# Patient Record
Sex: Male | Born: 1955 | Race: Black or African American | Hispanic: No | Smoking: Never smoker
Health system: Southern US, Community
[De-identification: ages and names within clinical notes are randomized; demographics above are authoritative.]

## PROBLEM LIST (undated history)

## (undated) ENCOUNTER — Emergency Department (HOSPITAL_COMMUNITY): Admission: EM | Payer: Medicaid Other | Source: Home / Self Care

## (undated) DIAGNOSIS — F1010 Alcohol abuse, uncomplicated: Secondary | ICD-10-CM

## (undated) DIAGNOSIS — I10 Essential (primary) hypertension: Secondary | ICD-10-CM

## (undated) DIAGNOSIS — I639 Cerebral infarction, unspecified: Secondary | ICD-10-CM

## (undated) HISTORY — PX: EYE SURGERY: SHX253

---

## 2001-01-12 ENCOUNTER — Emergency Department (HOSPITAL_COMMUNITY): Admission: EM | Admit: 2001-01-12 | Discharge: 2001-01-12 | Payer: Self-pay | Admitting: *Deleted

## 2001-01-21 ENCOUNTER — Encounter (HOSPITAL_COMMUNITY): Admission: RE | Admit: 2001-01-21 | Discharge: 2001-02-20 | Payer: Self-pay | Admitting: General Surgery

## 2003-08-24 ENCOUNTER — Inpatient Hospital Stay (HOSPITAL_COMMUNITY): Admission: EM | Admit: 2003-08-24 | Discharge: 2003-08-28 | Payer: Self-pay | Admitting: Emergency Medicine

## 2003-08-30 ENCOUNTER — Encounter (HOSPITAL_COMMUNITY): Admission: RE | Admit: 2003-08-30 | Discharge: 2003-09-29 | Payer: Self-pay | Admitting: Internal Medicine

## 2003-10-02 ENCOUNTER — Encounter (HOSPITAL_COMMUNITY): Admission: RE | Admit: 2003-10-02 | Discharge: 2003-11-01 | Payer: Self-pay | Admitting: Internal Medicine

## 2006-01-16 ENCOUNTER — Inpatient Hospital Stay (HOSPITAL_COMMUNITY): Admission: EM | Admit: 2006-01-16 | Discharge: 2006-01-20 | Payer: Self-pay | Admitting: Emergency Medicine

## 2006-04-11 ENCOUNTER — Emergency Department (HOSPITAL_COMMUNITY): Admission: EM | Admit: 2006-04-11 | Discharge: 2006-04-12 | Payer: Self-pay | Admitting: Emergency Medicine

## 2014-07-14 ENCOUNTER — Encounter (HOSPITAL_COMMUNITY): Payer: Self-pay | Admitting: *Deleted

## 2014-07-14 ENCOUNTER — Emergency Department (HOSPITAL_COMMUNITY)
Admission: EM | Admit: 2014-07-14 | Discharge: 2014-07-14 | Disposition: A | Discharge status: Home / Self Care | Payer: Medicaid Other | Attending: Emergency Medicine | Admitting: Emergency Medicine

## 2014-07-14 DIAGNOSIS — L84 Corns and callosities: Secondary | ICD-10-CM | POA: Diagnosis not present

## 2014-07-14 DIAGNOSIS — Z720 Tobacco use: Secondary | ICD-10-CM | POA: Insufficient documentation

## 2014-07-14 DIAGNOSIS — M79671 Pain in right foot: Secondary | ICD-10-CM | POA: Diagnosis present

## 2014-07-14 DIAGNOSIS — I10 Essential (primary) hypertension: Secondary | ICD-10-CM | POA: Diagnosis not present

## 2014-07-14 HISTORY — DX: Essential (primary) hypertension: I10

## 2014-07-14 LAB — CBG MONITORING, ED: GLUCOSE-CAPILLARY: 92 mg/dL (ref 70–99)

## 2014-07-14 NOTE — ED Notes (Signed)
Patient given discharge instruction, verbalized understand. Patient ambulatory out of the department.  

## 2014-07-14 NOTE — ED Notes (Addendum)
Pain rt  Foot for 2-3 weeks

## 2014-07-14 NOTE — Discharge Instructions (Signed)
Corns and Calluses Corns are small areas of thickened skin that usually occur on the top, sides, or tip of a toe. They contain a cone-shaped core with a point that can press on a nerve below. This causes pain. Calluses are areas of thickened skin that usually develop on hands, fingers, palms, soles of the feet, and heels. These are areas that experience frequent friction or pressure. CAUSES  Corns are usually the result of rubbing (friction) or pressure from shoes that are too tight or do not fit properly. Calluses are caused by repeated friction and pressure on the affected areas. SYMPTOMS  A hard growth on the skin.  Pain or tenderness under the skin.  Sometimes, redness and swelling.  Increased discomfort while wearing tight-fitting shoes. DIAGNOSIS  Your caregiver can usually tell what the problem is by doing a physical exam. TREATMENT  Removing the cause of the friction or pressure is usually the only treatment needed. However, sometimes medicines can be used to help soften the hardened, thickened areas. These medicines include salicylic acid plasters and 12% ammonium lactate lotion. These medicines should only be used under the direction of your caregiver. HOME CARE INSTRUCTIONS   Try to remove pressure from the affected area.  You may wear donut-shaped corn pads to protect your skin.  You may use a pumice stone or nonmetallic nail file to gently reduce the thickness of a corn.  Wear properly fitted footwear.  If you have calluses on the hands, wear gloves during activities that cause friction.  If you have diabetes, you should regularly examine your feet. Tell your caregiver if you notice any problems with your feet. SEEK IMMEDIATE MEDICAL CARE IF:   You have increased pain, swelling, redness, or warmth in the affected area.  Your corn or callus starts to drain fluid or bleeds.  You are not getting better, even with treatment. Document Released: 12/29/2003 Document  Revised: 06/16/2011 Document Reviewed: 11/19/2010 Exit[employer] Patient Information 2015 [employer]. This information is not intended to replace advice given to you by your health care provider. Make sure you discuss any questions you have with your health care provider.  

## 2014-07-16 NOTE — ED Provider Notes (Signed)
CSN: [id number]     Arrival date & time 07/14/14  1717 History   First MD Initiated Contact with Patient 07/14/14 1739     Chief Complaint  Patient presents with  . Foot Pain     (Consider location/radiation/quality/duration/timing/severity/associated sxs/prior Treatment) HPI   Gregory Parker is a 59 y.o. male who presents to the Emergency Department complaining of painful areas to both feet for several weeks.  He states the pain to the right foot is the worse and reports a "knot" on his foot that causes pain with weight bearing.  He states the pain is so bad that he has difficulty walking.  He denies trying any therapies for symptom relief, swelling, redness, or numbness of his foot.     Past Medical History  Diagnosis Date  . Hypertension    Past Surgical History  Procedure Laterality Date  . Eye surgery     History reviewed. No pertinent family history. History  Substance Use Topics  . Smoking status: Current Every Day Smoker  . Smokeless tobacco: Not on file  . Alcohol Use: Yes    Review of Systems  Constitutional: Negative for fever and chills.  Genitourinary: Negative for dysuria and difficulty urinating.  Musculoskeletal: Positive for arthralgias (bilateral foot pain). Negative for joint swelling.  Skin: Negative for color change and wound.  All other systems reviewed and are negative.     Allergies  Review of patient's allergies indicates not on file.  Home Medications   Prior to Admission medications   Not on File   BP 163/91 mmHg  Pulse 73  Temp(Src) 98.1 F (36.7 C) (Oral)  Resp 20  Wt 122 lb (55.339 kg)  SpO2 100% Physical Exam  Constitutional: He is oriented to person, place, and time. He appears well-developed and well-nourished. No distress.  HENT:  Head: Normocephalic and atraumatic.  Cardiovascular: Normal rate, regular rhythm, normal heart sounds and intact distal pulses.   Pulmonary/Chest: Effort normal and breath sounds normal.   Musculoskeletal: He exhibits tenderness.  hyperkertotic lesions to plantar surface of both feet. Right foot has a protruding core that appears very chronic. ROM is preserved.  DP pulse is brisk,distal sensation intact.  No erythema, abrasion, bruising or bony deformity.  No proximal tenderness.  Neurological: He is alert and oriented to person, place, and time. He exhibits normal muscle tone. Coordination normal.  Skin: Skin is warm and dry.  Nursing note and vitals reviewed.   ED Course  Procedures (including critical care time) Labs Review Labs Reviewed  CBG MONITORING, ED    Imaging Review No results found.   EKG Interpretation None      MDM   Final diagnoses:  Callus of foot   Patient with a chronic appearing callus to the plantar foot.  5mm hyperkeratotic lesion with a protruding central core.  No clinical signs of infection.  No open wounds to the foot.    Lesion was filed down by me using surgical scissors.  Pain improved.  Advised pt to use moleskin or callus pads.  Given referral for podiatry.       Severiano Gilbertammi Argel Pablo, PA-C 07/16/14 1802  Lorre NickAnthony Allen, MD 07/17/14 [phone]

## 2015-06-04 ENCOUNTER — Inpatient Hospital Stay (HOSPITAL_COMMUNITY): Payer: Medicare Other

## 2015-06-04 ENCOUNTER — Emergency Department (HOSPITAL_COMMUNITY): Payer: Medicare Other

## 2015-06-04 ENCOUNTER — Inpatient Hospital Stay (HOSPITAL_COMMUNITY)
Admission: EM | Admit: 2015-06-04 | Discharge: 2015-07-12 | DRG: 004 | Disposition: A | Discharge status: Skilled Nursing Facility | Payer: Medicare Other | Attending: Internal Medicine | Admitting: Internal Medicine

## 2015-06-04 ENCOUNTER — Encounter (HOSPITAL_COMMUNITY): Payer: Self-pay

## 2015-06-04 DIAGNOSIS — R402431 Glasgow coma scale score 3-8, in the field [EMT or ambulance]: Secondary | ICD-10-CM | POA: Diagnosis not present

## 2015-06-04 DIAGNOSIS — R4020 Unspecified coma: Secondary | ICD-10-CM | POA: Diagnosis not present

## 2015-06-04 DIAGNOSIS — I110 Hypertensive heart disease with heart failure: Secondary | ICD-10-CM | POA: Diagnosis not present

## 2015-06-04 DIAGNOSIS — K5900 Constipation, unspecified: Secondary | ICD-10-CM | POA: Diagnosis not present

## 2015-06-04 DIAGNOSIS — I10 Essential (primary) hypertension: Secondary | ICD-10-CM | POA: Diagnosis not present

## 2015-06-04 DIAGNOSIS — J9690 Respiratory failure, unspecified, unspecified whether with hypoxia or hypercapnia: Secondary | ICD-10-CM

## 2015-06-04 DIAGNOSIS — R32 Unspecified urinary incontinence: Secondary | ICD-10-CM | POA: Diagnosis not present

## 2015-06-04 DIAGNOSIS — R7989 Other specified abnormal findings of blood chemistry: Secondary | ICD-10-CM

## 2015-06-04 DIAGNOSIS — J9600 Acute respiratory failure, unspecified whether with hypoxia or hypercapnia: Secondary | ICD-10-CM | POA: Diagnosis not present

## 2015-06-04 DIAGNOSIS — R569 Unspecified convulsions: Secondary | ICD-10-CM | POA: Diagnosis present

## 2015-06-04 DIAGNOSIS — R1310 Dysphagia, unspecified: Secondary | ICD-10-CM | POA: Diagnosis present

## 2015-06-04 DIAGNOSIS — F1010 Alcohol abuse, uncomplicated: Secondary | ICD-10-CM | POA: Diagnosis not present

## 2015-06-04 DIAGNOSIS — E873 Alkalosis: Secondary | ICD-10-CM | POA: Diagnosis not present

## 2015-06-04 DIAGNOSIS — R000 Tachycardia, unspecified: Secondary | ICD-10-CM | POA: Diagnosis not present

## 2015-06-04 DIAGNOSIS — I6789 Other cerebrovascular disease: Secondary | ICD-10-CM | POA: Diagnosis not present

## 2015-06-04 DIAGNOSIS — G9340 Encephalopathy, unspecified: Secondary | ICD-10-CM | POA: Diagnosis present

## 2015-06-04 DIAGNOSIS — J13 Pneumonia due to Streptococcus pneumoniae: Secondary | ICD-10-CM | POA: Diagnosis not present

## 2015-06-04 DIAGNOSIS — Z01818 Encounter for other preprocedural examination: Secondary | ICD-10-CM | POA: Diagnosis present

## 2015-06-04 DIAGNOSIS — Z681 Body mass index (BMI) 19 or less, adult: Secondary | ICD-10-CM

## 2015-06-04 DIAGNOSIS — G40901 Epilepsy, unspecified, not intractable, with status epilepticus: Secondary | ICD-10-CM | POA: Diagnosis present

## 2015-06-04 DIAGNOSIS — R509 Fever, unspecified: Secondary | ICD-10-CM

## 2015-06-04 DIAGNOSIS — T883XXA Malignant hyperthermia due to anesthesia, initial encounter: Secondary | ICD-10-CM | POA: Diagnosis not present

## 2015-06-04 DIAGNOSIS — D638 Anemia in other chronic diseases classified elsewhere: Secondary | ICD-10-CM | POA: Diagnosis not present

## 2015-06-04 DIAGNOSIS — M6282 Rhabdomyolysis: Secondary | ICD-10-CM | POA: Diagnosis not present

## 2015-06-04 DIAGNOSIS — G210 Malignant neuroleptic syndrome: Secondary | ICD-10-CM | POA: Diagnosis not present

## 2015-06-04 DIAGNOSIS — E870 Hyperosmolality and hypernatremia: Secondary | ICD-10-CM | POA: Diagnosis not present

## 2015-06-04 DIAGNOSIS — R29810 Facial weakness: Secondary | ICD-10-CM | POA: Diagnosis not present

## 2015-06-04 DIAGNOSIS — I6330 Cerebral infarction due to thrombosis of unspecified cerebral artery: Secondary | ICD-10-CM | POA: Diagnosis not present

## 2015-06-04 DIAGNOSIS — E876 Hypokalemia: Secondary | ICD-10-CM | POA: Diagnosis not present

## 2015-06-04 DIAGNOSIS — Z8673 Personal history of transient ischemic attack (TIA), and cerebral infarction without residual deficits: Secondary | ICD-10-CM

## 2015-06-04 DIAGNOSIS — R34 Anuria and oliguria: Secondary | ICD-10-CM | POA: Diagnosis not present

## 2015-06-04 DIAGNOSIS — Z931 Gastrostomy status: Secondary | ICD-10-CM | POA: Diagnosis present

## 2015-06-04 DIAGNOSIS — F1410 Cocaine abuse, uncomplicated: Secondary | ICD-10-CM | POA: Diagnosis present

## 2015-06-04 DIAGNOSIS — J811 Chronic pulmonary edema: Secondary | ICD-10-CM

## 2015-06-04 DIAGNOSIS — Z7982 Long term (current) use of aspirin: Secondary | ICD-10-CM | POA: Diagnosis not present

## 2015-06-04 DIAGNOSIS — R0682 Tachypnea, not elsewhere classified: Secondary | ICD-10-CM | POA: Diagnosis not present

## 2015-06-04 DIAGNOSIS — T17908A Unspecified foreign body in respiratory tract, part unspecified causing other injury, initial encounter: Secondary | ICD-10-CM

## 2015-06-04 DIAGNOSIS — G039 Meningitis, unspecified: Secondary | ICD-10-CM | POA: Diagnosis not present

## 2015-06-04 DIAGNOSIS — T783XXA Angioneurotic edema, initial encounter: Secondary | ICD-10-CM | POA: Diagnosis not present

## 2015-06-04 DIAGNOSIS — R945 Abnormal results of liver function studies: Secondary | ICD-10-CM

## 2015-06-04 DIAGNOSIS — Z789 Other specified health status: Secondary | ICD-10-CM

## 2015-06-04 DIAGNOSIS — I63032 Cerebral infarction due to thrombosis of left carotid artery: Secondary | ICD-10-CM | POA: Diagnosis not present

## 2015-06-04 DIAGNOSIS — J189 Pneumonia, unspecified organism: Secondary | ICD-10-CM | POA: Diagnosis not present

## 2015-06-04 DIAGNOSIS — I6300 Cerebral infarction due to thrombosis of unspecified precerebral artery: Secondary | ICD-10-CM | POA: Diagnosis present

## 2015-06-04 DIAGNOSIS — R401 Stupor: Secondary | ICD-10-CM | POA: Diagnosis not present

## 2015-06-04 DIAGNOSIS — F10231 Alcohol dependence with withdrawal delirium: Secondary | ICD-10-CM | POA: Diagnosis not present

## 2015-06-04 DIAGNOSIS — E43 Unspecified severe protein-calorie malnutrition: Secondary | ICD-10-CM | POA: Diagnosis not present

## 2015-06-04 DIAGNOSIS — E46 Unspecified protein-calorie malnutrition: Secondary | ICD-10-CM | POA: Diagnosis not present

## 2015-06-04 DIAGNOSIS — R778 Other specified abnormalities of plasma proteins: Secondary | ICD-10-CM | POA: Diagnosis not present

## 2015-06-04 DIAGNOSIS — R0603 Acute respiratory distress: Secondary | ICD-10-CM

## 2015-06-04 DIAGNOSIS — Z930 Tracheostomy status: Secondary | ICD-10-CM | POA: Diagnosis not present

## 2015-06-04 DIAGNOSIS — Z4659 Encounter for fitting and adjustment of other gastrointestinal appliance and device: Secondary | ICD-10-CM

## 2015-06-04 DIAGNOSIS — J9601 Acute respiratory failure with hypoxia: Secondary | ICD-10-CM | POA: Diagnosis present

## 2015-06-04 DIAGNOSIS — I639 Cerebral infarction, unspecified: Secondary | ICD-10-CM | POA: Diagnosis not present

## 2015-06-04 DIAGNOSIS — J9602 Acute respiratory failure with hypercapnia: Secondary | ICD-10-CM | POA: Diagnosis not present

## 2015-06-04 DIAGNOSIS — K5909 Other constipation: Secondary | ICD-10-CM | POA: Diagnosis not present

## 2015-06-04 DIAGNOSIS — J690 Pneumonitis due to inhalation of food and vomit: Secondary | ICD-10-CM | POA: Diagnosis not present

## 2015-06-04 DIAGNOSIS — I5033 Acute on chronic diastolic (congestive) heart failure: Secondary | ICD-10-CM | POA: Diagnosis present

## 2015-06-04 DIAGNOSIS — Z23 Encounter for immunization: Secondary | ICD-10-CM | POA: Diagnosis not present

## 2015-06-04 DIAGNOSIS — Z79899 Other long term (current) drug therapy: Secondary | ICD-10-CM | POA: Diagnosis not present

## 2015-06-04 DIAGNOSIS — Z430 Encounter for attention to tracheostomy: Secondary | ICD-10-CM | POA: Diagnosis present

## 2015-06-04 DIAGNOSIS — F10230 Alcohol dependence with withdrawal, uncomplicated: Secondary | ICD-10-CM | POA: Diagnosis not present

## 2015-06-04 DIAGNOSIS — F10239 Alcohol dependence with withdrawal, unspecified: Secondary | ICD-10-CM | POA: Diagnosis present

## 2015-06-04 DIAGNOSIS — F10939 Alcohol use, unspecified with withdrawal, unspecified: Secondary | ICD-10-CM | POA: Diagnosis present

## 2015-06-04 DIAGNOSIS — Z9289 Personal history of other medical treatment: Secondary | ICD-10-CM

## 2015-06-04 HISTORY — DX: Cerebral infarction, unspecified: I63.9

## 2015-06-04 HISTORY — DX: Alcohol abuse, uncomplicated: F10.10

## 2015-06-04 LAB — BLOOD GAS, ARTERIAL
ACID-BASE DEFICIT: 0.2 mmol/L (ref 0.0–2.0)
Bicarbonate: 24.9 mEq/L — ABNORMAL HIGH (ref 20.0–24.0)
Drawn by: 23534
FIO2: 0.5
MECHVT: 500 mL
O2 Content: 50 L/min
O2 Saturation: 94.4 %
PEEP/CPAP: 5 cmH2O
RATE: 14 resp/min
pCO2 arterial: 32 mmHg — ABNORMAL LOW (ref 35.0–45.0)
pH, Arterial: 7.471 — ABNORMAL HIGH (ref 7.350–7.450)
pO2, Arterial: 72.8 mmHg — ABNORMAL LOW (ref 80.0–100.0)

## 2015-06-04 LAB — COMPREHENSIVE METABOLIC PANEL
ALT: 22 U/L (ref 17–63)
ANION GAP: 10 (ref 5–15)
AST: 27 U/L (ref 15–41)
Albumin: 4.6 g/dL (ref 3.5–5.0)
Alkaline Phosphatase: 75 U/L (ref 38–126)
BILIRUBIN TOTAL: 0.7 mg/dL (ref 0.3–1.2)
BUN: 11 mg/dL (ref 6–20)
CO2: 26 mmol/L (ref 22–32)
Calcium: 10.1 mg/dL (ref 8.9–10.3)
Chloride: 103 mmol/L (ref 101–111)
Creatinine, Ser: 0.93 mg/dL (ref 0.61–1.24)
Glucose, Bld: 102 mg/dL — ABNORMAL HIGH (ref 65–99)
POTASSIUM: 3.8 mmol/L (ref 3.5–5.1)
Sodium: 139 mmol/L (ref 135–145)
TOTAL PROTEIN: 9.6 g/dL — AB (ref 6.5–8.1)

## 2015-06-04 LAB — CBC WITH DIFFERENTIAL/PLATELET
Basophils Absolute: 0 10*3/uL (ref 0.0–0.1)
Basophils Relative: 0 %
EOS PCT: 4 %
Eosinophils Absolute: 0.2 10*3/uL (ref 0.0–0.7)
HEMATOCRIT: 44.4 % (ref 39.0–52.0)
Hemoglobin: 15.7 g/dL (ref 13.0–17.0)
LYMPHS PCT: 25 %
Lymphs Abs: 1.6 10*3/uL (ref 0.7–4.0)
MCH: 31.8 pg (ref 26.0–34.0)
MCHC: 35.4 g/dL (ref 30.0–36.0)
MCV: 90.1 fL (ref 78.0–100.0)
MONO ABS: 0.3 10*3/uL (ref 0.1–1.0)
MONOS PCT: 4 %
NEUTROS ABS: 4.2 10*3/uL (ref 1.7–7.7)
Neutrophils Relative %: 67 %
PLATELETS: 182 10*3/uL (ref 150–400)
RBC: 4.93 MIL/uL (ref 4.22–5.81)
RDW: 13.1 % (ref 11.5–15.5)
WBC: 6.2 10*3/uL (ref 4.0–10.5)

## 2015-06-04 LAB — I-STAT CG4 LACTIC ACID, ED: LACTIC ACID, VENOUS: 1.24 mmol/L (ref 0.5–2.0)

## 2015-06-04 LAB — PROTIME-INR
INR: 0.97 (ref 0.00–1.49)
Prothrombin Time: 13.1 seconds (ref 11.6–15.2)

## 2015-06-04 LAB — I-STAT CHEM 8, ED
BUN: 11 mg/dL (ref 6–20)
CREATININE: 0.9 mg/dL (ref 0.61–1.24)
Calcium, Ion: 1.17 mmol/L (ref 1.13–1.30)
Chloride: 102 mmol/L (ref 101–111)
Glucose, Bld: 97 mg/dL (ref 65–99)
HEMATOCRIT: 51 % (ref 39.0–52.0)
Hemoglobin: 17.3 g/dL — ABNORMAL HIGH (ref 13.0–17.0)
POTASSIUM: 3.8 mmol/L (ref 3.5–5.1)
Sodium: 142 mmol/L (ref 135–145)
TCO2: 25 mmol/L (ref 0–100)

## 2015-06-04 LAB — CBG MONITORING, ED: GLUCOSE-CAPILLARY: 90 mg/dL (ref 65–99)

## 2015-06-04 LAB — RAPID URINE DRUG SCREEN, HOSP PERFORMED
Amphetamines: NOT DETECTED
BENZODIAZEPINES: NOT DETECTED
Barbiturates: NOT DETECTED
COCAINE: NOT DETECTED
OPIATES: NOT DETECTED
Tetrahydrocannabinol: NOT DETECTED

## 2015-06-04 LAB — ETHANOL

## 2015-06-04 LAB — URINE MICROSCOPIC-ADD ON

## 2015-06-04 LAB — URINALYSIS, ROUTINE W REFLEX MICROSCOPIC
Bilirubin Urine: NEGATIVE
Glucose, UA: NEGATIVE mg/dL
KETONES UR: NEGATIVE mg/dL
LEUKOCYTES UA: NEGATIVE
NITRITE: NEGATIVE
PH: 6.5 (ref 5.0–8.0)
Protein, ur: NEGATIVE mg/dL
SPECIFIC GRAVITY, URINE: 1.015 (ref 1.005–1.030)

## 2015-06-04 LAB — TRIGLYCERIDES: Triglycerides: 69 mg/dL (ref <150)

## 2015-06-04 LAB — TROPONIN I: Troponin I: 0.04 ng/mL — ABNORMAL HIGH (ref <0.031)

## 2015-06-04 LAB — ACETAMINOPHEN LEVEL

## 2015-06-04 LAB — SALICYLATE LEVEL: Salicylate Lvl: <4 mg/dL (ref 2.8–30.0)

## 2015-06-04 LAB — LACTIC ACID, PLASMA: LACTIC ACID, VENOUS: 1.3 mmol/L (ref 0.5–2.0)

## 2015-06-04 LAB — CK: CK TOTAL: 279 U/L (ref 49–397)

## 2015-06-04 LAB — GLUCOSE, CAPILLARY: GLUCOSE-CAPILLARY: 128 mg/dL — AB (ref 65–99)

## 2015-06-04 LAB — MRSA PCR SCREENING: MRSA BY PCR: NEGATIVE

## 2015-06-04 MED ORDER — SODIUM CHLORIDE 0.9 % IV SOLN
Freq: Once | INTRAVENOUS | Status: AC
  Administered 2015-06-04: 20:00:00 via INTRAVENOUS

## 2015-06-04 MED ORDER — SODIUM CHLORIDE 0.9 % IV SOLN
500.0000 mg | Freq: Two times a day (BID) | INTRAVENOUS | Status: DC
  Filled 2015-06-04: qty 5

## 2015-06-04 MED ORDER — THIAMINE HCL 100 MG/ML IJ SOLN
100.0000 mg | Freq: Every day | INTRAMUSCULAR | Status: DC
Start: 2015-06-05 — End: 2015-06-17
  Administered 2015-06-05 – 2015-06-16 (×12): 100 mg via INTRAVENOUS
  Filled 2015-06-04 (×12): qty 2

## 2015-06-04 MED ORDER — HEPARIN SODIUM (PORCINE) 5000 UNIT/ML IJ SOLN
5000.0000 [IU] | Freq: Three times a day (TID) | INTRAMUSCULAR | Status: DC
  Administered 2015-06-05 – 2015-06-22 (×50): 5000 [IU] via SUBCUTANEOUS
  Filled 2015-06-04 (×50): qty 1

## 2015-06-04 MED ORDER — HYDRALAZINE HCL 20 MG/ML IJ SOLN
10.0000 mg | Freq: Once | INTRAMUSCULAR | Status: AC
  Administered 2015-06-04: 10 mg via INTRAVENOUS
  Filled 2015-06-04: qty 1

## 2015-06-04 MED ORDER — LORAZEPAM 2 MG/ML IJ SOLN
1.0000 mg | Freq: Once | INTRAMUSCULAR | Status: AC
  Administered 2015-06-04: 1 mg via INTRAVENOUS
  Filled 2015-06-04: qty 1

## 2015-06-04 MED ORDER — PANTOPRAZOLE SODIUM 40 MG IV SOLR
40.0000 mg | Freq: Every day | INTRAVENOUS | Status: DC
  Administered 2015-06-05 – 2015-06-11 (×7): 40 mg via INTRAVENOUS
  Filled 2015-06-04 (×7): qty 40

## 2015-06-04 MED ORDER — SODIUM CHLORIDE 0.9 % IV BOLUS (SEPSIS)
1000.0000 mL | Freq: Once | INTRAVENOUS | Status: AC
Start: 2015-06-04 — End: 2015-06-04
  Administered 2015-06-04: 1000 mL via INTRAVENOUS

## 2015-06-04 MED ORDER — SODIUM CHLORIDE 0.9 % IV SOLN
250.0000 mL | INTRAVENOUS | Status: DC | PRN
  Administered 2015-06-05: 250 mL via INTRAVENOUS

## 2015-06-04 MED ORDER — SODIUM CHLORIDE 0.9 % IV SOLN
INTRAVENOUS | Status: DC
  Administered 2015-06-05: 01:00:00 via INTRAVENOUS

## 2015-06-04 MED ORDER — FOLIC ACID 5 MG/ML IJ SOLN
1.0000 mg | Freq: Every day | INTRAMUSCULAR | Status: DC
  Administered 2015-06-05 – 2015-06-16 (×12): 1 mg via INTRAVENOUS
  Filled 2015-06-04 (×14): qty 0.2

## 2015-06-04 MED ORDER — LABETALOL HCL 5 MG/ML IV SOLN
20.0000 mg | Freq: Once | INTRAVENOUS | Status: AC
  Administered 2015-06-04: 20 mg via INTRAVENOUS
  Filled 2015-06-04: qty 4

## 2015-06-04 MED ORDER — MIDAZOLAM HCL 2 MG/2ML IJ SOLN
2.0000 mg | INTRAMUSCULAR | Status: DC | PRN
  Administered 2015-06-05: 2 mg via INTRAVENOUS
  Filled 2015-06-04 (×6): qty 2

## 2015-06-04 MED ORDER — LEVETIRACETAM IN NACL 1000 MG/100ML IV SOLN
1000.0000 mg | Freq: Once | INTRAVENOUS | Status: AC
  Administered 2015-06-04: 1000 mg via INTRAVENOUS
  Filled 2015-06-04: qty 100

## 2015-06-04 MED ORDER — MIDAZOLAM HCL 2 MG/2ML IJ SOLN
INTRAMUSCULAR | Status: AC
  Administered 2015-06-04: 2 mg
  Filled 2015-06-04: qty 2

## 2015-06-04 MED ORDER — LORAZEPAM 2 MG/ML IJ SOLN
2.0000 mg | Freq: Once | INTRAMUSCULAR | Status: AC
  Administered 2015-06-04: 2 mg via INTRAVENOUS
  Filled 2015-06-04: qty 1

## 2015-06-04 MED ORDER — MIDAZOLAM HCL 5 MG/ML IJ SOLN: 2.0000 mg | INTRAMUSCULAR | Status: DC | PRN

## 2015-06-04 MED ORDER — MIDAZOLAM HCL 2 MG/2ML IJ SOLN
2.0000 mg | INTRAMUSCULAR | Status: DC | PRN
  Administered 2015-06-07 – 2015-06-23 (×32): 2 mg via INTRAVENOUS
  Filled 2015-06-04 (×27): qty 2

## 2015-06-04 MED ORDER — LORAZEPAM 2 MG/ML IJ SOLN
INTRAMUSCULAR | Status: AC
  Administered 2015-06-04: 1 mg via INTRAVENOUS
  Filled 2015-06-04: qty 1

## 2015-06-04 MED ORDER — PROPOFOL 1000 MG/100ML IV EMUL: 0.0000 ug/kg/min | INTRAVENOUS | Status: DC

## 2015-06-04 MED ORDER — PROPOFOL 1000 MG/100ML IV EMUL
5.0000 ug/kg/min | INTRAVENOUS | Status: DC
  Administered 2015-06-06: 30 ug/kg/min via INTRAVENOUS
  Administered 2015-06-06: 65.617 ug/kg/min via INTRAVENOUS
  Administered 2015-06-07 (×2): 25 ug/kg/min via INTRAVENOUS
  Administered 2015-06-07: 35 ug/kg/min via INTRAVENOUS
  Filled 2015-06-04 (×8): qty 100

## 2015-06-04 MED ORDER — PROPOFOL 1000 MG/100ML IV EMUL
INTRAVENOUS | Status: AC
  Filled 2015-06-04: qty 100

## 2015-06-04 NOTE — Progress Notes (Addendum)
Intermittent tremors noted to L leg and L arm. Dr. Dellie Catholic notified. Orders received for Versed  at this time.  Storm Frisk at bedside and aware.

## 2015-06-04 NOTE — H&P (Signed)
PULMONARY / CRITICAL CARE MEDICINE   Name: Gregory Parker MRN: [id number] DOB: Sep 19, 1955    ADMISSION DATE:  06/04/2015 CONSULTATION DATE:  06/04/2015  REFERRING MD:  EDP at [facility]  CHIEF COMPLAINT:  AMS  HISTORY OF PRESENT ILLNESS:  Patient is encephalopathic, therefore; HPI obtained from chart review. 60 year old male with PMH as below, which is significant for HTN, ETOH, and CVA. 2/27 EMS was dispatched to the patient's home due to reports of shaking episodes with decreased responsiveness. He has a history of ETOH abuse and family reports that last drink was today, and he has only had one drink today. Family also reports that he may have smoked crack over the weekend. He was last seen normal 430 PM. In the emergency department, staff noted urinary incontinence, leftward gaze deviation, R facial droop, and posturing on the L side. He then proceeded to have seizure like activity with limited airway protection, and thus was intubated by EDP. He had a CT of the head which showed, no acute abnormality, but remote basal ganglia infarcts. He was loaded with 1G IV Keppra and started on propofol for sedation/seizure management. He was transferred to [facility] for ICU admission and neurology evaluation.   PAST MEDICAL HISTORY :  He  has a past medical history of Alcohol abuse and Stroke (HCC).  PAST SURGICAL HISTORY: He  has no past surgical history on file.  No Known Allergies  No current facility-administered medications on file prior to encounter.   No current outpatient prescriptions on file prior to encounter.    FAMILY HISTORY:  His has no family status information on file.   SOCIAL HISTORY: He  reports that he drinks alcohol. He reports that he uses illicit drugs.  REVIEW OF SYSTEMS:   Unable as patient is encephalopathic and endotracheally intubated.  SUBJECTIVE:  On vent.  Has intermittent twitching of LUE and LLE.  Neuro consult pending.  VITAL SIGNS: BP 180/116 mmHg   Pulse 117  Temp(Src) 97.7 F (36.5 C) (Rectal)  Resp 19  Ht  (1.651 m)  SpO2 98%  HEMODYNAMICS:    VENTILATOR SETTINGS: Vent Mode:  [-] PRVC FiO2 (%):  [50 %] 50 % Set Rate:  [14 bmp] 14 bmp Vt Set:  [500 mL] 500 mL PEEP:  [5 cmH20] 5 cmH20 Plateau Pressure:  [13 cmH20-21 cmH20] 21 cmH20  INTAKE / OUTPUT:    PHYSICAL EXAMINATION: General: AA male, in NAD. Neuro: Sedated, non-responsive.   HEENT: [place]/AT. PERRL, sclerae anicteric. Cardiovascular: Tachy, regular, no M/R/G.  Lungs: Respirations even and unlabored.  Coarse bilaterally. Abdomen: BS x 4, soft, NT/ND.  Musculoskeletal: No gross deformities, no edema.  Skin: Intact, warm, no rashes.    LABS:  BMET  Recent Labs Lab 06/04/15 1753 06/04/15 1758  NA 139 142  K 3.8 3.8  CL 103 102  CO2 26  --   BUN 11 11  CREATININE 0.93 0.90  GLUCOSE 102* 97    Electrolytes  Recent Labs Lab 06/04/15 1753  CALCIUM 10.1    CBC  Recent Labs Lab 06/04/15 1753 06/04/15 1758  WBC 6.2  --   HGB 15.7 17.3*  HCT 44.4 51.0  PLT 182  --     Coag's  Recent Labs Lab 06/04/15 1753  INR 0.97    Sepsis Markers  Recent Labs Lab 06/04/15 1800  LATICACIDVEN 1.24    ABG  Recent Labs Lab 06/04/15 1855  PHART 7.471*  PCO2ART 32.0*  PO2ART 72.8*  Liver Enzymes  Recent Labs Lab 06/04/15 1753  AST 27  ALT 22  ALKPHOS 75  BILITOT 0.7  ALBUMIN 4.6    Cardiac Enzymes No results for input(s): TROPONINI, PROBNP in the last 168 hours.  Glucose  Recent Labs Lab 06/04/15 1749  GLUCAP 90    Imaging Ct Head Wo Contrast  06/04/2015  CLINICAL DATA:  Altered mental status. Shaking and decreased responsiveness. History of alcohol abuse and stroke. EXAM: CT HEAD WITHOUT CONTRAST TECHNIQUE: Contiguous axial images were obtained from the base of the skull through the vertex without intravenous contrast. COMPARISON:  None. FINDINGS: Sinuses/Soft tissues: Surgical changes about the left maxillary  sinus and zygoma, incompletely imaged. Apparent soft tissue fullness within the nasopharynx, including image 1/series 3. Hypoplastic frontal sinuses. Other paranasal sinuses and mastoid air cells are unremarkable. Intracranial: Age advanced cerebellar atrophy. Remote lacunar infarcts involving the left lateral basal ganglia, right thalamus and caudate. No mass lesion, hemorrhage, hydrocephalus, acute infarct, intra-axial, or extra-axial fluid collection. IMPRESSION: 1.  No acute intracranial abnormality. 2. Remote bilateral basal ganglia lacunar infarcts. 3. Apparent soft tissue fullness in the nasopharynx could relate to retained secretions, especially given absence of mastoid effusions. Consider physical exam correlation to exclude unlikely soft tissue mass in this area. Electronically Signed   By: Jeronimo Greaves M.D.   On: 06/04/2015 18:19   Dg Chest Portable 1 View  06/04/2015  CLINICAL DATA:  60 year old male with altered mental status. ET tube placement. EXAM: PORTABLE CHEST 1 VIEW COMPARISON:  None. FINDINGS: An endotracheal tube is noted with tip at the origin of the right mainstem bronchus. Recommend retraction and repositioning of tube by approximately 5-6 cm. An enteric tube is partially visualized coursing to the left upper abdomen. Tip of the enteric tube is beyond the image margin. Single-view of the chest demonstrate mild increased interstitial densities in the left lower lung field. No focal consolidation, pleural effusion, or pneumothorax. The cardiac silhouette is within normal limits. No acute osseous pathology. The degenerative changes of the left shoulder. IMPRESSION: Endotracheal tube in the right mainstem bronchus. Recommend retraction and repositioning by approximately 5-6 cm. Mild left lung base interstitial markings. These results were called by telephone at the time of interpretation on 06/04/2015 at 6:33 pm to Dr. Clayborne Dana, who verbally acknowledged these results. Electronically Signed    By: Elgie Collard M.D.   On: 06/04/2015 18:34   Dg Chest Port 1v Same Day  06/04/2015  CLINICAL DATA:  Reposition of endotracheal tube.  Follow-up study. EXAM: PORTABLE CHEST 1 VIEW COMPARISON:  06/04/2015 at 1804 hours FINDINGS: Endotracheal tube tip has been retracted. Tip now projects 5.5 cm above the carina. Orogastric tube is stable passing below the diaphragm well into the stomach. Prominent interstitial densities the left lung base similar to the prior exam. No new lung abnormalities. No pleural effusion or pneumothorax. IMPRESSION: 1. Endotracheal tube tip now projects 5.5 cm above the carina. No other change from the prior study. Electronically Signed   By: Amie Portland M.D.   On: 06/04/2015 18:58     STUDIES:  CT head 2/27 >  No acute intracranial abnormality. Remote bilateral basal ganglia lacunar infarcts. Apparent soft tissue fullness in the nasopharynx could relate to retained secretions, especially given absence of mastoid effusions.   CULTURES: BCx2 2/27 >  ANTIBIOTICS: None.  SIGNIFICANT EVENTS: 2/27 AMS, ? Seizure, intubated in [facility] ED, tx to [facility] ICU  LINES/TUBES: ETT 2/27 >>>    ASSESSMENT / PLAN:  NEUROLOGIC A:  Seizures with concern for status epilepticus. Hx ETOH and polysubstance abuse (crack cocaine). P:   Sedation: Propofol gtt / Midazolam PRN. RASS goal: 0 to -1. Neuro consulted. Continue Keppra BID. Defer additional AED's to neuro. Thiamine / Folate.  PULMONARY A: Acute hypoxemic respiratory failure secondary to seizures. P:   Full vent support, PRVC, 8cc/kg. Trend ABG. VAP prevention measures. CXR in AM.  CARDIOVASCULAR A:  H/o HTN. P:  Monitor hemodynamics. Repeat troponin.  RENAL A:   No acute issues. P:   NS @ 100. BMP in AM.  GASTROINTESTINAL A:   ETOH abuse. GI prophylaxis. Nutrition. P:   SUP: Pantoprazole. NPO.  HEMATOLOGIC A:   VTE prophylaxis. P:  SCD's / heparin. CBC in AM.  INFECTIOUS A:   No  acute indication of infection. P:   Monitor clinically.  ENDOCRINE A:   No acute issues.   P:   No interventions required.   FAMILY  - Updates: None.  - Inter-disciplinary family meet or Palliative Care meeting due by: 3/6  CC time: 35 minutes.   Rutherford Guys, [place] - [facility] Pager: [phone]  or [phone] 06/04/2015, 10:00 PM

## 2015-06-04 NOTE — ED Notes (Signed)
Intubation:  20 mg Etomidate given 5:53 PM 100 mg Rocuronium given 5:53 PM both given by Vernell Barrier, RN  Dr Manus Gunning at bedside for intubation. Patient intubated with 7.5 mm ETT 5:56 PM by Dr Manus Gunning. Positive color change on CO2 detector. 23 cm at lip. Bilateral breath sounds noted by MD.

## 2015-06-04 NOTE — ED Notes (Addendum)
EMS reports was called out to pt's residence because pt is having episodes of shaking and decreased responsiveness.  Reports when they arrived they found that pt will  Have episodes of shaking and unresponsiveness then pt will begin to talk when the shaking stops.  EMS says pt drinks heavily daily and has only had 1 alcoholic beverage today.  Pt nonverbal at this time but will open eyes when aroused.  EMS reports pt's last seen  Normal was 4:30

## 2015-06-04 NOTE — Progress Notes (Signed)
1740 call from ER staying they had someone to be scanned ASAP but not a code stroke 1745 ER called to say they are going to intubate patient first before coming to CT 1747 beeper 1810 on CT table 1814 complete 1815 [facility] Radiology

## 2015-06-04 NOTE — ED Provider Notes (Signed)
CSN: [id number]     Arrival date & time 06/04/15  1724 History   First MD Initiated Contact with Patient 06/04/15 1734     Chief Complaint  Patient presents with  . Altered Mental Status     (Consider location/radiation/quality/duration/timing/severity/associated sxs/prior Treatment) HPI Comments: Level V caveat for altered mental status. EMS called out for decreased responsiveness and shaking episodes. Patient apparently last seen normal about 4:30 PM. Patient with history of alcohol abuse and previous stroke. No family available. On arrival patient is nonverbal. He has a left-sided gaze, incontinent of urine, right facial droop and posturing with left arm. He attempts to follow some commands  Family has arrived after intubation. Patient lives with his niece. He was actually last seen yesterday afternoon. he was normal. They state his only medical problems high blood pressure. He does have a history of alcohol use but does not drink every day and does not have any history of withdrawals. They state he may have used some crack over the weekend. Does not use any blood thinners.  The history is provided by the patient and the EMS personnel. The history is limited by the condition of the patient.    Past Medical History  Diagnosis Date  . Alcohol abuse   . Stroke [facility])    History reviewed. No pertinent past surgical history. No family history on file. Social History  Substance Use Topics  . Smoking status: Unknown If Ever Smoked  . Smokeless tobacco: None  . Alcohol Use: Yes     Comment: heavily    Review of Systems  Unable to perform ROS: Mental status change      Allergies  Review of patient's allergies indicates no known allergies.  Home Medications   Prior to Admission medications   Not on File   BP 205/114 mmHg  Pulse 120  Temp(Src) 98.4 F (36.9 C) (Rectal)  Resp 28  SpO2 99% Physical Exam  Constitutional: He appears well-developed and well-nourished. He  appears distressed.  Nonverbal, posturing with left arm  HENT:  Head: Normocephalic and atraumatic.  Mouth/Throat: Oropharynx is clear and moist. No oropharyngeal exudate.  Eyes: Conjunctivae are normal. Pupils are equal, round, and reactive to light.  L sided gaze, does not track.  Neck: Normal range of motion. Neck supple.  Cardiovascular: Normal heart sounds and intact distal pulses.   No murmur heard. tachycardic  Pulmonary/Chest: Effort normal and breath sounds normal. No respiratory distress. He exhibits no tenderness.  Abdominal: Soft. There is no tenderness. There is no rebound and no guarding.  Genitourinary:  Incontinent of urine  Musculoskeletal: Normal range of motion. He exhibits no edema or tenderness.  No trauma  Neurological: A cranial nerve deficit is present.  Right-sided facial droop, contracture and posturing of left arm. Moves lower extremities spontaneously. Follows commands with right arm. Left-sided gaze    ED Course  .Intubation Date/Time: 06/04/2015 6:05 PM Performed by: Glynn Octave Authorized by: Glynn Octave Consent: The procedure was performed in an emergent situation. Patient identity confirmed: provided demographic data and verbally with patient Time out: Immediately prior to procedure a "time out" was called to verify the correct patient, procedure, equipment, support staff and site/side marked as required. Indications: respiratory failure and  airway protection Intubation method: video-assisted Patient status: paralyzed (RSI) Preoxygenation: nonrebreather mask Sedatives: etomidate Paralytic: rocuronium Laryngoscope size: Mac 4 Tube size: 7.5 mm Tube type: cuffed Cricoid pressure: no Cords visualized: yes Post-procedure assessment: chest rise and ETCO2 monitor Breath sounds: equal Cuff  inflated: yes ETT to lip: 23 cm Tube secured with: ETT holder Chest x-ray interpreted by me. Chest x-ray findings: endotracheal tube in appropriate  position Patient tolerance: Patient tolerated the procedure well with no immediate complications   (including critical care time) Labs Review Labs Reviewed  I-STAT CHEM 8, ED - Abnormal; Notable for the following:    Hemoglobin 17.3 (*)    All other components within normal limits  CULTURE, BLOOD (ROUTINE X 2)  CULTURE, BLOOD (ROUTINE X 2)  CBC WITH DIFFERENTIAL/PLATELET  COMPREHENSIVE METABOLIC PANEL  ETHANOL  URINE RAPID DRUG SCREEN, HOSP PERFORMED  ACETAMINOPHEN LEVEL  SALICYLATE LEVEL  URINALYSIS, ROUTINE W REFLEX MICROSCOPIC (NOT AT [facility])  PROTIME-INR  CK  BLOOD GAS, ARTERIAL  CBG MONITORING, ED  I-STAT CG4 LACTIC ACID, ED    Imaging Review No results found. I have personally reviewed and evaluated these images and lab results as part of my medical decision-making.   EKG Interpretation None      MDM   Final diagnoses:  None   patient with altered mental status, posturing, left-sided gaze incontinent of urine. History of alcoholism. Possible seizure?  Consider ETOH withdrawal, drug abuse.  ICH.  Patient given IV Ativan. CBG is normal. Patient remains obtunded and was not protecting airway.  Code stroke activated. Last seen normal around 4:30 PM. Concern for intracerebral hemorrhage.  Intubated as above and started on propofol. Tachycardic and hypertensive. Afebrile. Additional ativan given for twitching of L side. CT head without hemorrhage. Family states patient actually last seen normal yesterday. Code stroke cancelled.  Labs unremarkable.  slight lactic acidosis.  Concern for possible status epilepticus. IV keppra loading dose. Continue propofol. Drug screen negative for cocaine.  Beta blocker can now be safely given for tachycardia and hypertension..   Admission to [facility] ICU d/w Dr Arsenio Loader.  Patient would benefit from in house neurology. Alcohol withdrawal considered as well.  No fever or leukocytosis, no evidence of infection.  Stable on ventilator at  time of transfer.    CRITICAL CARE Performed by: Glynn Octave Total critical care time: Critical care time was exclusive of separately billable procedures and treating other patients. Critical care was necessary to treat or prevent imminent or life-threatening deterioration. Critical care was time spent personally by me on the following activities: development of treatment plan with patient and/or surrogate as well as nursing, discussions with consultants, evaluation of patient's response to treatment, examination of patient, obtaining history from patient or surrogate, ordering and performing treatments and interventions, ordering and review of laboratory studies, ordering and review of radiographic studies, pulse oximetry and re-evaluation of patient's condition.   Glynn Octave, MD 06/05/15 [phone]

## 2015-06-04 NOTE — ED Notes (Signed)
MD at bedside. Patient with seizure like activity. Eyes deviated left. Poor airway maintenance due to condition. When more lucid, patient would follow with eyes left intermittently, but kept eyes deviated left.

## 2015-06-04 NOTE — ED Notes (Signed)
Pt incontinent of urine, left sided gaze, r facial droop, posturing left arm.  CBG 100 per ems.

## 2015-06-04 NOTE — ED Notes (Signed)
Full bed bath completed due to urine incontinence.

## 2015-06-05 ENCOUNTER — Inpatient Hospital Stay (HOSPITAL_COMMUNITY): Payer: Medicare Other

## 2015-06-05 ENCOUNTER — Encounter (HOSPITAL_COMMUNITY): Payer: Medicaid Other

## 2015-06-05 DIAGNOSIS — F10939 Alcohol use, unspecified with withdrawal, unspecified: Secondary | ICD-10-CM | POA: Diagnosis present

## 2015-06-05 DIAGNOSIS — I6330 Cerebral infarction due to thrombosis of unspecified cerebral artery: Secondary | ICD-10-CM | POA: Diagnosis present

## 2015-06-05 DIAGNOSIS — R4020 Unspecified coma: Secondary | ICD-10-CM | POA: Diagnosis present

## 2015-06-05 DIAGNOSIS — R402431 Glasgow coma scale score 3-8, in the field [EMT or ambulance]: Secondary | ICD-10-CM

## 2015-06-05 DIAGNOSIS — F1410 Cocaine abuse, uncomplicated: Secondary | ICD-10-CM

## 2015-06-05 DIAGNOSIS — I6789 Other cerebrovascular disease: Secondary | ICD-10-CM

## 2015-06-05 DIAGNOSIS — F10239 Alcohol dependence with withdrawal, unspecified: Principal | ICD-10-CM

## 2015-06-05 LAB — BLOOD GAS, ARTERIAL
Acid-base deficit: 0.8 mmol/L (ref 0.0–2.0)
Bicarbonate: 22.2 mEq/L (ref 20.0–24.0)
Drawn by: 362771
FIO2: 0.4
LHR: 14 {breaths}/min
MECHVT: 500 mL
O2 SAT: 99.2 %
PATIENT TEMPERATURE: 98.6
PEEP/CPAP: 5 cmH2O
TCO2: 23.1 mmol/L (ref 0–100)
pCO2 arterial: 29.6 mmHg — ABNORMAL LOW (ref 35.0–45.0)
pH, Arterial: 7.489 — ABNORMAL HIGH (ref 7.350–7.450)
pO2, Arterial: 193 mmHg — ABNORMAL HIGH (ref 80.0–100.0)

## 2015-06-05 LAB — BASIC METABOLIC PANEL
Anion gap: 13 (ref 5–15)
BUN: 7 mg/dL (ref 6–20)
CALCIUM: 9.3 mg/dL (ref 8.9–10.3)
CO2: 22 mmol/L (ref 22–32)
CREATININE: 0.97 mg/dL (ref 0.61–1.24)
Chloride: 106 mmol/L (ref 101–111)
GFR calc Af Amer: >60 mL/min (ref >60)
GFR calc non Af Amer: >60 mL/min (ref >60)
GLUCOSE: 122 mg/dL — AB (ref 65–99)
Potassium: 3.9 mmol/L (ref 3.5–5.1)
Sodium: 141 mmol/L (ref 135–145)

## 2015-06-05 LAB — LIPID PANEL
CHOL/HDL RATIO: 3.2 ratio
CHOLESTEROL: 114 mg/dL (ref 0–200)
HDL: 36 mg/dL — ABNORMAL LOW (ref >40)
LDL CALC: 63 mg/dL (ref 0–99)
Triglycerides: 73 mg/dL (ref <150)
VLDL: 15 mg/dL (ref 0–40)

## 2015-06-05 LAB — GLUCOSE, CAPILLARY
GLUCOSE-CAPILLARY: 118 mg/dL — AB (ref 65–99)
Glucose-Capillary: 114 mg/dL — ABNORMAL HIGH (ref 65–99)

## 2015-06-05 LAB — MAGNESIUM: MAGNESIUM: 1.6 mg/dL — AB (ref 1.7–2.4)

## 2015-06-05 LAB — CBC
HEMATOCRIT: 39.3 % (ref 39.0–52.0)
Hemoglobin: 13.7 g/dL (ref 13.0–17.0)
MCH: 31.1 pg (ref 26.0–34.0)
MCHC: 34.9 g/dL (ref 30.0–36.0)
MCV: 89.1 fL (ref 78.0–100.0)
Platelets: 158 10*3/uL (ref 150–400)
RBC: 4.41 MIL/uL (ref 4.22–5.81)
RDW: 13 % (ref 11.5–15.5)
WBC: 7.5 10*3/uL (ref 4.0–10.5)

## 2015-06-05 LAB — URINALYSIS, ROUTINE W REFLEX MICROSCOPIC
Bilirubin Urine: NEGATIVE
Glucose, UA: NEGATIVE mg/dL
KETONES UR: NEGATIVE mg/dL
LEUKOCYTES UA: NEGATIVE
NITRITE: NEGATIVE
PH: 5 (ref 5.0–8.0)
PROTEIN: NEGATIVE mg/dL
Specific Gravity, Urine: 1.014 (ref 1.005–1.030)

## 2015-06-05 LAB — URINE MICROSCOPIC-ADD ON

## 2015-06-05 LAB — PROCALCITONIN: Procalcitonin: 0.21 ng/mL

## 2015-06-05 LAB — PHOSPHORUS: Phosphorus: 3 mg/dL (ref 2.5–4.6)

## 2015-06-05 MED ORDER — ANTISEPTIC ORAL RINSE SOLUTION (CORINZ)
7.0000 mL | OROMUCOSAL | Status: DC
  Administered 2015-06-05 – 2015-06-23 (×184): 7 mL via OROMUCOSAL

## 2015-06-05 MED ORDER — MAGNESIUM SULFATE 2 GM/50ML IV SOLN
2.0000 g | Freq: Once | INTRAVENOUS | Status: AC
  Administered 2015-06-05: 2 g via INTRAVENOUS
  Filled 2015-06-05: qty 50

## 2015-06-05 MED ORDER — STROKE: EARLY STAGES OF RECOVERY BOOK
Freq: Once | Status: AC
  Administered 2015-06-05: 05:00:00
  Filled 2015-06-05: qty 1

## 2015-06-05 MED ORDER — ASPIRIN 300 MG RE SUPP
300.0000 mg | Freq: Every day | RECTAL | Status: DC
Start: 2015-06-05 — End: 2015-06-07
  Administered 2015-06-05 – 2015-06-06 (×2): 300 mg via RECTAL
  Filled 2015-06-05 (×3): qty 1

## 2015-06-05 MED ORDER — ACETAMINOPHEN 325 MG PO TABS
650.0000 mg | ORAL_TABLET | Freq: Four times a day (QID) | ORAL | Status: DC | PRN
  Administered 2015-06-05 – 2015-06-06 (×5): 650 mg via ORAL
  Filled 2015-06-05 (×5): qty 2

## 2015-06-05 MED ORDER — CHLORHEXIDINE GLUCONATE 0.12% ORAL RINSE (MEDLINE KIT)
15.0000 mL | Freq: Two times a day (BID) | OROMUCOSAL | Status: DC
  Administered 2015-06-05 – 2015-06-21 (×33): 15 mL via OROMUCOSAL

## 2015-06-05 MED ORDER — SODIUM CHLORIDE 0.9 % IV SOLN
1000.0000 mg | Freq: Two times a day (BID) | INTRAVENOUS | Status: DC
  Administered 2015-06-05 – 2015-06-07 (×5): 1000 mg via INTRAVENOUS
  Filled 2015-06-05 (×7): qty 10

## 2015-06-05 MED ORDER — VITAL HIGH PROTEIN PO LIQD: 1000.0000 mL | ORAL | Status: DC

## 2015-06-05 MED ORDER — INFLUENZA VAC SPLIT QUAD 0.5 ML IM SUSY
0.5000 mL | PREFILLED_SYRINGE | INTRAMUSCULAR | Status: AC
  Administered 2015-06-06: 0.5 mL via INTRAMUSCULAR
  Filled 2015-06-05: qty 0.5

## 2015-06-05 MED ORDER — IOHEXOL 350 MG/ML SOLN
50.0000 mL | Freq: Once | INTRAVENOUS | Status: AC | PRN
  Administered 2015-06-05: 50 mL via INTRAVENOUS

## 2015-06-05 MED ORDER — HYDRALAZINE HCL 20 MG/ML IJ SOLN
5.0000 mg | INTRAMUSCULAR | Status: DC | PRN
  Administered 2015-06-05 – 2015-06-10 (×3): 5 mg via INTRAVENOUS
  Filled 2015-06-05 (×3): qty 1

## 2015-06-05 MED ORDER — PNEUMOCOCCAL VAC POLYVALENT 25 MCG/0.5ML IJ INJ
0.5000 mL | INJECTION | INTRAMUSCULAR | Status: AC
  Administered 2015-06-06: 0.5 mL via INTRAMUSCULAR
  Filled 2015-06-05: qty 0.5

## 2015-06-05 MED ORDER — ADULT MULTIVITAMIN LIQUID CH
5.0000 mL | Freq: Every day | ORAL | Status: DC
  Administered 2015-06-05: 5 mL via ORAL
  Filled 2015-06-05 (×2): qty 5

## 2015-06-05 MED ORDER — VITAL AF 1.2 CAL PO LIQD
1000.0000 mL | ORAL | Status: DC
  Administered 2015-06-05 – 2015-06-11 (×6): 1000 mL

## 2015-06-05 NOTE — Progress Notes (Signed)
CCM notified of pts temp of 101.3 temp. Orders received for blood cultures, U/A and sputum culture and PRN tylenol. Will continue to monitor

## 2015-06-05 NOTE — Procedures (Signed)
    Current facility-administered medications:  .  0.9 %  sodium chloride infusion, 250 mL, Intravenous, PRN, Rahul P Desai, PA-C, Last Rate: 20 mL/hr at 06/05/15 1900, 250 mL at 06/05/15 1900 .  acetaminophen (TYLENOL) tablet 650 mg, 650 mg, Oral, Q6H PRN, [address] MD, 650 mg at 06/05/15 1958 .  antiseptic oral rinse solution (CORINZ), 7 mL, Mouth Rinse, 10 times per day, Chilton Greathouse, MD, 7 mL at 06/05/15 1841 .  aspirin suppository 300 mg, 300 mg, Rectal, Daily, Pearlie Oyster, MD, 300 mg at 06/05/15 0456 .  chlorhexidine gluconate (PERIDEX) 0.12 % solution 15 mL, 15 mL, Mouth Rinse, BID, Praveen Mannam, MD, 15 mL at 06/05/15 1958 .  feeding supplement (VITAL AF 1.2 CAL) liquid 1,000 mL, 1,000 mL, Per Tube, Continuous, Alyson Reedy, MD, Last Rate: 30 mL/hr at 06/05/15 2000, 1,000 mL at 06/05/15 2000 .  folic acid injection 1 mg, 1 mg, Intravenous, Daily, Rahul P Desai, PA-C, 1 mg at 06/05/15 0940 .  heparin injection 5,000 Units, 5,000 Units, Subcutaneous, 3 times per day, Rahul P Desai, PA-C, 5,000 Units at 06/05/15 1405 .  hydrALAZINE (APRESOLINE) injection 5 mg, 5 mg, Intravenous, Q4H PRN, Praveen Mannam, MD, 5 mg at 06/05/15 1959 .  [START ON 06/06/2015] Influenza vac split quadrivalent PF (FLUARIX) injection 0.5 mL, 0.5 mL, Intramuscular, Tomorrow-1000, Praveen Mannam, MD .  levETIRAcetam (KEPPRA) 1,000 mg in sodium chloride 0.9 % 100 mL IVPB, 1,000 mg, Intravenous, Q12H, Pearlie Oyster, MD, 1,000 mg at 06/05/15 1958 .  midazolam (VERSED) injection 2 mg, 2 mg, Intravenous, Q15 min PRN, Rahul P Desai, PA-C .  midazolam (VERSED) injection 2 mg, 2 mg, Intravenous, Q2H PRN, Rahul P Desai, PA-C .  multivitamin liquid 5 mL, 5 mL, Oral, Daily, Marvel Plan, MD, 5 mL at 06/05/15 2005 .  pantoprazole (PROTONIX) injection 40 mg, 40 mg, Intravenous, QHS, Rahul P Desai, PA-C, 40 mg at 06/05/15 0031 .  [START ON 06/06/2015] pneumococcal 23 valent vaccine (PNU-IMMUNE) injection 0.5 mL, 0.5 mL,  Intramuscular, Tomorrow-1000, Praveen Mannam, MD .  propofol (DIPRIVAN) 1000 MG/100ML infusion, 5-70 mcg/kg/min, Intravenous, Titrated, Glynn Octave, MD, Stopped at 06/05/15 0750 .  thiamine (B-1) injection 100 mg, 100 mg, Intravenous, Daily, Rahul P Desai, PA-C, 100 mg at 06/05/15 1610  Introduction:  This is a 19 channel routine scalp EEG performed at the bedside with bipolar and monopolar montages arranged in accordance to the international 10/20 system of electrode placement. One channel was dedicated to EKG recording.    Findings:  The background rhythm was normal 8.5-9 Hz alpha . No definite evidence of abnormal epileptiform discharges or electrographic seizures were noted during this recording.   Impression:  This is an unremarkable awake and drowsy routine inpatient EEG. Clinical correlation is recommended .

## 2015-06-05 NOTE — Progress Notes (Signed)
EEG completed; results pending.    

## 2015-06-05 NOTE — Progress Notes (Signed)
STROKE TEAM PROGRESS NOTE   HISTORY OF PRESENT ILLNESS Gregory Parker is a 60 y.o. male with unresponsiveness. History is obtained through chart review as patient is intubated and on propofol. The patient has a history of HTN, ETOH abuse, and prior CVAs. 2/27 EMS was summoned to the patient's home due to him being found unresponsive and shaking episodes by family around 5pm. He was last seen normal at 4:30pm (LKW 06/04/2015 at 1630). He has a history of ETOH abuse and last drink was today. Blood alcohol level was undetectable. Per family, he may have smoked crack cocaine in the past few days but his UTOX screen was negative for any drugs of abuse.  In the [facility] emergency department, staff noted urinary incontinence, leftward gaze deviation, right sided facial droop, and posturing on the left side. He then proceeded to have seizure like activity with limited airway protection, and thus was intubated. He had a CT of the head which showed, no acute abnormality, but chronic bilateral infarcts. He was loaded with 1Gram IV Keppra and started on propofol for sedation/seizure management. He was transferred to [facility] for neuro ICU admission and neurology evaluation.   Patient has subsequently developed a fever and continues to posture.   Patient was not administered IV tPA.    SUBJECTIVE (INTERVAL HISTORY) His RN is at the bedside.  He lives with his niece who plans to come to the hospital after work today. Patient remains intubated, though he is weaning. No active seizure. Off sedation. Will do EEG to evaluate seizure.    OBJECTIVE Temp:  [97.2 F (36.2 C)-101.3 F (38.5 C)] 98.4 F (36.9 C) (02/28 0800) Pulse Rate:  [84-135] 84 (02/28 0800) Cardiac Rhythm:  [-] Normal sinus rhythm (02/28 0800) Resp:  [13-29] 20 (02/28 0800) BP: (115-205)/(65-151) 115/80 mmHg (02/28 0800) SpO2:  [89 %-100 %] 100 % (02/28 0800) FiO2 (%):  [40 %-50 %] 40 % (02/28 0800) Weight:  [52.6 kg (115 lb 15.4  oz)-63.504 kg (140 lb)] 52.6 kg (115 lb 15.4 oz) (02/28 0600)  CBC:  Recent Labs Lab 06/04/15 1753 06/04/15 1758 06/05/15 0055  WBC 6.2  --  7.5  NEUTROABS 4.2  --   --   HGB 15.7 17.3* 13.7  HCT 44.4 51.0 39.3  MCV 90.1  --  89.1  PLT 182  --  158    Basic Metabolic Panel:  Recent Labs Lab 06/04/15 1753 06/04/15 1758 06/05/15 0055  NA 139 142 141  K 3.8 3.8 3.9  CL 103 102 106  CO2 26  --  22  GLUCOSE 102* 97 122*  BUN CREATININE 0.93 0.90 0.97  CALCIUM 10.1  --  9.3  MG  --   --  1.6*  PHOS  --   --  3.0    Lipid Panel:    Component Value Date/Time   CHOL 114 06/05/2015 0538   TRIG 73 06/05/2015 0538   HDL 36* 06/05/2015 0538   CHOLHDL 3.2 06/05/2015 0538   VLDL 15 06/05/2015 0538   LDLCALC 63 06/05/2015 0538   HgbA1c: No results found for: HGBA1C Urine Drug Screen:    Component Value Date/Time   LABOPIA NONE DETECTED 06/04/2015 1840   COCAINSCRNUR NONE DETECTED 06/04/2015 1840   LABBENZ NONE DETECTED 06/04/2015 1840   AMPHETMU NONE DETECTED 06/04/2015 1840   THCU NONE DETECTED 06/04/2015 1840   LABBARB NONE DETECTED 06/04/2015 1840      IMAGING I have personally reviewed the radiological  images below and agree with the radiology interpretations.  Ct Head Wo Contrast 06/04/2015  1.  No acute intracranial abnormality. 2. Remote bilateral basal ganglia lacunar infarcts. 3. Apparent soft tissue fullness in the nasopharynx could relate to retained secretions, especially given absence of mastoid effusions. Consider physical exam correlation to exclude unlikely soft tissue mass in this area.   Mr Brain Wo Contrast 06/05/2015   Acute LEFT basal ganglia lacunar infarct. Old bilateral basal ganglia and thalamus lacunar infarcts. Old small LEFT cerebellar infarct. Moderate to severe chronic small vessel ischemic disease.   Dg Chest Port 1 View 06/05/2015  1. Lines and tubes stable position. 2. Low lung volumes with mild bibasilar atelectasis  06/04/2015    1. Endotracheal tube tip well-positioned 4 cm above the carina. 2. Clear lungs.  06/04/2015   Endotracheal tube in the right mainstem bronchus. Recommend retraction and repositioning by approximately 5-6 cm. Mild left lung base interstitial markings.  06/04/2015  1. Endotracheal tube tip now projects 5.5 cm above the carina. No other change from the prior study.   CTA head and neck No extracranial or intracranial stenosis of significance to explain the patient's acute LEFT basal ganglia infarct. Specifically no LEFT MCA dissection or thrombus. Chronic changes as described. Developing cytotoxic edema LEFT basal ganglia and white matter corresponding to restricted diffusion on MR. No hemorrhagic transformation.  2D echo - Normal LV size and systolic function, EF 60-65%. Normal RV size and systolic function. No significant valvular abnormalities. Small circumferential pericardial effusion.  EEG - pending   PHYSICAL EXAM  Temp:  [97.2 F (36.2 C)-101.3 F (38.5 C)] 100.2 F (37.9 C) (02/28 1400) Pulse Rate:  [84-117] 110 (02/28 1530) Resp:  [13-29] 25 (02/28 1530) BP: (115-202)/(65-151) 166/98 mmHg (02/28 1530) SpO2:  [89 %-100 %] 100 % (02/28 1530) FiO2 (%):  [40 %-50 %] 40 % (02/28 1530) Weight:  [115 lb 15.4 oz (52.6 kg)-140 lb (63.504 kg)] 115 lb 15.4 oz (52.6 kg) (02/28 0600)  General - thin built, well developed, intubated on vent off sedation.  Ophthalmologic - Fundi not visualized due to ET tube and positioning.  Cardiovascular - Regular rhythm, but tachycardia.  Neuro - intubated, off sedation, not open eyes on voice or pain, not following commands. PERRL, doll's eye positive, eyes in middle position, positive corneal and gag. On pain stimulation, moving all extremities but right side UE and LE moving less then left UE and LE. LUE and LLE able to localize pain and against gravity, RUE and RLE withdraw to pain but barely against gravity. Bilaterally babinski positive, DTR  1+. Sensation, coordination and gait not tested.   ASSESSMENT/PLAN [name] is a 60 y.o. male with history of HTN, ETOH abuse, and prior CVAs found unresponsive and shaking at home. Found to have urinary incontinence, leftward gaze deviation, right sided facial droop, and posturing on the left side. At [facility], loaded with Keppra and intubated. Transferred to [facility] for treatment.  He did not receive IV t-PA.   Seziure - ? Alcohol withdraw seizure  Hx seizures, status epilepticus  Recent heavy ETOH and cocaine use per family, though UDS & ETOH levels negative  Question for ETOH withdrawal seizure  Loaded with Keppra. On 1000 bid.  EEG pending   Seizure precautions.  Acute Respiratory Failure  Secondary to seizures  Intubated  Sedated initially, off propofol now  Stroke:  left basal ganglia infarct, but not able to explain presenting symptoms, likely secondary to small vessel disease source  MRI  L BG small infarct. Old bilateral basal ganglia and thalamic infarcts. Old L cerebellar infarct.  CTA head and neck unremarkable   2D Echo  EF 60-65%   LDL 63  HgbA1c pending  Heparin 5000 units sq tid for VTE prophylaxis  Diet NPO time specified  No antithrombotic prior to admission, now on aspirin 300 mg suppository daily.  Ongoing aggressive stroke risk factor management  Therapy recommendations:  pending   Disposition:  pending  (lives with niece)  Hypertension  Elevated diastolic 110s  From stroke standpoint, Stable  Permissive hypertension (OK if < 220/120) but gradually normalize in 5-7 days  Fever   febrile with Tmax 101.5  WBC 7.5  UA negative  Blood Cx and sputum Cx sent  Tylenol PRN  CCM on board  Alcohol abuse  Alcohol not detectable  On FA, MVit, and B1  CIWA protocol  tachycardia  Other Stroke Risk Factors  Crack Cocaine use - UDS negative this admission  Other Active Problems  tachycardia  Hospital day # 1  This  patient is critically ill due to respiratory failure, acute stroke, seizure and at significant risk of neurological worsening, death form status epilepticus, heart failure, recurrent stroke, and DT. This patient's care requires constant monitoring of vital signs, hemodynamics, respiratory and cardiac monitoring, review of multiple databases, neurological assessment, discussion with family, other specialists and medical decision making of high complexity. I spent 40 minutes of neurocritical care time in the care of this patient.   Marvel Plan, MD PhD Stroke Neurology 06/05/2015 6:51 PM    To contact Stroke Continuity provider, please refer to [email or web address]. After hours, contact General Neurology

## 2015-06-05 NOTE — Progress Notes (Signed)
PULMONARY / CRITICAL CARE MEDICINE   Name: Gregory Parker MRN: [id number] DOB: 22-Aug-1955    ADMISSION DATE:  06/04/2015 CONSULTATION DATE:  06/04/2015  REFERRING MD:  EDP at [facility]  CHIEF COMPLAINT:  AMS  HISTORY OF PRESENT ILLNESS:  Patient is encephalopathic, therefore; HPI obtained from chart review. 60 year old male with PMH as below, which is significant for HTN, ETOH, and CVA. 2/27 EMS was dispatched to the patient's home due to reports of shaking episodes with decreased responsiveness. He has a history of ETOH abuse and family reports that last drink was today, and he has only had one drink today. Family also reports that he may have smoked crack over the weekend. He was last seen normal 430 PM. In the emergency department, staff noted urinary incontinence, leftward gaze deviation, R facial droop, and posturing on the L side. He then proceeded to have seizure like activity with limited airway protection, and thus was intubated by EDP. He had a CT of the head which showed, no acute abnormality, but remote basal ganglia infarcts. He was loaded with 1G IV Keppra and started on propofol for sedation/seizure management. He was transferred to [facility] for ICU admission and neurology evaluation.    SUBJECTIVE:  Sedated/intubated on vent support. Ocassional twitching of lower extremities and left hand noted.  VITAL SIGNS: BP 115/80 mmHg  Pulse 84  Temp(Src) 98.4 F (36.9 C) (Axillary)  Resp 20  Ht  (1.626 m)  Wt 115 lb 15.4 oz (52.6 kg)  BMI 19.90 kg/m2  SpO2 100%  HEMODYNAMICS:    VENTILATOR SETTINGS: Vent Mode:  [-] PRVC FiO2 (%):  [40 %-50 %] 40 % Set Rate:  [14 bmp] 14 bmp Vt Set:  [500 mL] 500 mL PEEP:  [5 cmH20] 5 cmH20 Plateau Pressure:  [13 cmH20-26 cmH20] 17 cmH20  INTAKE / OUTPUT: I/O last 3 completed shifts: In: 810.2 [I.V.:810.2] Out: 940 [Urine:940]  PHYSICAL EXAMINATION: General: AA male, in NAD. Neuro: Sedated, grimaces with pain   HEENT: [place]/AT.  PERRL, sclerae anicteric. Cardiovascular: Tachy, regular, no M/R/G.  Lungs: Respirations even and unlabored.  Coarse bilaterally. Abdomen: BS x 4, soft, NT/ND.  Musculoskeletal: No gross deformities, no edema.  Skin: Intact, warm, no rashes.   BMET  Recent Labs Lab 06/04/15 1753 06/04/15 1758 06/05/15 0055  NA 139 142 141  K 3.8 3.8 3.9  CL 103 102 106  CO2 26  --  22  BUN CREATININE 0.93 0.90 0.97  GLUCOSE 102* 97 122*    Electrolytes  Recent Labs Lab 06/04/15 1753 06/05/15 0055  CALCIUM 10.1 9.3  MG  --  1.6*  PHOS  --  3.0    CBC  Recent Labs Lab 06/04/15 1753 06/04/15 1758 06/05/15 0055  WBC 6.2  --  7.5  HGB 15.7 17.3* 13.7  HCT 44.4 51.0 39.3  PLT 182  --  158    Coag's  Recent Labs Lab 06/04/15 1753  INR 0.97    Sepsis Markers  Recent Labs Lab 06/04/15 1800 06/04/15 2308 06/05/15 0414  LATICACIDVEN 1.24 1.3  --   PROCALCITON  --   --  0.21    ABG  Recent Labs Lab 06/04/15 1855 06/05/15 0545  PHART 7.471* 7.489*  PCO2ART 32.0* 29.6*  PO2ART 72.8* 193*    Liver Enzymes  Recent Labs Lab 06/04/15 1753  AST 27  ALT 22  ALKPHOS 75  BILITOT 0.7  ALBUMIN 4.6    Cardiac Enzymes  Recent Labs Lab 06/04/15 2308  TROPONINI 0.04*    Glucose  Recent Labs Lab 06/04/15 1749 06/04/15 2138  GLUCAP 90 128*    Imaging Ct Head Wo Contrast  06/04/2015  CLINICAL DATA:  Altered mental status. Shaking and decreased responsiveness. History of alcohol abuse and stroke. EXAM: CT HEAD WITHOUT CONTRAST TECHNIQUE: Contiguous axial images were obtained from the base of the skull through the vertex without intravenous contrast. COMPARISON:  None. FINDINGS: Sinuses/Soft tissues: Surgical changes about the left maxillary sinus and zygoma, incompletely imaged. Apparent soft tissue fullness within the nasopharynx, including image 1/series 3. Hypoplastic frontal sinuses. Other paranasal sinuses and mastoid air cells are  unremarkable. Intracranial: Age advanced cerebellar atrophy. Remote lacunar infarcts involving the left lateral basal ganglia, right thalamus and caudate. No mass lesion, hemorrhage, hydrocephalus, acute infarct, intra-axial, or extra-axial fluid collection. IMPRESSION: 1.  No acute intracranial abnormality. 2. Remote bilateral basal ganglia lacunar infarcts. 3. Apparent soft tissue fullness in the nasopharynx could relate to retained secretions, especially given absence of mastoid effusions. Consider physical exam correlation to exclude unlikely soft tissue mass in this area. Electronically Signed   By: Jeronimo Greaves M.D.   On: 06/04/2015 18:19   Mr Brain Wo Contrast  06/05/2015  CLINICAL DATA:  Unresponsive. History of hypertension, stroke, alcohol abuse. EXAM: MRI HEAD WITHOUT CONTRAST TECHNIQUE: Multiplanar, multiecho pulse sequences of the brain and surrounding structures were obtained without intravenous contrast. COMPARISON:  CT head June 04, 2015 FINDINGS: 15 x 24 mm area of reduced diffusion LEFT posterior basal ganglia with corresponding low ADC values, mild FLAIR T2 hyperintense signal. No susceptibility artifact to suggest acute blood products. Old bilateral basal ganglia and thalamus lacunar infarcts, superimposed minimal susceptibility artifact associated with the RIGHT basal ganglia infarcts. Old small LEFT cerebellar infarct. Patchy to confluent supratentorial white matter FLAIR T2 hyperintensities without midline shift, mass effect or masses. Asymmetrically smaller RIGHT cerebral peduncle compatible with wallerian degeneration. No abnormal extra-axial fluid collections. No extra-axial masses though, contrast enhanced sequences would be more sensitive. Normal major intracranial vascular flow voids seen at the skull base. Old LEFT medial orbital blowout fracture. Old LEFT facial fractures. Small suspected LEFT frontal scalp hematoma. No abnormal sellar expansion. No suspicious calvarial bone  marrow signal. Craniocervical junction maintained. Visualized paranasal sinuses and mastoid air cells are well-aerated. IMPRESSION: Acute LEFT basal ganglia lacunar infarct. Old bilateral basal ganglia and thalamus lacunar infarcts. Old small LEFT cerebellar infarct. Moderate to severe chronic small vessel ischemic disease. Electronically Signed   By: Awilda Metro M.D.   On: 06/05/2015 02:42   Dg Chest Port 1 View  06/05/2015  CLINICAL DATA:  Intubation. EXAM: PORTABLE CHEST 1 VIEW COMPARISON:  06/04/2015. FINDINGS: Lines and tubes in stable position. Heart size stable. Low lung volumes with mild bibasilar atelectasis. No pleural effusion or pneumothorax. IMPRESSION: 1. Lines and tubes stable position. 2. Low lung volumes with mild bibasilar atelectasis Electronically Signed   By: Maisie Fus  Register   On: 06/05/2015 07:47   Dg Chest Port 1 View  06/04/2015  CLINICAL DATA:  Encounter for intubation. EXAM: PORTABLE CHEST 1 VIEW COMPARISON:  06/04/2015 at 1844 hours FINDINGS: Endotracheal tube tip now projects 4 cm above the carina. Nasal/ orogastric tube is well positioned passing below the diaphragm well into the stomach, unchanged. Interstitial prominence noted at the left lung base on the prior exams is not evident currently. Lungs are now clear. No pleural effusion or pneumothorax. IMPRESSION: 1. Endotracheal tube tip well-positioned 4 cm above the  carina. 2. Clear lungs. Electronically Signed   By: Amie Portland M.D.   On: 06/04/2015 22:10   Dg Chest Portable 1 View  06/04/2015  CLINICAL DATA:  60 year old male with altered mental status. ET tube placement. EXAM: PORTABLE CHEST 1 VIEW COMPARISON:  None. FINDINGS: An endotracheal tube is noted with tip at the origin of the right mainstem bronchus. Recommend retraction and repositioning of tube by approximately 5-6 cm. An enteric tube is partially visualized coursing to the left upper abdomen. Tip of the enteric tube is beyond the image margin.  Single-view of the chest demonstrate mild increased interstitial densities in the left lower lung field. No focal consolidation, pleural effusion, or pneumothorax. The cardiac silhouette is within normal limits. No acute osseous pathology. The degenerative changes of the left shoulder. IMPRESSION: Endotracheal tube in the right mainstem bronchus. Recommend retraction and repositioning by approximately 5-6 cm. Mild left lung base interstitial markings. These results were called by telephone at the time of interpretation on 06/04/2015 at 6:33 pm to Dr. Clayborne Dana, who verbally acknowledged these results. Electronically Signed   By: Elgie Collard M.D.   On: 06/04/2015 18:34   Dg Chest Port 1v Same Day  06/04/2015  CLINICAL DATA:  Reposition of endotracheal tube.  Follow-up study. EXAM: PORTABLE CHEST 1 VIEW COMPARISON:  06/04/2015 at 1804 hours FINDINGS: Endotracheal tube tip has been retracted. Tip now projects 5.5 cm above the carina. Orogastric tube is stable passing below the diaphragm well into the stomach. Prominent interstitial densities the left lung base similar to the prior exam. No new lung abnormalities. No pleural effusion or pneumothorax. IMPRESSION: 1. Endotracheal tube tip now projects 5.5 cm above the carina. No other change from the prior study. Electronically Signed   By: Amie Portland M.D.   On: 06/04/2015 18:58     STUDIES:  CT head 2/27 >  No acute intracranial abnormality. Remote bilateral basal ganglia lacunar infarcts. Apparent soft tissue fullness in the nasopharynx could relate to retained secretions, especially given absence of mastoid effusions.   CULTURES: BCx2 2/27 >  ANTIBIOTICS: None.  SIGNIFICANT EVENTS: 2/27 AMS, ? Seizure, intubated in [facility] ED, tx to [facility] ICU  LINES/TUBES: ETT 2/27>>>  ASSESSMENT / PLAN:  NEUROLOGIC A:   Seizures with concern for status epilepticus. Hx ETOH and polysubstance abuse (crack cocaine). P:   Continue Sedation: Propofol gtt /  Midazolam PRN. RASS goal: 0 to -1. Keppra incresed by Neuro ContinueThiamine / Folate. MRI per neurology. Further recommendations per neurology.  PULMONARY A: Acute hypoxemic respiratory failure secondary to seizures. P:   Continue full vent support  Trend ABG. VAP prevention measures. CXR in AM. Begin PS trials.  CARDIOVASCULAR A:  H/o HTN. P:  Monitor hemodynamics Trend Troponin PRN Hydralazine for SBP>160.  RENAL A:   No acute issues. P:   KVO IVF. Trend BMP Replace electrolytes as indicated.  GASTROINTESTINAL A:   ETOH abuse. GI prophylaxis. Nutrition. P:   Continue protonix. Consult nutrition for TF as per nutrition.  HEMATOLOGIC A:   VTE prophylaxis. P:  Continue SCD's / heparin. Trend CBC  INFECTIOUS A:   No acute indication of infection. P:   Monitor clinically. Follow cultures   ENDOCRINE A:   No acute issues.   P:   No interventions required.  Bincy Varughese,AG-ACNP Pulmonary & Critical Care  Attending Note:  Above note edited in full and orders entered.  60 year old male with etoh and drug abuse history presenting with seizures and acute CVA.  Currently unresponsive on my exam.  .  Will begin PS trials, no extubation given neuro status, minimize sedation as able, replace electrolytes as indicated, KVO IVF, begin TF and will await further recommendations per neuro service.  The patient is critically ill with multiple organ systems failure and requires high complexity decision making for assessment and support, frequent evaluation and titration of therapies, application of advanced monitoring technologies and extensive interpretation of multiple databases.   Critical Care Time devoted to patient care services described in this note is  35  Minutes. This time reflects time of care of this signee Dr Koren Bound. This critical care time does not reflect procedure time, or teaching time or supervisory time of PA/NP/Med student/Med Resident  etc but could involve care discussion time.  Alyson Reedy, M.D. [facility] Pulmonary/Critical Care Medicine. Pager: [phone]. After hours pager: [phone].

## 2015-06-05 NOTE — Progress Notes (Signed)
  Echocardiogram 2D Echocardiogram has been performed.  Leta Jungling M 06/05/2015, 3:09 PM

## 2015-06-05 NOTE — Progress Notes (Signed)
eLink Physician-Brief Progress Note Patient Name: Gregory Parker DOB: [birth year] MRN: [id number]   Date of Service  06/05/2015  HPI/Events of Note  RN called re: BP 170/110. Neuro wanted MRI brain to R/O CVA  eICU Interventions  keepSBP < 185, DBP < 110 pending MRI. Pt is sensitive to propofol      Intervention Category Intermediate Interventions: Hypertension - evaluation and management  [name] 06/05/2015, 1:31 AM

## 2015-06-05 NOTE — Progress Notes (Signed)
Initial Nutrition Assessment  DOCUMENTATION CODES:   Severe malnutrition in context of chronic illness  INTERVENTION:   Initiate Vital AF 1.2 @ 15 ml/hr via OG tube and increase by 10 ml every 12 hours to goal rate of 55 ml/hr.   Tube feeding regimen provides 1584 kcal (97% of needs), 99 grams of protein, and 1070 ml of H2O.   Monitor magnesium, potassium, and phosphorus daily for at least 3 days, MD to replete as needed, as pt is at risk for refeeding syndrome given severe malnutrition.  NUTRITION DIAGNOSIS:   Malnutrition related to chronic illness as evidenced by severe depletion of body fat, severe depletion of muscle mass.  GOAL:   Patient will meet greater than or equal to 90% of their needs  MONITOR:   TF tolerance, I & O's, Vent status, Labs  REASON FOR ASSESSMENT:   Consult Enteral/tube feeding initiation and management  ASSESSMENT:   Pt with etoh and drug abuse history presenting with seizures and acute CVA.  Patient is currently intubated on ventilator support MV: 6.8 L/min Temp (24hrs), Avg:99.5 F (37.5 C), Min:97.2 F (36.2 C), Max:101.3 F (38.5 C)  Medications reviewed and include: folic acid, magnesium sulfate, thiamine Labs reviewed: magnesium low 1.6 OG tube (per xray lines and tubes in good position) Pt discussed during ICU rounds and with RN. No family present. Nutrition hx unknown.  Nutrition-Focused physical exam completed. Findings are severe fat depletion, moderate-severe muscle depletion, and no edema.    Diet Order:  Diet NPO time specified  Skin:  Reviewed, no issues  Last BM:  unknown  Height:   Ht Readings from Last 1 Encounters:  06/04/15  (1.626 m)    Weight:   Wt Readings from Last 1 Encounters:  06/05/15 115 lb 15.4 oz (52.6 kg)    Ideal Body Weight:  59 kg  BMI:  Body mass index is 19.9 kg/(m^2).  Estimated Nutritional Needs:   Kcal:  1625  Protein:  80-100 grams  Fluid:  > 1.6 L/day  EDUCATION  NEEDS:   No education needs identified at this time  Kendell Bane RD, LDN, CNSC [phone] Pager [phone] After Hours Pager

## 2015-06-05 NOTE — Consult Note (Signed)
Subjective: He is a 60 y.o. male I am consulted to evaluate for unresponsiveness. History is obtained through chart review as patient is intubated and on propofol. The patient has a history of HTN, ETOH abuse, and prior CVAs. 2/27 EMS was summoned to the patient's home due to him being found unresponsive and shaking episodes by family around 5pm. He was last seen normal at 4:30pm.  He has a history of ETOH abuse and last drink was today. Blood alcohol level was undetectable. Per family, he may have smoked crack cocaine in the past few days but his UTOX screen was negative for any drugs of abuse.     In the [facility] emergency department, staff noted urinary incontinence, leftward gaze deviation, right sided facial droop, and posturing on the left side. He then proceeded to have seizure like activity with limited airway protection, and thus was intubated. He had a CT of the head which showed, no acute abnormality, but chronic bilateral infarcts. He was loaded with 1Gram IV Keppra and started on propofol for sedation/seizure management. He was transferred to [facility] for ICU admission and neurology evaluation.   Patient has subsequently developed a fever and continues to posture.   Patient Active Problem List   Diagnosis Date Noted  . Acute respiratory failure (HCC) 06/04/2015  . Seizures (HCC) 06/04/2015  . Encounter for intubation   . Status epilepticus (HCC)   . Alcohol abuse   . Malignant hypertension    Past Medical History  Diagnosis Date  . Alcohol abuse   . Stroke [facility])     History reviewed. No pertinent past surgical history.  No prescriptions prior to admission   No Known Allergies  Social History  Substance Use Topics  . Smoking status: Unknown If Ever Smoked  . Smokeless tobacco: Not on file  . Alcohol Use: Yes     Comment: heavily at times    No family history on file.   Review of Systems Review of systems not obtained due to patient factors.  Objective: Vital  signs in last 24 hours: Temp:  [97.2 F (36.2 C)-101.3 F (38.5 C)] 100.8 F (38.2 C) (02/28 0030) Pulse Rate:  [110-135] 114 (02/28 0030) Resp:  [14-29] 16 (02/28 0030) BP: (127-205)/(89-151) 147/124 mmHg (02/28 0026) SpO2:  [94 %-100 %] 99 % (02/28 0030) FiO2 (%):  [50 %] 50 % (02/28 0026) Weight:  [53.6 kg (118 lb 2.7 oz)-63.504 kg (140 lb)] 53.6 kg (118 lb 2.7 oz) (02/27 2144)   Glasgow Coma Score Eye opening: 1 - Does not open eyes  Verbal:  1 - Makes no noise  Motor:  3 - Flexor response (decorticate)  GCS Total: 5   BP 147/124 mmHg  Pulse 114  Temp(Src) 100.8 F (38.2 C) (Axillary)  Resp 16  Ht  (1.626 m)  Wt 53.6 kg (118 lb 2.7 oz)  BMI 20.27 kg/m2  SpO2 99%  General Appearance:    Comatose and sedated on propofol. Intubated  Head:    Normocephalic, without obvious abnormality, atraumatic  Eyes:    PERRL, conjunctiva/corneas clear, EOM's intact, fundi    benign, both eyes       Ears:    Normal TM's and external ear canals, both ears  Nose:   Nares normal, septum midline, mucosa normal, no drainage    or sinus tenderness     Neck:   Supple, symmetrical, trachea midline, no adenopathy;       thyroid:      Lungs:  Coarse breath sounds bilaterally.      Heart:    Regular rate and rhythm, S1 and S2 normal, no murmur, rub   or gallop  Abdomen:     Soft, non-tender, bowel sounds active all four quadrants,    no masses, no organomegaly        Extremities:   Extremities normal, atraumatic, no cyanosis or edema  Pulses:   2+ and symmetric all extremities  Skin:   Skin color, texture, turgor normal, no rashes or lesions     Neurologic:    Mental Status: Comatose. Non-verbal, intubated. Unable to follow commands. No eye opening. Pupils are 2mm, equal and slightly reactive. +Corneal Blink reflex bilaterally. +Oculocephalic reflex. +Weak gag reflex. Remainder of cranial nerves could not be tested due to coma. Motor: Increased tone in all four extremities. Flexion  of left arm and both legs to local noxious stimuli. Extension of right arm to noxious stimuli. Sensory:  Intact to noxious stimuli in all four extremities. DTRs- 3+ toes are upgoing bilaterally Coord and Gait- unable to test in coma    Data Review  BMP:  Lab Results  Component Value Date   GLUCOSE 97 06/04/2015   CO2 26 06/04/2015   BUN 11 06/04/2015   CREATININE 0.90 06/04/2015   CALCIUM 10.1 06/04/2015    Imaging: CT Head: obtained and reviewed  Assessment/Plan: Active Problems:   Acute respiratory failure (HCC)   Seizures (HCC)   Encounter for intubation   Status epilepticus (HCC)   Alcohol abuse   Malignant hypertension   1. Stat MRI brain without contrast to evaluate for stroke 2. Continue Keppra. I will increase dose to  bid 3. EEG 4. Seizure precautions 5. Continue to sedate with propofol 6. Management of medical issues per Critical Care

## 2015-06-05 NOTE — Progress Notes (Signed)
eLink Physician-Brief Progress Note Patient Name: Gregory Parker DOB: Nov 20, 1955 MRN: [id number]   Date of Service  06/05/2015  HPI/Events of Note  RN calls for fever. CXR clear. (-) WBC. MRSA (-).   eICU Interventions  Blood, sputum, u/a culture. Tylenol. Hold off on abx pending labs. May ne part of withdrawal.      Intervention Category Intermediate Interventions: Other:  [name] 06/05/2015, 12:17 AM

## 2015-06-06 ENCOUNTER — Inpatient Hospital Stay (HOSPITAL_COMMUNITY): Payer: Medicare Other

## 2015-06-06 DIAGNOSIS — R509 Fever, unspecified: Secondary | ICD-10-CM

## 2015-06-06 DIAGNOSIS — F10239 Alcohol dependence with withdrawal, unspecified: Secondary | ICD-10-CM | POA: Diagnosis not present

## 2015-06-06 DIAGNOSIS — R401 Stupor: Secondary | ICD-10-CM

## 2015-06-06 DIAGNOSIS — G039 Meningitis, unspecified: Secondary | ICD-10-CM

## 2015-06-06 DIAGNOSIS — F10231 Alcohol dependence with withdrawal delirium: Secondary | ICD-10-CM

## 2015-06-06 LAB — CSF CELL COUNT WITH DIFFERENTIAL
RBC COUNT CSF: 26 /mm3 — AB
RBC Count, CSF: 161 /mm3 — ABNORMAL HIGH
TUBE #: 1
Tube #: 4
WBC, CSF: 9 /mm3 — ABNORMAL HIGH (ref 0–5)
WBC, CSF: 4 /mm3 (ref 0–5)

## 2015-06-06 LAB — CBC WITH DIFFERENTIAL/PLATELET
BASOS ABS: 0 10*3/uL (ref 0.0–0.1)
Basophils Relative: 0 %
Eosinophils Absolute: 0 10*3/uL (ref 0.0–0.7)
Eosinophils Relative: 0 %
HEMATOCRIT: 40.7 % (ref 39.0–52.0)
Hemoglobin: 13.7 g/dL (ref 13.0–17.0)
LYMPHS PCT: 12 %
Lymphs Abs: 1.3 10*3/uL (ref 0.7–4.0)
MCH: 30.1 pg (ref 26.0–34.0)
MCHC: 33.7 g/dL (ref 30.0–36.0)
MCV: 89.5 fL (ref 78.0–100.0)
Monocytes Absolute: 0.8 10*3/uL (ref 0.1–1.0)
Monocytes Relative: 8 %
NEUTROS ABS: 8.4 10*3/uL — AB (ref 1.7–7.7)
NEUTROS PCT: 80 %
Platelets: 172 10*3/uL (ref 150–400)
RBC: 4.55 MIL/uL (ref 4.22–5.81)
RDW: 13.7 % (ref 11.5–15.5)
WBC: 10.5 10*3/uL (ref 4.0–10.5)

## 2015-06-06 LAB — BASIC METABOLIC PANEL
Anion gap: 11 (ref 5–15)
BUN: 10 mg/dL (ref 6–20)
CALCIUM: 9.1 mg/dL (ref 8.9–10.3)
CO2: 20 mmol/L — AB (ref 22–32)
CREATININE: 1.02 mg/dL (ref 0.61–1.24)
Chloride: 108 mmol/L (ref 101–111)
GFR calc Af Amer: >60 mL/min (ref >60)
GFR calc non Af Amer: >60 mL/min (ref >60)
Glucose, Bld: 117 mg/dL — ABNORMAL HIGH (ref 65–99)
Potassium: 3.5 mmol/L (ref 3.5–5.1)
Sodium: 139 mmol/L (ref 135–145)

## 2015-06-06 LAB — GLUCOSE, CAPILLARY
Glucose-Capillary: 122 mg/dL — ABNORMAL HIGH (ref 65–99)
Glucose-Capillary: 134 mg/dL — ABNORMAL HIGH (ref 65–99)
Glucose-Capillary: 110 mg/dL — ABNORMAL HIGH (ref 65–99)
Glucose-Capillary: 125 mg/dL — ABNORMAL HIGH (ref 65–99)
Glucose-Capillary: 82 mg/dL (ref 65–99)

## 2015-06-06 LAB — CRYPTOCOCCAL ANTIGEN, CSF: CRYPTO AG: NEGATIVE

## 2015-06-06 LAB — PHOSPHORUS: PHOSPHORUS: 2.8 mg/dL (ref 2.5–4.6)

## 2015-06-06 LAB — POCT I-STAT 3, ART BLOOD GAS (G3+)
Acid-base deficit: 3 mmol/L — ABNORMAL HIGH (ref 0.0–2.0)
Bicarbonate: 20 mEq/L (ref 20.0–24.0)
O2 SAT: 96 %
PCO2 ART: 32.5 mmHg — AB (ref 35.0–45.0)
PO2 ART: 86 mmHg (ref 80.0–100.0)
Patient temperature: 38.4
TCO2: 21 mmol/L (ref 0–100)
pH, Arterial: 7.403 (ref 7.350–7.450)

## 2015-06-06 LAB — PROTEIN AND GLUCOSE, CSF
Glucose, CSF: 84 mg/dL — ABNORMAL HIGH (ref 40–70)
Total  Protein, CSF: 62 mg/dL — ABNORMAL HIGH (ref 15–45)

## 2015-06-06 LAB — PROCALCITONIN: PROCALCITONIN: 0.3 ng/mL

## 2015-06-06 LAB — HEMOGLOBIN A1C
HEMOGLOBIN A1C: 5.8 % — AB (ref 4.8–5.6)
MEAN PLASMA GLUCOSE: 120 mg/dL

## 2015-06-06 LAB — MAGNESIUM: Magnesium: 2 mg/dL (ref 1.7–2.4)

## 2015-06-06 MED ORDER — DEXTROSE 5 % IV SOLN: 2.0000 g | INTRAVENOUS | Status: DC

## 2015-06-06 MED ORDER — SODIUM CHLORIDE 0.9 % IV BOLUS (SEPSIS)
1000.0000 mL | Freq: Once | INTRAVENOUS | Status: AC
  Administered 2015-06-06: 1000 mL via INTRAVENOUS

## 2015-06-06 MED ORDER — ACETAMINOPHEN 160 MG/5ML PO SOLN
650.0000 mg | Freq: Four times a day (QID) | ORAL | Status: DC | PRN
  Administered 2015-06-06 – 2015-07-07 (×41): 650 mg
  Filled 2015-06-06 (×43): qty 20.3

## 2015-06-06 MED ORDER — SODIUM CHLORIDE 0.9 % IV SOLN
3.0000 g | Freq: Three times a day (TID) | INTRAVENOUS | Status: DC
  Administered 2015-06-06 – 2015-06-07 (×3): 3 g via INTRAVENOUS
  Filled 2015-06-06 (×5): qty 3

## 2015-06-06 MED ORDER — ADULT MULTIVITAMIN W/MINERALS CH
1.0000 | ORAL_TABLET | Freq: Every day | ORAL | Status: DC
  Administered 2015-06-06 – 2015-07-02 (×27): 1
  Filled 2015-06-06 (×27): qty 1

## 2015-06-06 MED ORDER — DEXTROSE 5 % IV SOLN
10.0000 mg/kg | Freq: Three times a day (TID) | INTRAVENOUS | Status: DC
  Administered 2015-06-06 – 2015-06-07 (×3): 550 mg via INTRAVENOUS
  Filled 2015-06-06 (×5): qty 11

## 2015-06-06 MED ORDER — DEXTROSE 5 % IV SOLN
2.0000 g | Freq: Two times a day (BID) | INTRAVENOUS | Status: DC
  Administered 2015-06-06 – 2015-06-07 (×2): 2 g via INTRAVENOUS
  Filled 2015-06-06 (×3): qty 2

## 2015-06-06 MED ORDER — VANCOMYCIN HCL IN DEXTROSE 1-5 GM/200ML-% IV SOLN
1000.0000 mg | Freq: Once | INTRAVENOUS | Status: AC
  Administered 2015-06-06: 1000 mg via INTRAVENOUS
  Filled 2015-06-06: qty 200

## 2015-06-06 MED ORDER — VANCOMYCIN HCL 500 MG IV SOLR
500.0000 mg | Freq: Two times a day (BID) | INTRAVENOUS | Status: DC
  Administered 2015-06-07: 500 mg via INTRAVENOUS
  Filled 2015-06-06 (×2): qty 500

## 2015-06-06 NOTE — Progress Notes (Signed)
PULMONARY / CRITICAL CARE MEDICINE   Name: Gregory Parker MRN: [id number] DOB: 26-Feb-1956    ADMISSION DATE:  06/04/2015 CONSULTATION DATE:  06/04/2015  REFERRING MD:  EDP at [facility]  CHIEF COMPLAINT:  AMS  HISTORY OF PRESENT ILLNESS:  Patient is encephalopathic, therefore; HPI obtained from chart review. 60 year old male with PMH as below, which is significant for HTN, ETOH, and CVA. 2/27 EMS was dispatched to the patient's home due to reports of shaking episodes with decreased responsiveness. He has a history of ETOH abuse and family reports that last drink was today, and he has only had one drink today. Family also reports that he may have smoked crack over the weekend. He was last seen normal 430 PM. In the emergency department, staff noted urinary incontinence, leftward gaze deviation, R facial droop, and posturing on the L side. He then proceeded to have seizure like activity with limited airway protection, and thus was intubated by EDP. He had a CT of the head which showed, no acute abnormality, but remote basal ganglia infarcts. He was loaded with 1G IV Keppra and started on propofol for sedation/seizure management. He was transferred to [facility] for ICU admission and neurology evaluation.    SUBJECTIVE:  Sedated/intubated on vent support. Ocassional twitching of lower extremities and left hand noted.  VITAL SIGNS: BP 171/95 mmHg  Pulse 120  Temp(Src) 104 F (40 C) (Core (Comment))  Resp 21  Ht 5\' 4"  (1.626 m)  Wt 55 kg (121 lb 4.1 oz)  BMI 20.80 kg/m2  SpO2 100%  HEMODYNAMICS:    VENTILATOR SETTINGS: Vent Mode:  [-] PRVC FiO2 (%):  [40 %] 40 % Set Rate:  [14 bmp] 14 bmp Vt Set:  [500 mL] 500 mL PEEP:  [5 cmH20] 5 cmH20 Pressure Support:  [10 cmH20] 10 cmH20 Plateau Pressure:  [20 cmH20-23 cmH20] 20 cmH20  INTAKE / OUTPUT: I/O last 3 completed shifts: In: 2470.1 [I.V.:1730.8; NG/GT:469.3; IV Piggyback:270] Out: 1990 [Urine:1990]  PHYSICAL EXAMINATION: General:  AA male, in NAD. Neuro: Sedated, grimaces with pain   HEENT: [place]/AT. PERRL, sclerae anicteric. Cardiovascular: Tachy, regular, no M/R/G.  Lungs: Respirations even and unlabored.  Coarse bilaterally. Abdomen: BS x 4, soft, NT/ND.  Musculoskeletal: No gross deformities, no edema.  Skin: Intact, warm, no rashes.   BMET  Recent Labs Lab 06/04/15 1753 06/04/15 1758 06/05/15 0055 06/06/15 0338  NA 139 142 141 139  K 3.8 3.8 3.9 3.5  CL 103 102 106 108  CO2 26  --  22 20*  BUN 11 11 7 10   CREATININE 0.93 0.90 0.97 1.02  GLUCOSE 102* 97 122* 117*    Electrolytes  Recent Labs Lab 06/04/15 1753 06/05/15 0055 06/06/15 0338  CALCIUM 10.1 9.3 9.1  MG  --  1.6* 2.0  PHOS  --  3.0 2.8    CBC  Recent Labs Lab 06/04/15 1753 06/04/15 1758 06/05/15 0055 06/06/15 0338  WBC 6.2  --  7.5 10.5  HGB 15.7 17.3* 13.7 13.7  HCT 44.4 51.0 39.3 40.7  PLT 182  --  158 172    Coag's  Recent Labs Lab 06/04/15 1753  INR 0.97    Sepsis Markers  Recent Labs Lab 06/04/15 1800 06/04/15 2308 06/05/15 0414 06/06/15 0338  LATICACIDVEN 1.24 1.3  --   --   PROCALCITON  --   --  0.21 0.30    ABG  Recent Labs Lab 06/04/15 1855 06/05/15 0545 06/06/15 0333  PHART 7.471* 7.489* 7.403  PCO2ART 32.0* 29.6* 32.5*  PO2ART 72.8* 193* 86.0    Liver Enzymes  Recent Labs Lab 06/04/15 1753  AST 27  ALT 22  ALKPHOS 75  BILITOT 0.7  ALBUMIN 4.6    Cardiac Enzymes  Recent Labs Lab 06/04/15 2308  TROPONINI 0.04*    Glucose  Recent Labs Lab 06/04/15 1749 06/04/15 2138 06/05/15 1942 06/05/15 2345 06/06/15 0340 06/06/15 0818  GLUCAP 90 128* 114* 118* 122* 134*    Imaging Ct Angio Head W/cm &/or Wo Cm  06/05/2015  CLINICAL DATA:  Patient found unresponsive. History of hypertension, crack cocaine, and ETOH abuse. Right-sided weakness. EXAM: CT ANGIOGRAPHY HEAD AND NECK TECHNIQUE: Multidetector CT imaging of the head and neck was performed using the standard  protocol during bolus administration of intravenous contrast. Multiplanar CT image reconstructions and MIPs were obtained to evaluate the vascular anatomy. Carotid stenosis measurements (when applicable) are obtained utilizing NASCET criteria, using the distal internal carotid diameter as the denominator. CONTRAST:  50mL OMNIPAQUE IOHEXOL 350 MG/ML SOLN COMPARISON:  MR brain earlier today showed acute LEFT basal ganglia and periventricular white matter infarct. FINDINGS: CT HEAD Calvarium and skull base: No fracture or destructive lesion. Mastoids and middle ears are grossly clear. Paranasal sinuses: Imaged portions are clear. Orbits: Negative. Brain: Developing cytotoxic edema, LEFT basal ganglia and periventricular white matter, corresponding to restricted diffusion on MRI. No hemorrhagic transformation. Advanced atrophy with chronic microvascular ischemic change. Large area of remote infarction RIGHT basal ganglia. Smaller LEFT cerebellar infarct. CTA NECK Aortic arch: Bovine trunk. Imaged portion shows no evidence of aneurysm or dissection. No significant stenosis of the major arch vessel origins. Minor atheromatous change LEFT subclavian origin. Right carotid system: No evidence of dissection, stenosis (50% or greater) or occlusion. Left carotid system: No evidence of dissection, stenosis (50% or greater) or occlusion. Vertebral arteries: LEFT vertebral dominant. No evidence of dissection, stenosis (50% or greater) or occlusion. Nonvascular soft tissues: Spondylosis. No lung apex lesion. Intubated. Orogastric tube. No neck masses. CTA HEAD Anterior circulation: No significant stenosis, proximal occlusion, aneurysm, or vascular malformation. Posterior circulation: No significant stenosis, proximal occlusion, aneurysm, or vascular malformation. Venous sinuses: As permitted by contrast timing, patent. Anatomic variants: None of significance. Delayed phase:   No abnormal intracranial enhancement. IMPRESSION: No  extracranial or intracranial stenosis of significance to explain the patient's acute LEFT basal ganglia infarct. Specifically no LEFT MCA dissection or thrombus. Chronic changes as described. Developing cytotoxic edema LEFT basal ganglia and white matter corresponding to restricted diffusion on MR. No hemorrhagic transformation. Electronically Signed   By: Elsie Stain M.D.   On: 06/05/2015 12:23   Ct Angio Neck W/cm &/or Wo/cm  06/05/2015  CLINICAL DATA:  Patient found unresponsive. History of hypertension, crack cocaine, and ETOH abuse. Right-sided weakness. EXAM: CT ANGIOGRAPHY HEAD AND NECK TECHNIQUE: Multidetector CT imaging of the head and neck was performed using the standard protocol during bolus administration of intravenous contrast. Multiplanar CT image reconstructions and MIPs were obtained to evaluate the vascular anatomy. Carotid stenosis measurements (when applicable) are obtained utilizing NASCET criteria, using the distal internal carotid diameter as the denominator. CONTRAST:  50mL OMNIPAQUE IOHEXOL 350 MG/ML SOLN COMPARISON:  MR brain earlier today showed acute LEFT basal ganglia and periventricular white matter infarct. FINDINGS: CT HEAD Calvarium and skull base: No fracture or destructive lesion. Mastoids and middle ears are grossly clear. Paranasal sinuses: Imaged portions are clear. Orbits: Negative. Brain: Developing cytotoxic edema, LEFT basal ganglia and periventricular white matter, corresponding to restricted diffusion  on MRI. No hemorrhagic transformation. Advanced atrophy with chronic microvascular ischemic change. Large area of remote infarction RIGHT basal ganglia. Smaller LEFT cerebellar infarct. CTA NECK Aortic arch: Bovine trunk. Imaged portion shows no evidence of aneurysm or dissection. No significant stenosis of the major arch vessel origins. Minor atheromatous change LEFT subclavian origin. Right carotid system: No evidence of dissection, stenosis (50% or greater) or  occlusion. Left carotid system: No evidence of dissection, stenosis (50% or greater) or occlusion. Vertebral arteries: LEFT vertebral dominant. No evidence of dissection, stenosis (50% or greater) or occlusion. Nonvascular soft tissues: Spondylosis. No lung apex lesion. Intubated. Orogastric tube. No neck masses. CTA HEAD Anterior circulation: No significant stenosis, proximal occlusion, aneurysm, or vascular malformation. Posterior circulation: No significant stenosis, proximal occlusion, aneurysm, or vascular malformation. Venous sinuses: As permitted by contrast timing, patent. Anatomic variants: None of significance. Delayed phase:   No abnormal intracranial enhancement. IMPRESSION: No extracranial or intracranial stenosis of significance to explain the patient's acute LEFT basal ganglia infarct. Specifically no LEFT MCA dissection or thrombus. Chronic changes as described. Developing cytotoxic edema LEFT basal ganglia and white matter corresponding to restricted diffusion on MR. No hemorrhagic transformation. Electronically Signed   By: Elsie Stain M.D.   On: 06/05/2015 12:23   Dg Chest Port 1 View  06/06/2015  CLINICAL DATA:  Check endotracheal tube placement EXAM: PORTABLE CHEST 1 VIEW COMPARISON:  06/05/2015 FINDINGS: Cardiac shadow is stable. An endotracheal tube and nasogastric catheter are again seen and stable in appearance pre the lungs are well aerated bilaterally. Some very patchy left perihilar changes are noted. No bony abnormality is noted. IMPRESSION: Mild patchy left perihilar changes. A portion of this may be projectional in nature given some patient rotation. Electronically Signed   By: Alcide Clever M.D.   On: 06/06/2015 07:48     STUDIES:  CT head 2/27 >  No acute intracranial abnormality. Remote bilateral basal ganglia lacunar infarcts. Apparent soft tissue fullness in the nasopharynx could relate to retained secretions, especially given absence of mastoid effusions.    CULTURES: BCx2 2/27 >  ANTIBIOTICS: None.  SIGNIFICANT EVENTS: 2/27 AMS, ? Seizure, intubated in [facility] ED, tx to [facility] ICU  LINES/TUBES: ETT 2/27>>>  ASSESSMENT / PLAN:  NEUROLOGIC A:   Seizures with concern for status epilepticus. Hx ETOH and polysubstance abuse (crack cocaine). P:   Continue Sedation: Propofol gtt. RASS goal: 0 to -1. Keppra incresed by Neuro ContinueThiamine / Folate. MRI per neurology. Further recommendations per neurology. LP today. Start bacterial and viral meningitis coverage until LP comes back negative.  PULMONARY A: Acute hypoxemic respiratory failure secondary to seizures. P:   Continue full vent support  Trend ABG. VAP prevention measures. CXR in AM. Begin PS trials.  CARDIOVASCULAR A:  H/o HTN. P:  Monitor hemodynamics Trend Troponin PRN Hydralazine for SBP>160.  RENAL A:   No acute issues. P:   KVO IVF. Trend BMP Replace electrolytes as indicated.  GASTROINTESTINAL A:   ETOH abuse. GI prophylaxis. Nutrition. P:   Continue protonix. Consult nutrition for TF as per nutrition.  HEMATOLOGIC A:   VTE prophylaxis. P:  Continue SCD's / heparin. Trend CBC  INFECTIOUS A:   Concern for meningitis  P:   LP 3/1>>> Vancomycin 3/1>>> Rocephin 3/1>>> Amp 3/1>>> Acycolovir 3/1>>>  Follow cultures   ENDOCRINE A:   No acute issues.   P:   No interventions required.  The patient is critically ill with multiple organ systems failure and requires high complexity decision making  for assessment and support, frequent evaluation and titration of therapies, application of advanced monitoring technologies and extensive interpretation of multiple databases.   Critical Care Time devoted to patient care services described in this note is  35  Minutes. This time reflects time of care of this signee Dr Koren Bound. This critical care time does not reflect procedure time, or teaching time or supervisory time of PA/NP/Med  student/Med Resident etc but could involve care discussion time.  Alyson Reedy, M.D. [facility] Pulmonary/Critical Care Medicine. Pager: [phone]. After hours pager: [phone].

## 2015-06-06 NOTE — Progress Notes (Deleted)
Procedure Note: Lumbar puncture  Indications: assess for CNS pathology   Operator: Brooklyne Radke, MD, PhD  Others present: RN and CMA  Emergent consent obtained by two physician consent, me and Dr. Yaoub. Time-out was performed, with all individuals present agreeing on the procedure to be performed, the site of procedure, and the patient identity.  Patient positioned, prepped and draped in usual sterile fashion. L3-4 space located using bilateral iliac crests as landmarks. 1% Lidocaine without epinephrine was used to anesthetize the area. An 20G spinal needle was introduced into the sub- arachnoid space. Stylet was removed with appropriate fluid return. Needle removed after adequate fluid collected. Blood loss was minimal. A dry guaze dressing was placed over insertion site. Patient tolerated the procedure well and no complications were observed. Spontaneous movement of bilateral extremities were observed after the procedure.  Opening pressure: 17.5 cm H20 ( legs hemi-flexed)   Total fluid removed: 12cc  Color of fluid: clear  Sent for: cell count + diff , gram stain, cultures, glucose, protein, crypto antigen, HSV PCR, VDRL, enterovirus PCR, VZV PCR  Aijah Lattner, MD PhD Stroke Neurology 06/06/2015 4:19 PM    

## 2015-06-06 NOTE — Progress Notes (Signed)
vLTM EEG running. Push button tested, notified reading Doctor

## 2015-06-06 NOTE — Procedures (Signed)
Procedure Note: Lumbar puncture  Indications: assess for CNS pathology   Operator: Marvel Plan, MD, PhD  Others present: RN and CMA  Emergent consent obtained by two physician consent, me and Dr. Jonette Mate. Time-out was performed, with all individuals present agreeing on the procedure to be performed, the site of procedure, and the patient identity.  Patient positioned, prepped and draped in usual sterile fashion. L3-4 space located using bilateral iliac crests as landmarks. 1% Lidocaine without epinephrine was used to anesthetize the area. An 20G spinal needle was introduced into the sub- arachnoid space. Stylet was removed with appropriate fluid return. Needle removed after adequate fluid collected. Blood loss was minimal. A dry guaze dressing was placed over insertion site. Patient tolerated the procedure well and no complications were observed. Spontaneous movement of bilateral extremities were observed after the procedure.  Opening pressure: 17.5 cm H20 ( legs hemi-flexed)   Total fluid removed: 12cc  Color of fluid: clear  Sent for: cell count + diff , gram stain, cultures, glucose, protein, crypto antigen, HSV PCR, VDRL, enterovirus PCR, VZV PCR  Marvel Plan, MD PhD Stroke Neurology 06/06/2015 4:19 PM

## 2015-06-06 NOTE — Progress Notes (Addendum)
STROKE TEAM PROGRESS NOTE   SUBJECTIVE (INTERVAL HISTORY) His RN is at the bedside.  Pt still intubated and off sedation, not following commands or open eyes. Continued to have high grade fever. Continued to have stimulation induced posturing and shivering. Need LP to rule out CNS infection and antibiotics empiric treatment. Will also need LTM EEG to rule out seizure.    OBJECTIVE Temp:  [100 F (37.8 C)-104.7 F (40.4 C)] 104.7 F (40.4 C) (03/01 1300) Pulse Rate:  [110-131] 111 (03/01 1256) Cardiac Rhythm:  [-] Sinus tachycardia (03/01 1200) Resp:  [13-31] 17 (03/01 1300) BP: (127-191)/(82-122) 161/92 mmHg (03/01 1300) SpO2:  [98 %-100 %] 99 % (03/01 1256) FiO2 (%):  [40 %] 40 % (03/01 1256) Weight:  [121 lb 4.1 oz (55 kg)] 121 lb 4.1 oz (55 kg) (03/01 0445)  CBC:  Recent Labs Lab 06/04/15 1753  06/05/15 0055 06/06/15 0338  WBC 6.2  --  7.5 10.5  NEUTROABS 4.2  --   --  8.4*  HGB 15.7  < > 13.7 13.7  HCT 44.4  < > 39.3 40.7  MCV 90.1  --  89.1 89.5  PLT 182  --  158 172  < > = values in this interval not displayed.  Basic Metabolic Panel:   Recent Labs Lab 06/05/15 0055 06/06/15 0338  NA 141 139  K 3.9 3.5  CL 106 108  CO2 22 20*  GLUCOSE 122* 117*  BUN 7 10  CREATININE 0.97 1.02  CALCIUM 9.3 9.1  MG 1.6* 2.0  PHOS 3.0 2.8    Lipid Panel:     Component Value Date/Time   CHOL 114 06/05/2015 0538   TRIG 73 06/05/2015 0538   HDL 36* 06/05/2015 0538   CHOLHDL 3.2 06/05/2015 0538   VLDL 15 06/05/2015 0538   LDLCALC 63 06/05/2015 0538   HgbA1c:  Lab Results  Component Value Date   HGBA1C 5.8* 06/05/2015   Urine Drug Screen:     Component Value Date/Time   LABOPIA NONE DETECTED 06/04/2015 1840   COCAINSCRNUR NONE DETECTED 06/04/2015 1840   LABBENZ NONE DETECTED 06/04/2015 1840   AMPHETMU NONE DETECTED 06/04/2015 1840   THCU NONE DETECTED 06/04/2015 1840   LABBARB NONE DETECTED 06/04/2015 1840      IMAGING I have personally reviewed the  radiological images below and agree with the radiology interpretations.  Ct Head Wo Contrast 06/04/2015  1.  No acute intracranial abnormality. 2. Remote bilateral basal ganglia lacunar infarcts. 3. Apparent soft tissue fullness in the nasopharynx could relate to retained secretions, especially given absence of mastoid effusions. Consider physical exam correlation to exclude unlikely soft tissue mass in this area.   Mr Brain Wo Contrast 06/05/2015   Acute LEFT basal ganglia lacunar infarct. Old bilateral basal ganglia and thalamus lacunar infarcts. Old small LEFT cerebellar infarct. Moderate to severe chronic small vessel ischemic disease.   Dg Chest Port 1 View 06/05/2015  1. Lines and tubes stable position. 2. Low lung volumes with mild bibasilar atelectasis  06/04/2015   1. Endotracheal tube tip well-positioned 4 cm above the carina. 2. Clear lungs.  06/04/2015   Endotracheal tube in the right mainstem bronchus. Recommend retraction and repositioning by approximately 5-6 cm. Mild left lung base interstitial markings.  06/04/2015  1. Endotracheal tube tip now projects 5.5 cm above the carina. No other change from the prior study.   CTA head and neck No extracranial or intracranial stenosis of significance to explain the patient's acute LEFT basal  ganglia infarct. Specifically no LEFT MCA dissection or thrombus. Chronic changes as described. Developing cytotoxic edema LEFT basal ganglia and white matter corresponding to restricted diffusion on MR. No hemorrhagic transformation.  2D echo - Normal LV size and systolic function, EF 60-65%. Normal RV size and systolic function. No significant valvular abnormalities. Small circumferential pericardial effusion.  EEG - This is an unremarkable awake and drowsy routine inpatient EEG. Clinical correlation is recommended .   LTM EEG pending   PHYSICAL EXAM  Temp:  [100 F (37.8 C)-104.7 F (40.4 C)] 104.7 F (40.4 C) (03/01 1300) Pulse Rate:   [110-131] 111 (03/01 1256) Resp:  [13-31] 17 (03/01 1300) BP: (127-191)/(82-122) 161/92 mmHg (03/01 1300) SpO2:  [98 %-100 %] 99 % (03/01 1256) FiO2 (%):  [40 %] 40 % (03/01 1256) Weight:  [121 lb 4.1 oz (55 kg)] 121 lb 4.1 oz (55 kg) (03/01 0445)  General - thin built, well developed, intubated on vent off sedation.  Ophthalmologic - Fundi not visualized due to ET tube and positioning.  Cardiovascular - Regular rhythm, but tachycardia.  Neuro - intubated, off sedation, not open eyes on voice or pain, not following commands. PERRL, doll's eye present, eyes in middle position, positive corneal and gag. On stimulation, LLE withdraws against gravity, but LUE and RLE posturing with extension, RUE minimal movement. Bilateral increased tone UEs and LEs. Bilaterally babinski positive, DTR 1+. Sensation, coordination and gait not tested.   ASSESSMENT/PLAN [name] is a 60 y.o. male with history of HTN, ETOH abuse, and prior CVAs found unresponsive and shaking at home. Found to have urinary incontinence, leftward gaze deviation, right sided facial droop, and posturing on the left side. At AP, loaded with Keppra and intubated. Transferred to [facility] for treatment.  He did not receive IV t-PA.   AMS and high grade fever   Intubated, stuporous  Blood culture pending  Sputum culture pending  LP done not supportive for CNS infection  On ampicillin, rocephin, vanco and acyclovir for CNS infection empiric treatment  CSF culture pending  UA negative  CXR not remarkable  ? Delirium tremor  Heavy alcoholic  Alcohol level undetectable this time  AMS with stuporous needing intubation  Stimulation induced bilateral posturing  Intubated and on FA, B1 and MVit  Supportive care  Seziure - ? Alcohol withdraw seizure  Hx seizures, status epilepticus  Stimulation induced b/l posturing  Recent heavy ETOH and cocaine use per family, though UDS & ETOH levels negative  Question for  ETOH withdrawal seizure  Loaded with Keppra. On 1000 bid.  EEG unremarkable  LTM EEG pending  Seizure precautions.  Acute Respiratory Failure  Intubated  Sedated initially, off propofol now  CCM on board  Stroke:  left basal ganglia infarct, but not able to explain presenting symptoms, likely secondary to small vessel disease source   MRI  L BG small infarct. Old bilateral basal ganglia and thalamic infarcts. Old L cerebellar infarct.  CTA head and neck unremarkable   2D Echo  EF 60-65%   May need to consider TEE to rule out endocarditis if blood culture positive  LDL 63  HgbA1c 5.8  Heparin 5000 units sq tid for VTE prophylaxis Diet NPO time specified  No antithrombotic prior to admission, now on aspirin 300 mg suppository daily.  Ongoing aggressive stroke risk factor management  Therapy recommendations:  pending   Disposition:  pending  (lives with niece)  Hypertension  Elevated diastolic 110s  From stroke standpoint, Stable Permissive hypertension (  OK if < 220/120) but gradually normalize in 5-7 days  Alcohol abuse  Alcohol not detectable  On FA, MVit, and B1  CIWA protocol  tachycardia  Other Stroke Risk Factors  Crack Cocaine use - UDS negative this admission  Other Active Problems  tachycardia  Hospital day # 2  This patient is critically ill due to respiratory failure, acute stroke, seizure and at significant risk of neurological worsening, death form status epilepticus, heart failure, recurrent stroke, and DT. This patient's care requires constant monitoring of vital signs, hemodynamics, respiratory and cardiac monitoring, review of multiple databases, neurological assessment, discussion with family, other specialists and medical decision making of high complexity. I spent 45 minutes of neurocritical care time in the care of this patient.   Talea Manges, MD [facility] 06/06/2015 4:23 PM    To contact Stroke Continuity  provider, please refer to [email or web address]. After hours, contact General Neurology

## 2015-06-06 NOTE — Progress Notes (Signed)
Pharmacy Antibiotic Note  Gregory Parker is a 60 y.o. male admitted on 06/04/2015 with meningitis.  Pharmacy has been consulted for vancomycin, acyclovir and unasyn dosing. Tmax is 104, WBC is WNL and Scr is WNL. Cultures are pending.   Plan: - Vancomycin 1gm IV x 1 then  IV Q12H - Acyclovir /kg IV Q8H - Unasyn 3gm IV Q8H - F/u renal fxn, C&S, clinical status and trough at SS  Height:  (162.6 cm) Weight: 121 lb 4.1 oz (55 kg) IBW/kg (Calculated) : 59.2  Temp (24hrs), Avg:101.5 F (38.6 C), Min:99.9 F (37.7 C), Max:104 F (40 C)   Recent Labs Lab 06/04/15 1753 06/04/15 1758 06/04/15 1800 06/04/15 2308 06/05/15 0055 06/06/15 0338  WBC 6.2  --   --   --  7.5 10.5  CREATININE 0.93 0.90  --   --  0.97 1.02  LATICACIDVEN  --   --  1.24 1.3  --   --     Estimated Creatinine Clearance: 59.2 mL/min (by C-G formula based on Cr of 1.02).    No Known Allergies  Antimicrobials this admission: Vanc 3/1>> Acyclovir 3/1>> Unasyn 3/1>> CTX 3/1>>  Dose adjustments this admission: N/A  Microbiology results: Pending  Thank you for allowing pharmacy to be a part of this patient's care.  Mirca Yale, Drake Leach 06/06/2015 12:30 PM

## 2015-06-06 NOTE — Progress Notes (Signed)
SLP Cancellation Note  Patient Details Name: Gregory Parker MRN: [id number] DOB: 09-08-1955   Cancelled treatment:       Reason Eval/Treat Not Completed: Medical issues which prohibited therapy (Patient intubated. Will f/u 3/2. ).    Ferdinand Lango MA, CCC-SLP [phone]    Haygen Zebrowski Meryl 06/06/2015, 9:01 AM

## 2015-06-06 NOTE — Progress Notes (Signed)
eLink Physician-Brief Progress Note Patient Name: Gregory Parker DOB: Nov 06, 1955 MRN: [id number]   Date of Service  06/06/2015  HPI/Events of Note  Oliguria - LVEF = 60% to 65%.  eICU Interventions  Bolus with 0.9 NaCl 1 liter IV over 1 hour now.      Intervention Category Intermediate Interventions: Oliguria - evaluation and management  Sommer,Steven Eugene 06/06/2015, 1:28 AM

## 2015-06-07 ENCOUNTER — Inpatient Hospital Stay (HOSPITAL_COMMUNITY): Payer: Medicare Other

## 2015-06-07 DIAGNOSIS — G210 Malignant neuroleptic syndrome: Secondary | ICD-10-CM

## 2015-06-07 DIAGNOSIS — T883XXA Malignant hyperthermia due to anesthesia, initial encounter: Secondary | ICD-10-CM

## 2015-06-07 LAB — CBC
HCT: 40.5 % (ref 39.0–52.0)
HEMOGLOBIN: 14 g/dL (ref 13.0–17.0)
MCH: 31.4 pg (ref 26.0–34.0)
MCHC: 34.6 g/dL (ref 30.0–36.0)
MCV: 90.8 fL (ref 78.0–100.0)
Platelets: 129 10*3/uL — ABNORMAL LOW (ref 150–400)
RBC: 4.46 MIL/uL (ref 4.22–5.81)
RDW: 14 % (ref 11.5–15.5)
WBC: 10.6 10*3/uL — ABNORMAL HIGH (ref 4.0–10.5)

## 2015-06-07 LAB — HERPES SIMPLEX VIRUS(HSV) DNA BY PCR
HSV 1 DNA: NEGATIVE
HSV 2 DNA: NEGATIVE

## 2015-06-07 LAB — BASIC METABOLIC PANEL
ANION GAP: 11 (ref 5–15)
BUN: 16 mg/dL (ref 6–20)
CHLORIDE: 109 mmol/L (ref 101–111)
CO2: 18 mmol/L — ABNORMAL LOW (ref 22–32)
Calcium: 8.3 mg/dL — ABNORMAL LOW (ref 8.9–10.3)
Creatinine, Ser: 1.05 mg/dL (ref 0.61–1.24)
GFR calc Af Amer: >60 mL/min (ref >60)
GLUCOSE: 135 mg/dL — AB (ref 65–99)
POTASSIUM: 4.1 mmol/L (ref 3.5–5.1)
Sodium: 138 mmol/L (ref 135–145)

## 2015-06-07 LAB — POCT I-STAT 3, ART BLOOD GAS (G3+)
ACID-BASE DEFICIT: 3 mmol/L — AB (ref 0.0–2.0)
Bicarbonate: 20.4 mEq/L (ref 20.0–24.0)
O2 SAT: 98 %
PCO2 ART: 34.2 mmHg — AB (ref 35.0–45.0)
PH ART: 7.393 (ref 7.350–7.450)
Patient temperature: 102.8
TCO2: 21 mmol/L (ref 0–100)
pO2, Arterial: 118 mmHg — ABNORMAL HIGH (ref 80.0–100.0)

## 2015-06-07 LAB — GLUCOSE, CAPILLARY
GLUCOSE-CAPILLARY: 140 mg/dL — AB (ref 65–99)
GLUCOSE-CAPILLARY: 132 mg/dL — AB (ref 65–99)
Glucose-Capillary: 114 mg/dL — ABNORMAL HIGH (ref 65–99)
Glucose-Capillary: 92 mg/dL (ref 65–99)
Glucose-Capillary: 106 mg/dL — ABNORMAL HIGH (ref 65–99)
Glucose-Capillary: 116 mg/dL — ABNORMAL HIGH (ref 65–99)

## 2015-06-07 LAB — CK: Total CK: 5067 U/L — ABNORMAL HIGH (ref 49–397)

## 2015-06-07 LAB — CULTURE, RESPIRATORY W GRAM STAIN

## 2015-06-07 LAB — MAGNESIUM: Magnesium: 1.7 mg/dL (ref 1.7–2.4)

## 2015-06-07 LAB — PROCALCITONIN: PROCALCITONIN: 1.99 ng/mL

## 2015-06-07 LAB — VDRL, CSF: VDRL Quant, CSF: NONREACTIVE

## 2015-06-07 LAB — PHOSPHORUS: Phosphorus: 2.9 mg/dL (ref 2.5–4.6)

## 2015-06-07 LAB — CULTURE, RESPIRATORY

## 2015-06-07 MED ORDER — SODIUM BICARBONATE 8.4 % IV SOLN
INTRAVENOUS | Status: DC
  Administered 2015-06-07 (×2): via INTRAVENOUS
  Filled 2015-06-07 (×6): qty 150

## 2015-06-07 MED ORDER — THIAMINE HCL 100 MG/ML IJ SOLN: Freq: Once | INTRAVENOUS | Status: DC

## 2015-06-07 MED ORDER — SODIUM CHLORIDE 0.9 % IV SOLN
INTRAVENOUS | Status: DC
  Administered 2015-06-07: 03:00:00 via INTRAVENOUS

## 2015-06-07 MED ORDER — ASPIRIN 325 MG PO TABS
325.0000 mg | ORAL_TABLET | Freq: Every day | ORAL | Status: DC
  Administered 2015-06-07 – 2015-06-21 (×15): 325 mg via ORAL
  Filled 2015-06-07 (×15): qty 1

## 2015-06-07 MED ORDER — SODIUM CHLORIDE 0.9 % IV SOLN: 250.0000 mL | INTRAVENOUS | Status: DC | PRN

## 2015-06-07 MED ORDER — EMPTY CONTAINERS FLEXIBLE MISC
1.0000 mg/kg | Status: AC
  Administered 2015-06-08 – 2015-06-10 (×13): 60.2 mg via INTRAVENOUS
  Filled 2015-06-07 (×20): qty 180.6

## 2015-06-07 MED ORDER — EMPTY CONTAINERS FLEXIBLE MISC
1.0000 mg/kg | Freq: Once | Status: AC
  Administered 2015-06-07: 60.2 mg via INTRAVENOUS
  Filled 2015-06-07: qty 180.6

## 2015-06-07 MED ORDER — EMPTY CONTAINERS FLEXIBLE MISC
2.5000 mg/kg | Freq: Once | Status: AC
  Administered 2015-06-07: 150.5 mg via INTRAVENOUS
  Filled 2015-06-07: qty 451.5

## 2015-06-07 MED ORDER — DEXTROSE 5 % IV SOLN
1.0000 g | Freq: Every day | INTRAVENOUS | Status: DC
  Administered 2015-06-07 – 2015-06-09 (×3): 1 g via INTRAVENOUS
  Filled 2015-06-07 (×4): qty 10

## 2015-06-07 NOTE — Progress Notes (Signed)
STROKE TEAM PROGRESS NOTE   SUBJECTIVE (INTERVAL HISTORY) His RN is at the bedside.  Pt still intubated and off sedation, not following commands or open eyes. Continued to have high grade fever. Continued to have stimulation induced posturing and shivering. LP profile is not consistent with CNS infection. LTM EEG did not show seizure activity.   Due to high grade fever, AMS, tachycardia, tachypnea, muscle stiffness, elevated CK level, NMS or malignant hyperthermia has to be considered.     OBJECTIVE Temp:  [98.8 F (37.1 C)-103.6 F (39.8 C)] 100.4 F (38 C) (03/02 1500) Pulse Rate:  [78-125] 121 (03/02 1535) Cardiac Rhythm:  [-] Normal sinus rhythm (03/02 1200) Resp:  [9-39] 39 (03/02 1535) BP: (92-178)/(58-123) 116/79 mmHg (03/02 1535) SpO2:  [96 %-100 %] 100 % (03/02 1535) FiO2 (%):  [40 %] 40 % (03/02 1535) Weight:  [132 lb 11.5 oz (60.2 kg)] 132 lb 11.5 oz (60.2 kg) (03/02 0500)  CBC:  Recent Labs Lab 06/04/15 1753  06/06/15 0338 06/07/15 0735  WBC 6.2  < > 10.5 10.6*  NEUTROABS 4.2  --  8.4*  --   HGB 15.7  < > 13.7 14.0  HCT 44.4  < > 40.7 40.5  MCV 90.1  < > 89.5 90.8  PLT 182  < > 172 129*  < > = values in this interval not displayed.  Basic Metabolic Panel:   Recent Labs Lab 06/06/15 0338 06/07/15 0513  NA 139 138  K 3.5 4.1  CL 108 109  CO2 20* 18*  GLUCOSE 117* 135*  BUN 10 16  CREATININE 1.02 1.05  CALCIUM 9.1 8.3*  MG 2.0 1.7  PHOS 2.8 2.9    Lipid Panel:     Component Value Date/Time   CHOL 114 06/05/2015 0538   TRIG 73 06/05/2015 0538   HDL 36* 06/05/2015 0538   CHOLHDL 3.2 06/05/2015 0538   VLDL 15 06/05/2015 0538   LDLCALC 63 06/05/2015 0538   HgbA1c:  Lab Results  Component Value Date   HGBA1C 5.8* 06/05/2015   Urine Drug Screen:     Component Value Date/Time   LABOPIA NONE DETECTED 06/04/2015 1840   COCAINSCRNUR NONE DETECTED 06/04/2015 1840   LABBENZ NONE DETECTED 06/04/2015 1840   AMPHETMU NONE DETECTED 06/04/2015 1840    THCU NONE DETECTED 06/04/2015 1840   LABBARB NONE DETECTED 06/04/2015 1840      IMAGING I have personally reviewed the radiological images below and agree with the radiology interpretations.  Ct Head Wo Contrast 06/04/2015  1.  No acute intracranial abnormality. 2. Remote bilateral basal ganglia lacunar infarcts. 3. Apparent soft tissue fullness in the nasopharynx could relate to retained secretions, especially given absence of mastoid effusions. Consider physical exam correlation to exclude unlikely soft tissue mass in this area.   Mr Brain Wo Contrast 06/05/2015   Acute LEFT basal ganglia lacunar infarct. Old bilateral basal ganglia and thalamus lacunar infarcts. Old small LEFT cerebellar infarct. Moderate to severe chronic small vessel ischemic disease.   Dg Chest Port 1 View 06/05/2015  1. Lines and tubes stable position. 2. Low lung volumes with mild bibasilar atelectasis  06/04/2015   1. Endotracheal tube tip well-positioned 4 cm above the carina. 2. Clear lungs.  06/04/2015   Endotracheal tube in the right mainstem bronchus. Recommend retraction and repositioning by approximately 5-6 cm. Mild left lung base interstitial markings.  06/04/2015  1. Endotracheal tube tip now projects 5.5 cm above the carina. No other change from the prior study.  CTA head and neck No extracranial or intracranial stenosis of significance to explain the patient's acute LEFT basal ganglia infarct. Specifically no LEFT MCA dissection or thrombus. Chronic changes as described. Developing cytotoxic edema LEFT basal ganglia and white matter corresponding to restricted diffusion on MR. No hemorrhagic transformation.  2D echo - Normal LV size and systolic function, EF 60-65%. Normal RV size and systolic function. No significant valvular abnormalities. Small circumferential pericardial effusion.  EEG - This is an unremarkable awake and drowsy routine inpatient EEG. Clinical correlation is recommended .    LTM EEG This is an abnormal EEG due to the presence of generalized polymorphic delta slowing, suggesting severe encephalopathy, but medication effect (e.g., benzodiazepine, propofol) could not be excluded. No epileptiform discharges or seizures were in evidence.   PHYSICAL EXAM  Temp:  [98.8 F (37.1 C)-103.6 F (39.8 C)] 100.4 F (38 C) (03/02 1500) Pulse Rate:  [78-125] 121 (03/02 1535) Resp:  [9-39] 39 (03/02 1535) BP: (92-178)/(58-123) 116/79 mmHg (03/02 1535) SpO2:  [96 %-100 %] 100 % (03/02 1535) FiO2 (%):  [40 %] 40 % (03/02 1535) Weight:  [132 lb 11.5 oz (60.2 kg)] 132 lb 11.5 oz (60.2 kg) (03/02 0500)  General - thin built, well developed, intubated on vent off sedation.  Ophthalmologic - Fundi not visualized due to ET tube and positioning.  Cardiovascular - Regular rhythm, but tachycardia.  Neuro - intubated, off sedation, not open eyes on voice or pain, not following commands. PERRL, doll's eye present, eyes in middle position, positive corneal and gag. On stimulation, LLE withdraws against gravity, but LUE and RLE posturing with extension, RUE minimal movement. Bilateral increased tone UEs and LEs. Bilaterally babinski positive, DTR 1+. Sensation, coordination and gait not tested.   ASSESSMENT/PLAN [name] is a 60 y.o. male with history of HTN, ETOH abuse, and prior CVAs found unresponsive and shaking at home. Found to have urinary incontinence, leftward gaze deviation, right sided facial droop, and posturing on the left side. At AP, loaded with Keppra and intubated. Transferred to [facility] for treatment.  He did not receive IV t-PA.   ? NMS vs. Malignant hyperthermia   Intubated, coma  High grade fever not responding to traditional measures  Elevated CK  Muscle stiffness throughout  Tachycardia and tachypenia  No typical offending medication has been used so far  Blood culture NGTD  Sputum culture pending  LP done not supportive for CNS  infection  CSF culture NGTD  UA negative  CXR not remarkable  EEG negative   Will empiric treat with dantrolene   ? Delirium tremor  Heavy alcoholic  Alcohol level undetectable this time  AMS with stuporous needing intubation  Stimulation induced bilateral posturing  Intubated and on FA, B1 and MVit  Supportive care  Seziure unlikely  Hx seizures, status epilepticus  Stimulation induced b/l posturing  Loaded with Keppra. On 1000 bid - will discontinue as no evidence of seizure.  EEG unremarkable  LTM EEG no seizure activity  Seizure precautions.  Acute Respiratory Failure  Intubated  Sedated initially, off propofol now  tachypenic  CCM on board  Stroke:  left basal ganglia infarct, but not able to explain presenting symptoms, likely secondary to small vessel disease source   MRI  L BG small infarct. Old bilateral basal ganglia and thalamic infarcts. Old L cerebellar infarct.  CTA head and neck unremarkable   2D Echo  EF 60-65%   May need to consider TEE to rule out endocarditis if blood  culture positive  LDL 63  HgbA1c 5.8  Heparin 5000 units sq tid for VTE prophylaxis Diet NPO time specified  No antithrombotic prior to admission, now on aspirin 300 mg suppository daily.  Ongoing aggressive stroke risk factor management  Therapy recommendations:  pending   Disposition:  pending  (lives with niece)  Hypertension  Stable  Alcohol abuse  Alcohol not detectable  On FA, MVit, and B1  CIWA protocol  Tachycardia, tachypnea  Other Stroke Risk Factors  Hx of Crack Cocaine use - UDS negative this admission  Other Active Problems  Elevated CK  Hospital day # 3  This patient is critically ill due to respiratory failure, acute stroke, high grade fever, increased muscle tone and at significant risk of neurological worsening, death form status epilepticus, heart failure, recurrent stroke, NMS and DT. This patient's care requires  constant monitoring of vital signs, hemodynamics, respiratory and cardiac monitoring, review of multiple databases, neurological assessment, discussion with family, other specialists and medical decision making of high complexity. I spent 50 minutes of neurocritical care time in the care of this patient.   Marvel Plan, MD PhD Stroke Neurology 06/07/2015 6:27 PM    To contact Stroke Continuity provider, please refer to [email or web address]. After hours, contact General Neurology

## 2015-06-07 NOTE — Progress Notes (Signed)
PULMONARY / CRITICAL CARE MEDICINE   Name: Gregory Parker MRN: [id number] DOB: 07-Nov-1955    ADMISSION DATE:  06/04/2015 CONSULTATION DATE:  06/04/2015  REFERRING MD:  EDP at [facility]  CHIEF COMPLAINT:  AMS  HISTORY OF PRESENT ILLNESS:  Patient is encephalopathic, therefore; HPI obtained from chart review. 60 year old male with PMH as below, which is significant for HTN, ETOH, and CVA. 2/27 EMS was dispatched to the patient's home due to reports of shaking episodes with decreased responsiveness. He has a history of ETOH abuse and family reports that last drink was today, and he has only had one drink today. Family also reports that he may have smoked crack over the weekend. He was last seen normal 430 PM. In the emergency department, staff noted urinary incontinence, leftward gaze deviation, R facial droop, and posturing on the L side. He then proceeded to have seizure like activity with limited airway protection, and thus was intubated by EDP. He had a CT of the head which showed, no acute abnormality, but remote basal ganglia infarcts. He was loaded with 1G IV Keppra and started on propofol for sedation/seizure management. He was transferred to [facility] for ICU admission and neurology evaluation.    SUBJECTIVE:  Sedated/intubated on vent support. Ocassional twitching of lower extremities and left hand noted.  VITAL SIGNS: BP 178/101 mmHg  Pulse 112  Temp(Src) 103.6 F (39.8 C) (Core (Comment))  Resp 21  Ht  (1.626 m)  Wt 60.2 kg (132 lb 11.5 oz)  BMI 22.77 kg/m2  SpO2 96%  HEMODYNAMICS:    VENTILATOR SETTINGS: Vent Mode:  [-] PRVC FiO2 (%):  [40 %] 40 % Set Rate:  [14 bmp] 14 bmp Vt Set:  [500 mL] 500 mL PEEP:  [5 cmH20] 5 cmH20 Plateau Pressure:  [19 cmH20] 19 cmH20  INTAKE / OUTPUT: I/O last 3 completed shifts: In: 4716.2 [I.V.:1763.2; NG/GT:1790; IV Piggyback:1163] Out: 1015 [Urine:1015]  PHYSICAL EXAMINATION: General: AA male, in NAD. Neuro: Sedated, grimaces  with pain. HEENT:  [place]/AT. PERRL, sclerae anicteric. Cardiovascular: Tachy, regular, no M/R/G.  Lungs: Respirations even and unlabored.  Coarse bilaterally. Abdomen: BS x 4, soft, NT/ND.  Musculoskeletal: No gross deformities, no edema.  Skin: Intact, warm, no rashes.  BMET  Recent Labs Lab 06/05/15 0055 06/06/15 0338 06/07/15 0513  NA 141 139 138  K 3.9 3.5 4.1  CL 106 108 109  CO2 22 20* 18*  BUN CREATININE 0.97 1.02 1.05  GLUCOSE 122* 117* 135*   Electrolytes  Recent Labs Lab 06/05/15 0055 06/06/15 0338 06/07/15 0513  CALCIUM 9.3 9.1 8.3*  MG 1.6* 2.0 1.7  PHOS 3.0 2.8 2.9   CBC  Recent Labs Lab 06/05/15 0055 06/06/15 0338 06/07/15 0735  WBC 7.5 10.5 10.6*  HGB 13.7 13.7 14.0  HCT 39.3 40.7 40.5  PLT 158 172 129*   Coag's  Recent Labs Lab 06/04/15 1753  INR 0.97   Sepsis Markers  Recent Labs Lab 06/04/15 1800 06/04/15 2308 06/05/15 0414 06/06/15 0338 06/07/15 0513  LATICACIDVEN 1.24 1.3  --   --   --   PROCALCITON  --   --  0.21 0.30 1.99   ABG  Recent Labs Lab 06/05/15 0545 06/06/15 0333 06/07/15 0312  PHART 7.489* 7.403 7.393  PCO2ART 29.6* 32.5* 34.2*  PO2ART 193* 86.0 118.0*   Liver Enzymes  Recent Labs Lab 06/04/15 1753  AST 27  ALT 22  ALKPHOS 75  BILITOT 0.7  ALBUMIN 4.6  Cardiac Enzymes  Recent Labs Lab 06/04/15 2308  TROPONINI 0.04*   Glucose  Recent Labs Lab 06/06/15 1147 06/06/15 1548 06/06/15 1959 06/06/15 2259 06/07/15 0409 06/07/15 0800  GLUCAP 110* 125* 82 114* 140* 92   Imaging Dg Chest Port 1 View  06/07/2015  CLINICAL DATA:  Acute respiratory failure, intubated patient, CVA. EXAM: PORTABLE CHEST 1 VIEW COMPARISON:  Portable chest x-ray of June 06, 2015 FINDINGS: The lungs are less well inflated today. The interstitial markings are more conspicuous. There is some crowding of the pulmonary vascularity. Interval development of bibasilar atelectasis or pneumonia is suspected. There is  no significant pleural effusion and no pneumothorax. The cardiac silhouette is normal in size. The central pulmonary vascularity is more engorged today. The endotracheal tube tip lies approximately 35. cm above the carina. The esophagogastric tube tip projects below the inferior margin of the image. IMPRESSION: Interval worsening in the appearance of the pulmonary vascularity and lung bases consistent with mild CHF and bibasilar atelectasis or pneumonia. The support tubes are in reasonable position. Electronically Signed   By: David  [place] M.D.   On: 06/07/2015 07:55   STUDIES:  CT head 2/27 >  No acute intracranial abnormality. Remote bilateral basal ganglia lacunar infarcts. Apparent soft tissue fullness in the nasopharynx could relate to retained secretions, especially given absence of mastoid effusions.   CULTURES: BCx2 2/27 >  ANTIBIOTICS: None.  SIGNIFICANT EVENTS: 2/27 AMS, ? Seizure, intubated in [facility] ED, tx to [facility] ICU  LINES/TUBES: ETT 2/27>>>  ASSESSMENT / PLAN:  NEUROLOGIC A:   Seizures with concern for status epilepticus. Hx ETOH and polysubstance abuse (crack cocaine). P:   Continue Sedation: Propofol gtt. RASS goal: 0 to -1. Keppra incresed by Neuro ContinueThiamine / Folate. MRI per neurology noted. Further recommendations per neurology. LP negative for infection, will d/c all abx. D/C bacterial and viral meningitis coverage. ?malignant hyperthermia. Start dantrolene. Thiamine/folate/MVI.  PULMONARY A: Acute hypoxemic respiratory failure secondary to seizures. P:   Continue full vent support  Trend ABG. VAP prevention measures. CXR in AM. Hold PS trials for now.  CARDIOVASCULAR A:  H/o HTN. P:  Monitor hemodynamics Trend Troponin PRN Hydralazine for SBP>160.  RENAL A:   CPK >5000. P:   Alkalinize urine. D/C NS. Trend BMP Replace electrolytes as indicated.  GASTROINTESTINAL A:   ETOH abuse. GI prophylaxis. Nutrition. P:   Continue  protonix. Consult nutrition for TF as per nutrition.  HEMATOLOGIC A:   VTE prophylaxis. P:  Continue SCD's / heparin. Trend CBC  INFECTIOUS A:   Concern for meningitis  P:   LP 3/1>>>negative Vancomycin 3/1>>>3/2 Rocephin 3/1>>>3/2 Amp 3/1>>>3/2 Acycolovir 3/1>>>3/2  Follow cultures   ENDOCRINE A:   No acute issues.   P:   No interventions required.  The patient is critically ill with multiple organ systems failure and requires high complexity decision making for assessment and support, frequent evaluation and titration of therapies, application of advanced monitoring technologies and extensive interpretation of multiple databases.   Critical Care Time devoted to patient care services described in this note is  35  Minutes. This time reflects time of care of this signee Dr Koren Bound. This critical care time does not reflect procedure time, or teaching time or supervisory time of PA/NP/Med student/Med Resident etc but could involve care discussion time.  Alyson Reedy, M.D. [facility] Pulmonary/Critical Care Medicine. Pager: [phone]. After hours pager: [phone].

## 2015-06-07 NOTE — Procedures (Signed)
Continuous Video-EEG Monitoring Report   Patient: Gregory Parker, Gregory Parker     EEG No.ID: [id number]     DOB: 23-May-1955 Age: 60 Room#52M   MED REC NO: [id number] Gender: Male   TECH: Amy Zwelling     Physician: Ronnie Doss     Referring Physician: Albertine Grates   Report Date: 06/07/2015     Study Duration: 06/06/2015 12:35 to 06/07/2015 07:30 CPT Code:  36644 Diagnosis:  Altered mental status (R41.82)  History: This is a 60 year old male presenting with altered mental status with body shaking.  Video-EEG monitoring was performed to evaluate for seizures.  Technical Details:  Long-term video-EEG monitoring was performed using standard setting per the guidelines.  Briefly, a minimum of 21 electrodes were placed on scalp according to the International 10-20 or/and 10-10 Systems.  Supplemental electrodes were placed as needed.  Single EKG electrode was also used to detect cardiac arrhythmia.  Patient's behavior was continuously recorded on video simultaneously with EEG.  A minimum of 16 channels were used for data display.  Each epoch of study was reviewed manually daily and as needed using standard referential and bipolar montages.    EEG Description:  There was generalized polymorphic delta slowing.  Diffuse beta activity was present, likely due to medication effect (e.g., benzodiazepine, propofol).  No posterior dominant rhythm or sleep architecture was recorded.  Hyperventilation and photic stimulation was not performed.  No epileptiform discharges or seizures were in evidence.  Intermittent limb posturing and shaking (mostly the left leg) was captured, which had no EEG correlate.  Impression:  This is an abnormal EEG due to the presence of generalized polymorphic delta slowing, suggesting severe encephalopathy, but medication effect (e.g., benzodiazepine, propofol) could not be excluded.  No epileptiform discharges or seizures were in evidence.                Reading Physician: Ronnie Doss, MD, PhD

## 2015-06-07 NOTE — Progress Notes (Signed)
eLink Physician-Brief Progress Note Patient Name: Gregory Parker DOB: 1956-01-03 MRN: [id number]   Date of Service  06/07/2015  HPI/Events of Note  resp cx with Pneumococcus, CXR with evolving opacities.  Will treat for PNA  eICU Interventions  Ceftriaxone     Intervention Category Major Interventions: Infection - evaluation and management  [name] S. 06/07/2015, 10:48 PM

## 2015-06-08 ENCOUNTER — Inpatient Hospital Stay (HOSPITAL_COMMUNITY): Payer: Medicare Other

## 2015-06-08 LAB — CBC
HEMATOCRIT: 34.1 % — AB (ref 39.0–52.0)
HEMOGLOBIN: 12.2 g/dL — AB (ref 13.0–17.0)
MCH: 32 pg (ref 26.0–34.0)
MCHC: 35.8 g/dL (ref 30.0–36.0)
MCV: 89.5 fL (ref 78.0–100.0)
Platelets: 110 10*3/uL — ABNORMAL LOW (ref 150–400)
RBC: 3.81 MIL/uL — ABNORMAL LOW (ref 4.22–5.81)
RDW: 13.9 % (ref 11.5–15.5)
WBC: 6.2 10*3/uL (ref 4.0–10.5)

## 2015-06-08 LAB — BLOOD GAS, ARTERIAL
ACID-BASE EXCESS: 4.6 mmol/L — AB (ref 0.0–2.0)
BICARBONATE: 27.8 meq/L — AB (ref 20.0–24.0)
Drawn by: 42624
FIO2: 0.4
LHR: 14 {breaths}/min
O2 Saturation: 97 %
PATIENT TEMPERATURE: 98.6
PEEP/CPAP: 5 cmH2O
PO2 ART: 87.1 mmHg (ref 80.0–100.0)
TCO2: 29 mmol/L (ref 0–100)
VT: 500 mL
pCO2 arterial: 35.9 mmHg (ref 35.0–45.0)
pH, Arterial: 7.501 — ABNORMAL HIGH (ref 7.350–7.450)

## 2015-06-08 LAB — GLUCOSE, CAPILLARY
GLUCOSE-CAPILLARY: 117 mg/dL — AB (ref 65–99)
GLUCOSE-CAPILLARY: 147 mg/dL — AB (ref 65–99)
GLUCOSE-CAPILLARY: 131 mg/dL — AB (ref 65–99)
GLUCOSE-CAPILLARY: 147 mg/dL — AB (ref 65–99)
GLUCOSE-CAPILLARY: 144 mg/dL — AB (ref 65–99)
Glucose-Capillary: 106 mg/dL — ABNORMAL HIGH (ref 65–99)
Glucose-Capillary: 148 mg/dL — ABNORMAL HIGH (ref 65–99)

## 2015-06-08 LAB — BASIC METABOLIC PANEL
Anion gap: 12 (ref 5–15)
BUN: 13 mg/dL (ref 6–20)
CHLORIDE: 96 mmol/L — AB (ref 101–111)
CO2: 29 mmol/L (ref 22–32)
CREATININE: 0.88 mg/dL (ref 0.61–1.24)
Calcium: 8 mg/dL — ABNORMAL LOW (ref 8.9–10.3)
GFR calc Af Amer: >60 mL/min (ref >60)
GFR calc non Af Amer: >60 mL/min (ref >60)
Glucose, Bld: 162 mg/dL — ABNORMAL HIGH (ref 65–99)
Potassium: 4.1 mmol/L (ref 3.5–5.1)
Sodium: 137 mmol/L (ref 135–145)

## 2015-06-08 LAB — CK: Total CK: 3474 U/L — ABNORMAL HIGH (ref 49–397)

## 2015-06-08 LAB — TRIGLYCERIDES: Triglycerides: 513 mg/dL — ABNORMAL HIGH (ref <150)

## 2015-06-08 LAB — ENTEROVIRUS PCR: ENTEROVIRUS PCR: NEGATIVE

## 2015-06-08 LAB — MAGNESIUM: Magnesium: 1.7 mg/dL (ref 1.7–2.4)

## 2015-06-08 LAB — PHOSPHORUS: PHOSPHORUS: 2.5 mg/dL (ref 2.5–4.6)

## 2015-06-08 MED ORDER — DEXTROSE 5 % IV SOLN
1.0000 mg/min | INTRAVENOUS | Status: DC
  Filled 2015-06-08: qty 100

## 2015-06-08 MED ORDER — MAGNESIUM SULFATE 2 GM/50ML IV SOLN
INTRAVENOUS | Status: AC
  Filled 2015-06-08: qty 50

## 2015-06-08 MED ORDER — FUROSEMIDE 10 MG/ML IJ SOLN
40.0000 mg | Freq: Four times a day (QID) | INTRAMUSCULAR | Status: AC
  Administered 2015-06-08 – 2015-06-09 (×3): 40 mg via INTRAVENOUS
  Filled 2015-06-08 (×2): qty 4

## 2015-06-08 MED ORDER — FUROSEMIDE 10 MG/ML IJ SOLN
INTRAMUSCULAR | Status: AC
  Filled 2015-06-08: qty 4

## 2015-06-08 MED ORDER — POTASSIUM PHOSPHATES 15 MMOLE/5ML IV SOLN
30.0000 mmol | Freq: Once | INTRAVENOUS | Status: AC
  Administered 2015-06-08: 30 mmol via INTRAVENOUS
  Filled 2015-06-08: qty 10

## 2015-06-08 MED ORDER — MAGNESIUM SULFATE 2 GM/50ML IV SOLN
2.0000 g | Freq: Once | INTRAVENOUS | Status: AC
  Administered 2015-06-08: 2 g via INTRAVENOUS

## 2015-06-08 NOTE — Progress Notes (Signed)
STROKE TEAM PROGRESS NOTE   SUBJECTIVE (INTERVAL HISTORY) His RN is at the bedside.  Pt still intubated and off sedation, not following commands or open eyes. Due to concerning for NMS/malignant hyperthermia, dantrolene was started on Q4h bases. Pt temp, HR, RR significantly improved, muscle stiffness also much improved.    OBJECTIVE Temp:  [98.4 F (36.9 C)-104 F (40 C)] 98.4 F (36.9 C) (03/03 0900) Pulse Rate:  [78-129] 100 (03/03 0900) Cardiac Rhythm:  [-] Normal sinus rhythm (03/03 0800) Resp:  [11-40] 28 (03/03 0900) BP: (92-233)/(47-186) 147/109 mmHg (03/03 0900) SpO2:  [95 %-100 %] 95 % (03/03 0900) FiO2 (%):  [40 %] 40 % (03/03 0814) Weight:  [140 lb 10.5 oz (63.8 kg)] 140 lb 10.5 oz (63.8 kg) (03/03 0430)  CBC:  Recent Labs Lab 06/04/15 1753  06/06/15 0338 06/07/15 0735 06/08/15 0548  WBC 6.2  < > 10.5 10.6* 6.2  NEUTROABS 4.2  --  8.4*  --   --   HGB 15.7  < > 13.7 14.0 12.2*  HCT 44.4  < > 40.7 40.5 34.1*  MCV 90.1  < > 89.5 90.8 89.5  PLT 182  < > 172 129* 110*  < > = values in this interval not displayed.  Basic Metabolic Panel:   Recent Labs Lab 06/07/15 0513 06/08/15 0548  NA 138 137  K 4.1 4.1  CL 109 96*  CO2 18* 29  GLUCOSE 135* 162*  BUN 16 13  CREATININE 1.05 0.88  CALCIUM 8.3* 8.0*  MG 1.7 1.7  PHOS 2.9 2.5    Lipid Panel:     Component Value Date/Time   CHOL 114 06/05/2015 0538   TRIG 513* 06/08/2015 0548   HDL 36* 06/05/2015 0538   CHOLHDL 3.2 06/05/2015 0538   VLDL 15 06/05/2015 0538   LDLCALC 63 06/05/2015 0538   HgbA1c:  Lab Results  Component Value Date   HGBA1C 5.8* 06/05/2015   Urine Drug Screen:     Component Value Date/Time   LABOPIA NONE DETECTED 06/04/2015 1840   COCAINSCRNUR NONE DETECTED 06/04/2015 1840   LABBENZ NONE DETECTED 06/04/2015 1840   AMPHETMU NONE DETECTED 06/04/2015 1840   THCU NONE DETECTED 06/04/2015 1840   LABBARB NONE DETECTED 06/04/2015 1840      IMAGING I have personally reviewed the  radiological images below and agree with the radiology interpretations.  Ct Head Wo Contrast 06/04/2015  1.  No acute intracranial abnormality. 2. Remote bilateral basal ganglia lacunar infarcts. 3. Apparent soft tissue fullness in the nasopharynx could relate to retained secretions, especially given absence of mastoid effusions. Consider physical exam correlation to exclude unlikely soft tissue mass in this area.   Mr Brain Wo Contrast 06/05/2015   Acute LEFT basal ganglia lacunar infarct. Old bilateral basal ganglia and thalamus lacunar infarcts. Old small LEFT cerebellar infarct. Moderate to severe chronic small vessel ischemic disease.   Dg Chest Port 1 View 06/05/2015  1. Lines and tubes stable position. 2. Low lung volumes with mild bibasilar atelectasis  06/04/2015   1. Endotracheal tube tip well-positioned 4 cm above the carina. 2. Clear lungs.  06/04/2015   Endotracheal tube in the right mainstem bronchus. Recommend retraction and repositioning by approximately 5-6 cm. Mild left lung base interstitial markings.  06/04/2015  1. Endotracheal tube tip now projects 5.5 cm above the carina. No other change from the prior study.   CTA head and neck No extracranial or intracranial stenosis of significance to explain the patient's acute LEFT basal  ganglia infarct. Specifically no LEFT MCA dissection or thrombus. Chronic changes as described. Developing cytotoxic edema LEFT basal ganglia and white matter corresponding to restricted diffusion on MR. No hemorrhagic transformation.  2D echo - Normal LV size and systolic function, EF 60-65%. Normal RV size and systolic function. No significant valvular abnormalities. Small circumferential pericardial effusion.  EEG - This is an unremarkable awake and drowsy routine inpatient EEG. Clinical correlation is recommended .   LTM EEG This is an abnormal EEG due to the presence of generalized polymorphic delta slowing, suggesting severe encephalopathy,  but medication effect (e.g., benzodiazepine, propofol) could not be excluded. No epileptiform discharges or seizures were in evidence.   PHYSICAL EXAM  Temp:  [98.4 F (36.9 C)-104 F (40 C)] 98.4 F (36.9 C) (03/03 0900) Pulse Rate:  [78-129] 100 (03/03 0900) Resp:  [11-40] 28 (03/03 0900) BP: (92-233)/(47-186) 147/109 mmHg (03/03 0900) SpO2:  [95 %-100 %] 95 % (03/03 0900) FiO2 (%):  [40 %] 40 % (03/03 0814) Weight:  [140 lb 10.5 oz (63.8 kg)] 140 lb 10.5 oz (63.8 kg) (03/03 0430)  General - thin built, well developed, intubated on vent off sedation.  Ophthalmologic - Fundi not visualized due to ET tube and positioning.  Cardiovascular - Regular rhythm and rate.  Neuro - intubated, off sedation, not open eyes on voice or pain, not following commands. PERRL, doll's eye present, eyes in middle position, positive corneal and gag. On stimulation, LLE withdraws against gravity, but LUE and RLE mild withdraw without extension, RUE minimal movement. Bilateral increased tone UEs and LEs much improved from yesterday. Bilaterally babinski positive, DTR 1+. Sensation, coordination and gait not tested.   ASSESSMENT/PLAN [name] is a 59 y.o. male with history of HTN, ETOH abuse, and prior CVAs found unresponsive and shaking at home. Found to have urinary incontinence, leftward gaze deviation, right sided facial droop, and posturing on the left side. At [facility], loaded with Keppra and intubated. Transferred to [facility] for treatment.  He did not receive IV t-PA.   likely NMS vs. Malignant hyperthermia - on dantrolene Q4h  Intubated, coma  High grade fever not responding to traditional measures - much improved  Elevated CK - trending down  Muscle stiffness throughout - much improved  Tachycardia and tachypenia - much improved  No typical offending medication has been used so far - not sure what pt took before admission  Blood culture NGTD  Sputum culture strep pneumo - on  rocephine  LP done not supportive for CNS infection  CSF culture NGTD  UA negative  CXR not remarkable  EEG and LTM EEG negative for seizure   empiric treat with dantrolene Q4h - clinically much improved  ? Delirium tremor  Heavy alcoholic  Alcohol level undetectable this time  AMS with stuporous needing intubation  Stimulation induced bilateral posturing  Intubated and on FA, B1 and MVit  Supportive care  Seziure unlikely  Hx seizures, status epilepticus  Stimulation induced b/l posturing - much improved after dantrolene  Loaded with Keppra. On 1000 bid - discontinued as no evidence of seizure.  EEG unremarkable  LTM EEG no seizure activity  Seizure precautions.  Acute Respiratory Failure  Intubated  Sedated initially, off propofol now  Sputum culture - strep pneumo - on rocephine  CCM on board  Stroke:  left basal ganglia infarct, but not able to explain presenting symptoms, likely secondary to small vessel disease source   MRI  L BG small infarct. Old bilateral basal ganglia  and thalamic infarcts. Old L cerebellar infarct.  CTA head and neck unremarkable   2D Echo  EF 60-65%   May need to consider TEE to rule out endocarditis if blood culture positive  LDL 63  HgbA1c 5.8  Heparin 5000 units sq tid for VTE prophylaxis Diet NPO time specified  No antithrombotic prior to admission, now on aspirin 300 mg suppository daily.  Ongoing aggressive stroke risk factor management  Therapy recommendations:  pending   Disposition:  pending  (lives with niece)  Hypertension  Stable  Alcohol abuse  Alcohol not detectable  On FA, MVit, and B1  CIWA protocol  Other Stroke Risk Factors  Hx of Crack Cocaine use - UDS negative this admission  Other Active Problems  Elevated CK  Hospital day # 4  This patient is critically ill due to respiratory failure, acute stroke, high grade fever, increased muscle tone and at significant risk of  neurological worsening, death form status epilepticus, heart failure, recurrent stroke, NMS and DT. This patient's care requires constant monitoring of vital signs, hemodynamics, respiratory and cardiac monitoring, review of multiple databases, neurological assessment, discussion with family, other specialists and medical decision making of high complexity. I spent 40 minutes of neurocritical care time in the care of this patient.   Marvel PlanJindong Lenaya Pietsch, MD PhD Stroke Neurology 06/08/2015 9:42 AM    To contact Stroke Continuity provider, please refer to [email or web address]. After hours, contact General Neurology

## 2015-06-08 NOTE — Procedures (Signed)
Continuous Video-EEG Monitoring Report   Patient: Gregory Parker, [name]     EEG No.ID: [id number]     DOB: May 15, 1955 Age: [age] Room#31M   MED REC NO: [id number] Gender: Male   TECH: Amy Zwelling     Physician: Ronnie DossYuan Kaydan Wong     Referring Physician: Albertine GratesFang Xu   Report Date: 06/08/2015     Study Duration:06/07/2015 07:30 to 06/08/2015 07:30 CPT [id number]:[id number] Diagnosis:Altered mental status (R41.82)  History: This is a 60 year old male presenting with altered mental status with body shaking. Video-EEG monitoring was performed to evaluate for seizures.  Technical Details: Long-term video-EEG monitoring was performed using standard setting per the guidelines. Briefly, a minimum of 21 electrodes were placed on scalp according to the International 10-20 or/and 10-10 Systems. Supplemental electrodes were placed as needed. Single EKG electrode was also used to detect cardiac arrhythmia. Patient's behavior was continuously recorded on video simultaneously with EEG. A minimum of 16 channels were used for data display. Each epoch of study was reviewed manually daily and as needed using standard referential and bipolar montages.   EEG Description: There was generalized polymorphic delta slowing. Diffuse beta activity was present, likely due to medication effect (e.g., benzodiazepine, propofol). No posterior dominant rhythm or sleep architecture was recorded. Hyperventilation and photic stimulation was not performed. No epileptiform discharges or seizures were in evidence. Intermittent limb posturing and shaking (mostly the left leg) was captured, which had no EEG correlate.  Impression: This is an abnormal EEG due to the presence of generalized polymorphic delta slowing, suggesting severe encephalopathy, but medication effect (e.g., benzodiazepine, propofol) could not be excluded. No epileptiform discharges or seizures were in  evidence.Compared with the last study, current study shows no significant changes.     Reading Physician: Ronnie DossYuan Dewight Catino, MD, PhD

## 2015-06-08 NOTE — Progress Notes (Signed)
PULMONARY / CRITICAL CARE MEDICINE   Name: Gregory Parker MRN: [id number] DOB: 10/27/1955    ADMISSION DATE:  06/04/2015 CONSULTATION DATE:  06/04/2015  REFERRING MD:  [facility]  CHIEF COMPLAINT:  AMS  HISTORY OF PRESENT ILLNESS:  Patient is encephalopathic, therefore; HPI obtained from chart review. 60 year old male with PMH as below, which is significant for HTN, ETOH, and CVA. 2/27 EMS was dispatched to the patient's home due to reports of shaking episodes with decreased responsiveness. He has a history of ETOH abuse and family reports that last drink was today, and he has only had one drink today. Family also reports that he may have smoked crack over the weekend. He was last seen normal 430 PM. In the emergency department, staff noted urinary incontinence, leftward gaze deviation, R facial droop, and posturing on the L side. He then proceeded to have seizure like activity with limited airway protection, and thus was intubated by EDP. He had a CT of the head which showed, no acute abnormality, but remote basal ganglia infarcts. He was loaded with 1G IV Keppra and started on propofol for sedation/seizure management. [facility].    SUBJECTIVE:  Off sedation, not responsive but HR and BP are elevated off sedation.  VITAL SIGNS: BP 147/109 mmHg  Pulse 100  Temp(Src) 98.4 F (36.9 C) (Core (Comment))  Resp 28  Ht 5\' 4"  (1.626 m)  Wt 63.8 kg (140 lb 10.5 oz)  BMI 24.13 kg/m2  SpO2 95%  HEMODYNAMICS:    VENTILATOR SETTINGS: Vent Mode:  [-] PRVC FiO2 (%):  [40 %] 40 % Set Rate:  [14 bmp] 14 bmp Vt Set:  [500 mL] 500 mL PEEP:  [5 cmH20] 5 cmH20 Plateau Pressure:  [21 cmH20-25 cmH20] 25 cmH20  INTAKE / OUTPUT: I/O last 3 completed shifts: In: 6951.9 [I.V.:3147.2; NG/GT:2040; IV Piggyback:1764.7] Out: 2000 [Urine:2000]  PHYSICAL EXAMINATION: General: AA male, in NAD. Neuro: Sedated, grimaces with  pain. HEENT: [place]/AT. PERRL, sclerae anicteric. Cardiovascular: Tachy, regular, no M/R/G.  Lungs: Respirations even and unlabored.  Coarse bilaterally. Abdomen: BS x 4, soft, NT/ND.  Musculoskeletal: No gross deformities, no edema.  Skin: Intact, warm, no rashes.  BMET  Recent Labs Lab 06/06/15 0338 06/07/15 0513 06/08/15 0548  NA 139 138 137  K 3.5 4.1 4.1  CL 108 109 96*  CO2 20* 18* 29  BUN 10 16 13   CREATININE 1.02 1.05 0.88  GLUCOSE 117* 135* 162*   Electrolytes  Recent Labs Lab 06/06/15 0338 06/07/15 0513 06/08/15 0548  CALCIUM 9.1 8.3* 8.0*  MG 2.0 1.7 1.7  PHOS 2.8 2.9 2.5   CBC  Recent Labs Lab 06/06/15 0338 06/07/15 0735 06/08/15 0548  WBC 10.5 10.6* 6.2  HGB 13.7 14.0 12.2*  HCT 40.7 40.5 34.1*  PLT 172 129* 110*   Coag's  Recent Labs Lab 06/04/15 1753  INR 0.97   Sepsis Markers  Recent Labs Lab 06/04/15 1800 06/04/15 2308 06/05/15 0414 06/06/15 0338 06/07/15 0513  LATICACIDVEN 1.24 1.3  --   --   --   PROCALCITON  --   --  0.21 0.30 1.99   ABG  Recent Labs Lab 06/06/15 0333 06/07/15 0312 06/08/15 0437  PHART 7.403 7.393 7.501*  PCO2ART 32.5* 34.2* 35.9  PO2ART 86.0 118.0* 87.1   Liver Enzymes  Recent Labs Lab 06/04/15 1753  AST 27  ALT 22  ALKPHOS 75  BILITOT 0.7  ALBUMIN 4.6  Cardiac Enzymes  Recent Labs Lab 06/04/15 2308  TROPONINI 0.04*   Glucose  Recent Labs Lab 06/07/15 1156 06/07/15 1526 06/07/15 2044 06/07/15 2328 06/08/15 0329 06/08/15 0818  GLUCAP 132* 106* 116* 106* 117* 147*   Imaging Dg Chest Port 1 View  06/08/2015  CLINICAL DATA:  Intubated, stroke EXAM: PORTABLE CHEST 1 VIEW COMPARISON:  06/07/2015 FINDINGS: Cardiomediastinal silhouette is unremarkable. Stable central mild vascular congestion and mild perihilar infrahilar interstitial prominence suspicious for residual mild interstitial edema. Endotracheal and NG tube are unchanged in position. Mild basilar atelectasis. No segmental  infiltrate. IMPRESSION: Stable endotracheal and NG tube position. Persistent central mild vascular congestion and residual mild interstitial edema. Mild basilar atelectasis. No segmental infiltrate. Electronically Signed   By: Natasha Mead M.D.   On: 06/08/2015 07:58   STUDIES:  CT head 2/27 >  No acute intracranial abnormality. Remote bilateral basal ganglia lacunar infarcts. Apparent soft tissue fullness in the nasopharynx could relate to retained secretions, especially given absence of mastoid effusions.   CULTURES: BCx2 2/27 >  ANTIBIOTICS: None.  SIGNIFICANT EVENTS: 2/27 AMS, ? Seizure, intubated in [facility] ED, tx to [facility] ICU  LINES/TUBES: ETT 2/27>>>  ASSESSMENT / PLAN:  NEUROLOGIC A:   Seizures with concern for status epilepticus. Hx ETOH and polysubstance abuse (crack cocaine). P:   D/C propofol. PRN fentanyl. RASS goal: 0 to -1. Keppra incresed by Neuro ContinueThiamine / Folate. MRI per neurology noted. Further recommendations per neurology. LP negative for infection, will d/c all abx. D/C bacterial and viral meningitis coverage. ?malignant hyperthermia. Continue dantrolene for 48 hours. Thiamine/folate/MVI.  PULMONARY A: Acute hypoxemic respiratory failure secondary to seizures. P:   PS trials as able. No extubation given mental status. Trend ABG. VAP prevention measures. CXR in AM.  CARDIOVASCULAR A:  H/o HTN. P:  Monitor hemodynamics Trend Troponin PRN Hydralazine for SBP>160. Labetalol drip with holding parameters.  RENAL A:   CPK >5000. P:   Alkalinize urine. D/C NS. Trend BMP Replace electrolytes as indicated.  GASTROINTESTINAL A:   ETOH abuse. GI prophylaxis. Nutrition. P:   Continue protonix. Consult nutrition for TF as per nutrition.  HEMATOLOGIC A:   VTE prophylaxis. P:  Continue SCD's/heparin. Trend CBC  INFECTIOUS A:   Concern for meningitis  P:   LP 3/1>>>negative Vancomycin 3/1>>>3/2 Rocephin  3/1>>>3/2>>>3/2>>> Amp 3/1>>>3/2 Acycolovir 3/1>>>3/2  Sputum with pneumococcus.  Rocephin restarted 3/2.  ENDOCRINE A:   No acute issues.   P:   No interventions required.  No family bedside to update.  Will treat infection and presumed malignant hyperthermia.  D/C sedation and evaluate mental status.  The patient is critically ill with multiple organ systems failure and requires high complexity decision making for assessment and support, frequent evaluation and titration of therapies, application of advanced monitoring technologies and extensive interpretation of multiple databases.   Critical Care Time devoted to patient care services described in this note is  35  Minutes. This time reflects time of care of this signee Dr Koren Bound. This critical care time does not reflect procedure time, or teaching time or supervisory time of PA/NP/Med student/Med Resident etc but could involve care discussion time.  Alyson Reedy, M.D. [facility] Pulmonary/Critical Care Medicine. Pager: [phone]. After hours pager: [phone].

## 2015-06-08 NOTE — Progress Notes (Signed)
EEG taken down, no skin breakdown seen.

## 2015-06-08 NOTE — Care Management Note (Signed)
Case Management Note  Patient Details  Name: Gregory Parker MRN: [id number] Date of Birth: [birth year]  Subjective/Objective:   Pt admitted on 06/03/25 with seizures, respiratory failure.  PTA, pt independent; current substance and ETOH abuse.                   Action/Plan: Pt currently remains on ventilator.  Will follow for discharge planning as pt progresses.    Expected Discharge Date:                  Expected Discharge Plan:     In-House Referral:  Clinical Social Work  Discharge planning Services  CM Consult  Post Acute Care Choice:    Choice offered to:     DME Arranged:    DME Agency:     HH Arranged:    HH Agency:     Status of Service:  In process, will continue to follow  Medicare Important Message Given:    Date Medicare IM Given:    Medicare IM give by:    Date Additional Medicare IM Given:    Additional Medicare Important Message give by:     If discussed at Long Length of Stay Meetings, dates discussed:    Additional Comments:  Quintella BatonJulie W. Kris No, RN, BSN  Trauma/Neuro ICU Case Manager [phone]

## 2015-06-08 NOTE — Progress Notes (Signed)
SLP Cancellation Note  Patient Details Name: Carlisle CaterClay Berlinger MRN: [id number] DOB: 1956-02-08   Cancelled treatment:       Reason Eval/Treat Not Completed: Medical issues which prohibited therapy. Will sign off at this time. Please reorder when appropriate   Ion Gonnella, Riley NearingBonnie Caroline 06/08/2015, 7:24 AM

## 2015-06-09 ENCOUNTER — Inpatient Hospital Stay (HOSPITAL_COMMUNITY): Payer: Medicare Other

## 2015-06-09 DIAGNOSIS — F10230 Alcohol dependence with withdrawal, uncomplicated: Secondary | ICD-10-CM

## 2015-06-09 DIAGNOSIS — I639 Cerebral infarction, unspecified: Secondary | ICD-10-CM

## 2015-06-09 DIAGNOSIS — R4020 Unspecified coma: Secondary | ICD-10-CM

## 2015-06-09 DIAGNOSIS — J9600 Acute respiratory failure, unspecified whether with hypoxia or hypercapnia: Secondary | ICD-10-CM

## 2015-06-09 LAB — BLOOD GAS, ARTERIAL
ACID-BASE EXCESS: 8.3 mmol/L — AB (ref 0.0–2.0)
Bicarbonate: 31.8 mEq/L — ABNORMAL HIGH (ref 20.0–24.0)
DRAWN BY: 244851
FIO2: 0.4
MECHVT: 500 mL
O2 Saturation: 98.7 %
PEEP/CPAP: 5 cmH2O
PO2 ART: 137 mmHg — AB (ref 80.0–100.0)
Patient temperature: 98.6
RATE: 14 resp/min
TCO2: 33 mmol/L (ref 0–100)
pCO2 arterial: 40.1 mmHg (ref 35.0–45.0)
pH, Arterial: 7.51 — ABNORMAL HIGH (ref 7.350–7.450)

## 2015-06-09 LAB — CULTURE, BLOOD (ROUTINE X 2)
Culture: NO GROWTH
Culture: NO GROWTH

## 2015-06-09 LAB — CBC
HCT: 34.2 % — ABNORMAL LOW (ref 39.0–52.0)
Hemoglobin: 11.8 g/dL — ABNORMAL LOW (ref 13.0–17.0)
MCH: 30.4 pg (ref 26.0–34.0)
MCHC: 34.5 g/dL (ref 30.0–36.0)
MCV: 88.1 fL (ref 78.0–100.0)
PLATELETS: 132 10*3/uL — AB (ref 150–400)
RBC: 3.88 MIL/uL — ABNORMAL LOW (ref 4.22–5.81)
RDW: 13.7 % (ref 11.5–15.5)
WBC: 4.8 10*3/uL (ref 4.0–10.5)

## 2015-06-09 LAB — BASIC METABOLIC PANEL
Anion gap: 13 (ref 5–15)
BUN: 12 mg/dL (ref 6–20)
CALCIUM: 8.3 mg/dL — AB (ref 8.9–10.3)
CO2: 29 mmol/L (ref 22–32)
CREATININE: 1.01 mg/dL (ref 0.61–1.24)
Chloride: 94 mmol/L — ABNORMAL LOW (ref 101–111)
Glucose, Bld: 178 mg/dL — ABNORMAL HIGH (ref 65–99)
Potassium: 3.3 mmol/L — ABNORMAL LOW (ref 3.5–5.1)
SODIUM: 136 mmol/L (ref 135–145)

## 2015-06-09 LAB — MAGNESIUM: MAGNESIUM: 1.8 mg/dL (ref 1.7–2.4)

## 2015-06-09 LAB — CSF CULTURE W GRAM STAIN
Culture: NO GROWTH
Special Requests: NORMAL

## 2015-06-09 LAB — GLUCOSE, CAPILLARY
GLUCOSE-CAPILLARY: 160 mg/dL — AB (ref 65–99)
GLUCOSE-CAPILLARY: 134 mg/dL — AB (ref 65–99)
Glucose-Capillary: 161 mg/dL — ABNORMAL HIGH (ref 65–99)
Glucose-Capillary: 135 mg/dL — ABNORMAL HIGH (ref 65–99)

## 2015-06-09 LAB — CK: Total CK: 2543 U/L — ABNORMAL HIGH (ref 49–397)

## 2015-06-09 LAB — PHOSPHORUS: PHOSPHORUS: 4.2 mg/dL (ref 2.5–4.6)

## 2015-06-09 LAB — CSF CULTURE

## 2015-06-09 MED ORDER — FENTANYL CITRATE (PF) 100 MCG/2ML IJ SOLN
50.0000 ug | INTRAMUSCULAR | Status: DC | PRN
  Administered 2015-06-09 – 2015-06-12 (×11): 50 ug via INTRAVENOUS
  Filled 2015-06-09 (×12): qty 2

## 2015-06-09 MED ORDER — POTASSIUM CHLORIDE 20 MEQ/15ML (10%) PO SOLN
20.0000 meq | ORAL | Status: AC
  Administered 2015-06-09 (×2): 20 meq
  Filled 2015-06-09 (×2): qty 15

## 2015-06-09 MED ORDER — MAGNESIUM SULFATE 2 GM/50ML IV SOLN
2.0000 g | Freq: Once | INTRAVENOUS | Status: AC
  Administered 2015-06-09: 2 g via INTRAVENOUS
  Filled 2015-06-09: qty 50

## 2015-06-09 NOTE — Progress Notes (Signed)
PULMONARY / CRITICAL CARE MEDICINE   Name: Gregory Parker MRN: [id number] DOB: 1955/10/14    ADMISSION DATE:  06/04/2015 CONSULTATION DATE:  06/04/2015  REFERRING MD:  EDP at [place]:  AMS  HISTORY OF PRESENT ILLNESS:  Patient is encephalopathic, therefore; HPI obtained from chart review. 60 year old male with PMH as below, which is significant for HTN, ETOH, and CVA. 2/27 EMS was dispatched to the patient's home due to reports of shaking episodes with decreased responsiveness. He has a history of ETOH abuse and family reports that last drink was today, and he has only had one drink today. Family also reports that he may have smoked crack over the weekend. He was last seen normal 430 PM. In the emergency department, staff noted urinary incontinence, leftward gaze deviation, R facial droop, and posturing on the L side. He then proceeded to have seizure like activity with limited airway protection, and thus was intubated by EDP. He had a CT of the head which showed, no acute abnormality, but remote basal ganglia infarcts. He was loaded with 1G IV Keppra and started on propofol for sedation/seizure management. He was transferred to [facility] for ICU admission and neurology evaluation.    SUBJECTIVE: remains vent, no change in MS  VITAL SIGNS: BP 122/74 mmHg  Pulse 97  Temp(Src) 98.8 F (37.1 C) (Core (Comment))  Resp 32  Ht '5\' 4"'$  (1.626 m)  Wt 59.4 kg (130 lb 15.3 oz)  BMI 22.47 kg/m2  SpO2 100%  HEMODYNAMICS:    VENTILATOR SETTINGS: Vent Mode:  [-] PSV;CPAP FiO2 (%):  [40 %] 40 % Set Rate:  [14 bmp] 14 bmp Vt Set:  [500 mL] 500 mL PEEP:  [5 cmH20] 5 cmH20 Pressure Support:  [8 cmH20] 8 cmH20 Plateau Pressure:  [19 cmH20-22 cmH20] 21 cmH20  INTAKE / OUTPUT: I/O last 3 completed shifts: In: 6222.2 [I.V.:1576.5; Other:180; NG/GT:2040; IV Piggyback:2425.7] Out: 7564 [Urine:7250]  PHYSICAL EXAMINATION: General: AA male, in NAD. Neuro: Sedated, rass -3 HEENT:  [place]/AT. PERRL, sclerae anicteric. Cardiovascular: s1 s2 RRT  Lungs: ronchi Coarse bilaterally. Abdomen: BS x 4, soft, NT/ND.  Musculoskeletal: No gross deformities, no edema.  Skin: Intact, warm, no rashes.  BMET  Recent Labs Lab 06/07/15 0513 06/08/15 0548 06/09/15 0307  NA 138 137 136  K 4.1 4.1 3.3*  CL 109 96* 94*  CO2 18* 29 29  BUN '16 13 12  '$ CREATININE 1.05 0.88 1.01  GLUCOSE 135* 162* 178*   Electrolytes  Recent Labs Lab 06/07/15 0513 06/08/15 0548 06/09/15 0307  CALCIUM 8.3* 8.0* 8.3*  MG 1.7 1.7 1.8  PHOS 2.9 2.5 4.2   CBC  Recent Labs Lab 06/07/15 0735 06/08/15 0548 06/09/15 0307  WBC 10.6* 6.2 4.8  HGB 14.0 12.2* 11.8*  HCT 40.5 34.1* 34.2*  PLT 129* 110* 132*   Coag's  Recent Labs Lab 06/04/15 1753  INR 0.97   Sepsis Markers  Recent Labs Lab 06/04/15 1800 06/04/15 2308 06/05/15 0414 06/06/15 0338 06/07/15 0513  LATICACIDVEN 1.24 1.3  --   --   --   PROCALCITON  --   --  0.21 0.30 1.99   ABG  Recent Labs Lab 06/07/15 0312 06/08/15 0437 06/09/15 0500  PHART 7.393 7.501* 7.510*  PCO2ART 34.2* 35.9 40.1  PO2ART 118.0* 87.1 137*   Liver Enzymes  Recent Labs Lab 06/04/15 1753  AST 27  ALT 22  ALKPHOS 75  BILITOT 0.7  ALBUMIN 4.6   Cardiac Enzymes  Recent  Labs Lab 06/04/15 2308  TROPONINI 0.04*   Glucose  Recent Labs Lab 06/08/15 1146 06/08/15 1602 06/08/15 2005 06/08/15 2313 06/09/15 0351 06/09/15 0723  GLUCAP 131* 147* 144* 148* 160* 161*   Imaging Dg Chest Port 1 View  06/09/2015  CLINICAL DATA:  Intubation.  Stroke. EXAM: PORTABLE CHEST 1 VIEW COMPARISON:  June 08, 2015 FINDINGS: The ET and NG tubes are again seen in good position. No pneumothorax. Mild opacity remains in the medial right lung base. No other interval changes or acute abnormalities. IMPRESSION: No interval change. Mild persistent opacity in the medial right lung base. Support apparatus in good position. Electronically Signed   By: Dorise Bullion III M.D   On: 06/09/2015 08:19   STUDIES:  CT head 2/27 >  No acute intracranial abnormality. Remote bilateral basal ganglia lacunar infarcts. Apparent soft tissue fullness in the nasopharynx could relate to retained secretions, especially given absence of mastoid effusions.  MRI brain 3/3>>>acute left basal ganglia lacunar  CULTURES: BCx2 2/27 > 3/2>>> strep PNA  ANTIBIOTICS:  SIGNIFICANT EVENTS: 2/27 AMS, ? Seizure, intubated in [facility] ED, tx to [facility] ICU  LINES/TUBES: ETT 2/27>>>  ASSESSMENT / PLAN:  NEUROLOGIC A:   Seizures with concern for status epilepticus. Hx ETOH and polysubstance abuse (crack cocaine). P:   PRN fentanyl. RASS goal: 0 to -1. Keppra incresed by Neuro ContinueThiamine / Folate. Further recommendations per neurology. Continue dantrolene per neuro, likely can dc with temp curve trend Thiamine/folate/MVI.  PULMONARY A: Acute hypoxemic respiratory failure secondary to seizures, resp alk Concern PNA P:   PS 8 required  Consider reduction MV ,. TV by 1 cckg pcxr in am   CARDIOVASCULAR A:  H/o HTN. coc use P:  Monitor hemodynamics Trend Troponin PRN Hydralazine for SBP>160. Labetalol drip aovi as able with cocaine Used nicardipine if needed  RENAL A:   CPK >5000. hypok P:   No role bicarb Trend BMP k supp Mag Hold lasix  GASTROINTESTINAL A:   ETOH abuse. GI prophylaxis. Nutrition. P:   Continue protonix. Consult nutrition for TF as per nutrition.  HEMATOLOGIC A:   VTE prophylaxis. P:  Continue SCD's/heparin. Trend CBC  INFECTIOUS A:   Concern for meningitis  Concern PNA P:   LP 3/1>>>negative Vancomycin 3/1>>>3/2 Rocephin 3/1>>>3/2>>>3/2>>> Amp 3/1>>>3/2 Acycolovir 3/1>>>3/2  Sputum with pneumococcus.  Rocephin restarted 3/2. Treat 8 days  ENDOCRINE A:   No acute issues.   P:   No interventions required.  Ccm time 30 min  Lavon Paganini. Titus Mould, MD, [place] Pgr: [place] [facility]  [facility]

## 2015-06-09 NOTE — Progress Notes (Signed)
[facility] FOR AM LAB REPLACEMENT ONLY  The patient does [facility] Protocol based on the criteria listed below:   1. Is GFR >/= 40 ml/min? Yes.    Patient's GFR today is >60 2. Is urine output >/= 0.5 ml/kg/hr for the last 6 hours? Yes.   Patient's UOP is 2.48 ml/kg/hr 3. Is BUN < 60 mg/dL? Yes.    Patient's BUN today is 12 4. Abnormal electrolyte  K 3.3, Mg 1.8 5. Ordered repletion with: per protocol 6. If a panic level lab has been reported, has the CCM MD in charge been notified? Yes.  .   Physician:  E Deterding  Ardelle ParkSuits, Toby Ayad McEachran 06/09/2015 4:30 AM

## 2015-06-09 NOTE — Progress Notes (Signed)
STROKE TEAM PROGRESS NOTE  History at time of admission 60 y.o. male I am consulted to evaluate for unresponsiveness. History is obtained through chart review as patient is intubated and on propofol. The patient has a history of HTN, ETOH abuse, and prior CVAs. 2/27 EMS was summoned to the patient's home due to him being found unresponsive and shaking episodes by family around 5pm. He was last seen normal at 4:30pm. He has a history of ETOH abuse and last drink was today. Blood alcohol level was undetectable. Per family, he may have smoked crack cocaine in the past few days but his UTOX screen was negative for any drugs of abuse.   In the [facility] emergency department, staff noted urinary incontinence, leftward gaze deviation, right sided facial droop, and posturing on the left side. He then proceeded to have seizure like activity with limited airway protection, and thus was intubated. He had a CT of the head which showed, no acute abnormality, but chronic bilateral infarcts. He was loaded with 1Gram IV Keppra and started on propofol for sedation/seizure management. [facility] and neurology evaluation.   Patient has subsequently developed a fever and continues to posture.     SUBJECTIVE (INTERVAL HISTORY) His RN is at the bedside.  Pt still intubated and off sedation, not following commands or open eyes. Due to concerning for NMS/malignant hyperthermia, dantrolene was started on Q4h bases. Pt temp, HR, RR significantly improved, muscle stiffness also much improved.     OBJECTIVE Temp:  [98.6 F (37 C)-100.9 F (38.3 C)] 99.3 F (37.4 C) (03/04 0700) Pulse Rate:  [85-119] 86 (03/04 0747) Cardiac Rhythm:  [-] Normal sinus rhythm (03/03 2000) Resp:  [14-32] 14 (03/04 0747) BP: (102-163)/(66-97) 102/66 mmHg (03/04 0747) SpO2:  [100 %] 100 % (03/04 0751) FiO2 (%):  [40 %] 40 % (03/04 0751) Weight:  [59.4 kg (130 lb 15.3 oz)] 59.4 kg (130 lb 15.3 oz)  (03/04 0600)  CBC:  Recent Labs Lab 06/04/15 1753  06/06/15 0338  06/08/15 0548 06/09/15 0307  WBC 6.2  < > 10.5  < > 6.2 4.8  NEUTROABS 4.2  --  8.4*  --   --   --   HGB 15.7  < > 13.7  < > 12.2* 11.8*  HCT 44.4  < > 40.7  < > 34.1* 34.2*  MCV 90.1  < > 89.5  < > 89.5 88.1  PLT 182  < > 172  < > 110* 132*  < > = values in this interval not displayed.  Basic Metabolic Panel:   Recent Labs Lab 06/08/15 0548 06/09/15 0307  NA 137 136  K 4.1 3.3*  CL 96* 94*  CO2 29 29  GLUCOSE 162* 178*  BUN 13 12  CREATININE 0.88 1.01  CALCIUM 8.0* 8.3*  MG 1.7 1.8  PHOS 2.5 4.2    Lipid Panel:     Component Value Date/Time   CHOL 114 06/05/2015 0538   TRIG 513* 06/08/2015 0548   HDL 36* 06/05/2015 0538   CHOLHDL 3.2 06/05/2015 0538   VLDL 15 06/05/2015 0538   LDLCALC 63 06/05/2015 0538   HgbA1c:  Lab Results  Component Value Date   HGBA1C 5.8* 06/05/2015   Urine Drug Screen:     Component Value Date/Time   LABOPIA NONE DETECTED 06/04/2015 1840   COCAINSCRNUR NONE DETECTED 06/04/2015 1840   LABBENZ NONE DETECTED 06/04/2015 1840   AMPHETMU NONE DETECTED 06/04/2015 1840  THCU NONE DETECTED 06/04/2015 1840   LABBARB NONE DETECTED 06/04/2015 1840      IMAGING I have personally reviewed the radiological images below and agree with the radiology interpretations.  Ct Head Wo Contrast 06/04/2015   1.  No acute intracranial abnormality.  2. Remote bilateral basal ganglia lacunar infarcts.  3. Apparent soft tissue fullness in the nasopharynx could relate to retained secretions, especially given absence of mastoid effusions. Consider physical exam correlation to exclude unlikely soft tissue mass in this area.    Mr Brain Wo Contrast 06/05/2015    Acute LEFT basal ganglia lacunar infarct.  Old bilateral basal ganglia and thalamus lacunar infarcts.  Old small LEFT cerebellar infarct. Moderate to severe chronic small vessel ischemic disease.    Dg Chest Port 1  View 06/05/2015  1. Lines and tubes stable position. 2. Low lung volumes with mild bibasilar atelectasis  06/04/2015   1. Endotracheal tube tip well-positioned 4 cm above the carina. 2. Clear lungs.  06/04/2015   Endotracheal tube in the right mainstem bronchus. Recommend retraction and repositioning by approximately 5-6 cm. Mild left lung base interstitial markings.  06/04/2015  1. Endotracheal tube tip now projects 5.5 cm above the carina. No other change from the prior study.    CTA head and neck No extracranial or intracranial stenosis of significance to explain the patient's acute LEFT basal ganglia infarct. Specifically no LEFT MCA dissection or thrombus. Chronic changes as described. Developing cytotoxic edema LEFT basal ganglia and white matter corresponding to restricted diffusion on MR. No hemorrhagic transformation.   2D echo - Normal LV size and systolic function, EF 60-65%. Normal RV size and systolic function. No significant valvular abnormalities. Small circumferential pericardial effusion.   EEG - This is an unremarkable awake and drowsy routine inpatient EEG. Clinical correlation is recommended .    LTM EEG This is an abnormal EEG due to the presence of generalized polymorphic delta slowing, suggesting severe encephalopathy, but medication effect (e.g., benzodiazepine, propofol) could not be excluded. No epileptiform discharges or seizures were in evidence.   PHYSICAL EXAM  Temp:  [98.6 F (37 C)-100.9 F (38.3 C)] 99.3 F (37.4 C) (03/04 0700) Pulse Rate:  [85-119] 86 (03/04 0747) Resp:  [14-32] 14 (03/04 0747) BP: (102-163)/(66-97) 102/66 mmHg (03/04 0747) SpO2:  [100 %] 100 % (03/04 0751) FiO2 (%):  [40 %] 40 % (03/04 0751) Weight:  [59.4 kg (130 lb 15.3 oz)] 59.4 kg (130 lb 15.3 oz) (03/04 0600)  Neurologic exam is stable today. Her nurse patient is moving his left upper extremity purposefully and localizing however I did not see that on my  exam.  General - thin built, well developed, intubated on vent off sedation.  Ophthalmologic - Fundi not visualized due to ET tube and positioning.  Cardiovascular - Regular rhythm and rate.  Neuro - intubated, off sedation, not open eyes on voice or pain, not following commands. PERRL, doll's eye present, eyes in middle position, positive corneal and gag. On stimulation, LLE withdraws against gravity, but LUE and RLE mild withdraw without extension, RUE minimal movement. Bilateral increased tone UEs and LEs much improved. Bilaterally babinski positive, DTR 1+. Sensation, coordination and gait not tested.   ASSESSMENT/PLAN Mr. Shady Bradish is a 60 y.o. male with history of HTN, ETOH abuse, and prior CVAs found unresponsive and shaking at home. Found to have urinary incontinence, leftward gaze deviation, right sided facial droop, and posturing on the left side. At AP, loaded with Keppra and intubated. Transferred  to [facility] for treatment.  He did not receive IV t-PA.   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 Check a CK.  CK has been trending down, now afebrile. Last does scheduled for 8am tomorrow morning. Will discontinue at that time, spoke to pharmacy at [phone].   ? Delirium tremor  Heavy alcoholic  Alcohol level undetectable this time  AMS with stuporous needing intubation  Stimulation induced  bilateral posturing  Intubated and on FA, B1 and MVit  Supportive care  Seziure unlikely  Hx seizures, status epilepticus  Stimulation induced b/l posturing - much improved after dantrolene  Loaded with Keppra. On 1000 bid - discontinued as no evidence of seizure.  EEG unremarkable  LTM EEG no seizure activity  Seizure precautions.  Acute Respiratory Failure  Intubated  Sedated initially, off propofol now  Sputum culture - strep pneumo - on rocephine  CCM on board  Stroke:  left basal ganglia infarct, but not able to explain presenting symptoms, likely secondary to small vessel disease source   MRI  L BG small infarct. Old bilateral basal ganglia and thalamic infarcts. Old L cerebellar infarct.  CTA head and neck unremarkable   2D Echo  EF 60-65%   May need to consider TEE to rule out endocarditis if blood culture positive  Blood cultures - no growth 3 days  Tracheal aspirate culture - moderate Streptococcus pneumoniae - day #3 IV Rocephin  LDL 63  HgbA1c 5.8  Heparin 5000 units sq tid for VTE prophylaxis Diet NPO time specified  No antithrombotic prior to admission, now on aspirin 300 mg suppository daily.  Ongoing aggressive stroke risk factor management  Therapy recommendations:  pending   Disposition:  pending  (lives with niece)  Hypertension  Stable  Alcohol abuse  Alcohol not detectable  On FA, MVit, and B1  CIWA protocol  Other Stroke Risk Factors  Hx of Crack Cocaine use - UDS negative this admission  Other Active Problems  Elevated CK  Hospital day # 5  This patient is critically ill due to respiratory failure, acute stroke, high grade fever, increased muscle tone and at significant risk of neurological worsening, death form status epilepticus, heart failure, recurrent stroke, NMS and DT. This patient's care requires constant monitoring of vital signs, hemodynamics, respiratory and cardiac monitoring, review of multiple  databases, neurological assessment, discussion with family, other specialists and medical decision making of high complexity. I spent 40 minutes of neurocritical care time in the care of this patient.     To contact Stroke Continuity provider, please refer to [email or web address]. After hours, contact General Neurology

## 2015-06-10 ENCOUNTER — Inpatient Hospital Stay (HOSPITAL_COMMUNITY): Payer: Medicare Other

## 2015-06-10 DIAGNOSIS — G9340 Encephalopathy, unspecified: Secondary | ICD-10-CM | POA: Diagnosis present

## 2015-06-10 DIAGNOSIS — J189 Pneumonia, unspecified organism: Secondary | ICD-10-CM | POA: Diagnosis present

## 2015-06-10 DIAGNOSIS — J9602 Acute respiratory failure with hypercapnia: Secondary | ICD-10-CM

## 2015-06-10 LAB — CBC WITH DIFFERENTIAL/PLATELET
BASOS ABS: 0 10*3/uL (ref 0.0–0.1)
Basophils Relative: 0 %
EOS PCT: 2 %
Eosinophils Absolute: 0.2 10*3/uL (ref 0.0–0.7)
HEMATOCRIT: 33.4 % — AB (ref 39.0–52.0)
Hemoglobin: 11.2 g/dL — ABNORMAL LOW (ref 13.0–17.0)
LYMPHS ABS: 0.8 10*3/uL (ref 0.7–4.0)
LYMPHS PCT: 10 %
MCH: 30.4 pg (ref 26.0–34.0)
MCHC: 33.5 g/dL (ref 30.0–36.0)
MCV: 90.5 fL (ref 78.0–100.0)
MONO ABS: 0.7 10*3/uL (ref 0.1–1.0)
MONOS PCT: 10 %
NEUTROS ABS: 5.8 10*3/uL (ref 1.7–7.7)
Neutrophils Relative %: 78 %
Platelets: 196 10*3/uL (ref 150–400)
RBC: 3.69 MIL/uL — ABNORMAL LOW (ref 4.22–5.81)
RDW: 14.3 % (ref 11.5–15.5)
WBC: 7.5 10*3/uL (ref 4.0–10.5)

## 2015-06-10 LAB — GLUCOSE, CAPILLARY
GLUCOSE-CAPILLARY: 162 mg/dL — AB (ref 65–99)
Glucose-Capillary: 164 mg/dL — ABNORMAL HIGH (ref 65–99)
Glucose-Capillary: 186 mg/dL — ABNORMAL HIGH (ref 65–99)
Glucose-Capillary: 151 mg/dL — ABNORMAL HIGH (ref 65–99)
Glucose-Capillary: 157 mg/dL — ABNORMAL HIGH (ref 65–99)

## 2015-06-10 LAB — COMPREHENSIVE METABOLIC PANEL
ALBUMIN: 2.6 g/dL — AB (ref 3.5–5.0)
ALT: 82 U/L — ABNORMAL HIGH (ref 17–63)
ANION GAP: 12 (ref 5–15)
AST: 140 U/L — AB (ref 15–41)
Alkaline Phosphatase: 82 U/L (ref 38–126)
BUN: 14 mg/dL (ref 6–20)
CHLORIDE: 98 mmol/L — AB (ref 101–111)
CO2: 25 mmol/L (ref 22–32)
Calcium: 8.7 mg/dL — ABNORMAL LOW (ref 8.9–10.3)
Creatinine, Ser: 0.79 mg/dL (ref 0.61–1.24)
GFR calc Af Amer: >60 mL/min (ref >60)
GFR calc non Af Amer: >60 mL/min (ref >60)
GLUCOSE: 158 mg/dL — AB (ref 65–99)
POTASSIUM: 4.1 mmol/L (ref 3.5–5.1)
SODIUM: 135 mmol/L (ref 135–145)
Total Bilirubin: 0.4 mg/dL (ref 0.3–1.2)
Total Protein: 7 g/dL (ref 6.5–8.1)

## 2015-06-10 LAB — POCT I-STAT 3, ART BLOOD GAS (G3+)
ACID-BASE EXCESS: 5 mmol/L — AB (ref 0.0–2.0)
BICARBONATE: 30.1 meq/L — AB (ref 20.0–24.0)
O2 Saturation: 98 %
PO2 ART: 110 mmHg — AB (ref 80.0–100.0)
Patient temperature: 100.2
TCO2: 31 mmol/L (ref 0–100)
pCO2 arterial: 45.7 mmHg — ABNORMAL HIGH (ref 35.0–45.0)
pH, Arterial: 7.43 (ref 7.350–7.450)

## 2015-06-10 LAB — CULTURE, BLOOD (ROUTINE X 2)
CULTURE: NO GROWTH
Culture: NO GROWTH

## 2015-06-10 LAB — PROCALCITONIN: Procalcitonin: 0.8 ng/mL

## 2015-06-10 LAB — VARICELLA-ZOSTER BY PCR: VARICELLA-ZOSTER, PCR: NEGATIVE

## 2015-06-10 LAB — MAGNESIUM: Magnesium: 2 mg/dL (ref 1.7–2.4)

## 2015-06-10 MED ORDER — FUROSEMIDE 10 MG/ML IJ SOLN
40.0000 mg | Freq: Two times a day (BID) | INTRAMUSCULAR | Status: AC
  Administered 2015-06-10 – 2015-06-11 (×4): 40 mg via INTRAVENOUS
  Filled 2015-06-10 (×4): qty 4

## 2015-06-10 MED ORDER — CEFTRIAXONE SODIUM 2 G IJ SOLR
2.0000 g | Freq: Two times a day (BID) | INTRAMUSCULAR | Status: AC
  Administered 2015-06-10 – 2015-06-17 (×15): 2 g via INTRAVENOUS
  Filled 2015-06-10 (×16): qty 2

## 2015-06-10 MED ORDER — DEXTROSE 5 % IV SOLN: 1.0000 g | INTRAVENOUS | Status: DC

## 2015-06-10 MED ORDER — HYDRALAZINE HCL 20 MG/ML IJ SOLN
10.0000 mg | INTRAMUSCULAR | Status: DC | PRN
  Administered 2015-06-10 – 2015-06-11 (×3): 40 mg via INTRAVENOUS
  Administered 2015-06-14 – 2015-06-19 (×5): 20 mg via INTRAVENOUS
  Administered 2015-06-19: 40 mg via INTRAVENOUS
  Administered 2015-06-20: 15 mg via INTRAVENOUS
  Administered 2015-06-20 (×2): 20 mg via INTRAVENOUS
  Administered 2015-06-22: 40 mg via INTRAVENOUS
  Administered 2015-06-22: 10 mg via INTRAVENOUS
  Administered 2015-06-23 (×2): 40 mg via INTRAVENOUS
  Administered 2015-06-24 – 2015-06-26 (×5): 20 mg via INTRAVENOUS
  Filled 2015-06-10 (×2): qty 1
  Filled 2015-06-10: qty 2
  Filled 2015-06-10 (×2): qty 1
  Filled 2015-06-10 (×3): qty 2
  Filled 2015-06-10 (×2): qty 1
  Filled 2015-06-10: qty 2
  Filled 2015-06-10: qty 1
  Filled 2015-06-10 (×2): qty 2
  Filled 2015-06-10 (×2): qty 1
  Filled 2015-06-10: qty 2
  Filled 2015-06-10 (×2): qty 1
  Filled 2015-06-10: qty 2
  Filled 2015-06-10 (×2): qty 1

## 2015-06-10 MED ORDER — NICARDIPINE HCL IN NACL 20-0.86 MG/200ML-% IV SOLN
0.0000 mg/h | INTRAVENOUS | Status: DC
  Administered 2015-06-10: 5 mg/h via INTRAVENOUS
  Administered 2015-06-10: 10 mg/h via INTRAVENOUS
  Administered 2015-06-11 (×11): 15 mg/h via INTRAVENOUS
  Administered 2015-06-11: 12 mg/h via INTRAVENOUS
  Administered 2015-06-11 (×2): 15 mg/h via INTRAVENOUS
  Administered 2015-06-11: 12 mg/h via INTRAVENOUS
  Administered 2015-06-12: 10 mg/h via INTRAVENOUS
  Administered 2015-06-12 (×2): 8 mg/h via INTRAVENOUS
  Filled 2015-06-10 (×3): qty 400
  Filled 2015-06-10: qty 600
  Filled 2015-06-10: qty 400
  Filled 2015-06-10: qty 200
  Filled 2015-06-10: qty 400
  Filled 2015-06-10: qty 600
  Filled 2015-06-10 (×2): qty 400

## 2015-06-10 NOTE — Progress Notes (Signed)
While bathing patient, a row of small serous filled blisters noted to be on his back/left flank. Notified Dr. Darrick Pennaeterding to assess if it is shingles. She deferred to daytime physician to assess in person. Patient placed on isolation until shingles ruled out.

## 2015-06-10 NOTE — Progress Notes (Signed)
STROKE TEAM PROGRESS NOTE  History at time of admission 60 y.o. male I am consulted to evaluate for unresponsiveness. History is obtained through chart review as patient is intubated and on propofol. The patient has a history of HTN, ETOH abuse, and prior CVAs. 2/27 EMS was summoned to the patient's home due to him being found unresponsive and shaking episodes by family around 5pm. He was last seen normal at 4:30pm. He has a history of ETOH abuse and last drink was today. Blood alcohol level was undetectable. Per family, he may have smoked crack cocaine in the past few days but his UTOX screen was negative for any drugs of abuse.   In the [facility] emergency department, staff noted urinary incontinence, leftward gaze deviation, right sided facial droop, and posturing on the left side. He then proceeded to have seizure like activity with limited airway protection, and thus was intubated. He had a CT of the head which showed, no acute abnormality, but chronic bilateral infarcts. He was loaded with 1Gram IV Keppra and started on propofol for sedation/seizure management. He was transferred to [facility] for ICU admission and neurology evaluation.   Patient has subsequently developed a fever and continues to posture.   SUBJECTIVE (INTERVAL HISTORY) His RN is at the bedside.  Pt still intubated and off sedation, not following commands or open eyes. Due to concerning for NMS/malignant hyperthermia, dantrolene was started on Q4h bases. Pt temp, HR, RR significantly improved, muscle stiffness also much improved.  However, since that time, patient has been noted to have shingles like rash and bilateral pneumonia    OBJECTIVE Temp:  [98.6 F (37 C)-101.3 F (38.5 C)] 100.9 F (38.3 C) (03/05 0700) Pulse Rate:  [82-122] 103 (03/05 0700) Cardiac Rhythm:  [-] Normal sinus rhythm (03/04 2000) Resp:  [19-34] 23 (03/05 0700) BP: (116-166)/(72-122) 145/97 mmHg (03/05 0700) SpO2:  [92 %-100 %] 100 % (03/05  0700) FiO2 (%):  [40 %] 40 % (03/05 0341) Weight:  [58 kg (127 lb 13.9 oz)] 58 kg (127 lb 13.9 oz) (03/05 0500)  CBC:  Recent Labs Lab 06/06/15 0338  06/09/15 0307 06/10/15 0246  WBC 10.5  < > 4.8 7.5  NEUTROABS 8.4*  --   --  5.8  HGB 13.7  < > 11.8* 11.2*  HCT 40.7  < > 34.2* 33.4*  MCV 89.5  < > 88.1 90.5  PLT 172  < > 132* 196  < > = values in this interval not displayed.  Basic Metabolic Panel:   Recent Labs Lab 06/08/15 0548 06/09/15 0307 06/10/15 0246  NA 137 136 135  K 4.1 3.3* 4.1  CL 96* 94* 98*  CO2 GLUCOSE 162* 178* 158*  BUN CREATININE 0.88 1.01 0.79  CALCIUM 8.0* 8.3* 8.7*  MG 1.7 1.8 2.0  PHOS 2.5 4.2  --     Lipid Panel:     Component Value Date/Time   CHOL 114 06/05/2015 0538   TRIG 513* 06/08/2015 0548   HDL 36* 06/05/2015 0538   CHOLHDL 3.2 06/05/2015 0538   VLDL 15 06/05/2015 0538   LDLCALC 63 06/05/2015 0538   HgbA1c:  Lab Results  Component Value Date   HGBA1C 5.8* 06/05/2015   Urine Drug Screen:     Component Value Date/Time   LABOPIA NONE DETECTED 06/04/2015 1840   COCAINSCRNUR NONE DETECTED 06/04/2015 1840   LABBENZ NONE DETECTED 06/04/2015 1840   AMPHETMU NONE DETECTED 06/04/2015 1840  THCU NONE DETECTED 06/04/2015 1840   LABBARB NONE DETECTED 06/04/2015 1840      IMAGING I have personally reviewed the radiological images below and agree with the radiology interpretations.  Ct Head Wo Contrast 06/04/2015   1.  No acute intracranial abnormality.  2. Remote bilateral basal ganglia lacunar infarcts.  3. Apparent soft tissue fullness in the nasopharynx could relate to retained secretions, especially given absence of mastoid effusions. Consider physical exam correlation to exclude unlikely soft tissue mass in this area.   Mr Brain Wo Contrast 06/05/2015    Acute LEFT basal ganglia lacunar infarct.  Old bilateral basal ganglia and thalamus lacunar infarcts.  Old small LEFT cerebellar infarct. Moderate  to severe chronic small vessel ischemic disease.   Dg Chest Port 1 View 06/10/2015:   Tip of endotracheal tube projects 3.2 cm above carina. Nasogastric tube extends into stomach. Upper normal heart size. Stable mediastinal contours. Increased BILATERAL lower lobe infiltrates. No gross pleural effusion or pneumothorax.  06/05/2015  1. Lines and tubes stable position. 2. Low lung volumes with mild bibasilar atelectasis  06/04/2015   1. Endotracheal tube tip well-positioned 4 cm above the carina. 2. Clear lungs.  06/04/2015   Endotracheal tube in the right mainstem bronchus. Recommend retraction and repositioning by approximately 5-6 cm. Mild left lung base interstitial markings.  06/04/2015  1. Endotracheal tube tip now projects 5.5 cm above the carina. No other change from the prior study.    CTA head and neck No extracranial or intracranial stenosis of significance to explain the patient's acute LEFT basal ganglia infarct. Specifically no LEFT MCA dissection or thrombus. Chronic changes as described. Developing cytotoxic edema LEFT basal ganglia and white matter corresponding to restricted diffusion on MR. No hemorrhagic transformation.   2D echo - Normal LV size and systolic function, EF 60-65%. Normal RV size and systolic function. No significant valvular abnormalities. Small circumferential pericardial effusion.   EEG - This is an unremarkable awake and drowsy routine inpatient EEG. Clinical correlation is recommended .    LTM EEG This is an abnormal EEG due to the presence of generalized polymorphic delta slowing, suggesting severe encephalopathy, but medication effect (e.g., benzodiazepine, propofol) could not be excluded. No epileptiform discharges or seizures were in evidence.   PHYSICAL EXAM  Temp:  [98.6 F (37 C)-101.3 F (38.5 C)] 100.9 F (38.3 C) (03/05 0700) Pulse Rate:  [82-122] 103 (03/05 0700) Resp:  [19-34] 23 (03/05 0700) BP: (116-166)/(72-122) 145/97 mmHg  (03/05 0700) SpO2:  [92 %-100 %] 100 % (03/05 0700) FiO2 (%):  [40 %] 40 % (03/05 0341) Weight:  [58 kg (127 lb 13.9 oz)] 58 kg (127 lb 13.9 oz) (03/05 0500)  General - thin built, well developed, intubated on vent off sedation.  HEENT:  NCAT; right pupil slightly smaller, but both reactive;   Chest:  CTA'  Cardiovascular - Regular rhythm and rate.  Neuro  Mental Status:  Does not awaken to voice or follow commands  Cranial Nerves:  right pupil slightly smaller, but both reactive; OCR's intact; weak corneals, spontaneous cough  Motor/Sensory:  Less movement on the right; left side is weak but purposeful  Coordination/Gait:  Deferred   ASSESSMENT/PLAN [name] is a 59 y.o. male with history of HTN, ETOH abuse, and prior CVAs found unresponsive and shaking at home. Found to have urinary incontinence, leftward gaze deviation, right sided facial droop, and posturing on the left side. At AP, loaded with Keppra and intubated. Transferred to [facility] for  treatment.  He did not receive IV t-PA.   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 Check a CK.  CK has been trending down, now afebrile. Last does scheduled for 8am tomorrow morning. Will discontinue at that time, spoke to pharmacy at [phone].   ? Delirium tremor  Heavy alcoholic  Alcohol level undetectable this time  AMS with stuporous  needing intubation  Stimulation induced bilateral posturing  Intubated and on FA, B1 and MVit  Supportive care  Seziure unlikely  Hx seizures, status epilepticus  Stimulation induced b/l posturing - much improved after dantrolene  Loaded with Keppra. On 1000 bid - discontinued as no evidence of seizure.  EEG unremarkable  LTM EEG no seizure activity  Seizure precautions.  Acute Respiratory Failure  Intubated  Sedated initially, off propofol now  Sputum culture - strep pneumo - on rocephine  CCM on board  Stroke:  left basal ganglia infarct, but not able to explain presenting symptoms, likely secondary to small vessel disease source   MRI  L BG small infarct. Old bilateral basal ganglia and thalamic infarcts. Old L cerebellar infarct.  CTA head and neck unremarkable   2D Echo  EF 60-65%   May need to consider TEE to rule out endocarditis if blood culture positive  Blood cultures - no growth 3 days  Tracheal aspirate culture - moderate Streptococcus pneumoniae - day #3 IV Rocephin  LDL 63  HgbA1c 5.8  Heparin 5000 units sq tid for VTE prophylaxis Diet NPO time specified  No antithrombotic prior to admission, now on aspirin 300 mg suppository daily.  Ongoing aggressive stroke risk factor management  Therapy recommendations:  pending   Disposition:  pending  (lives with niece)  Hypertension  Stable  Alcohol abuse  Alcohol not detectable  On FA, MVit, and B1  CIWA protocol  Other Stroke Risk Factors  Hx of Crack Cocaine use - UDS negative this admission  Other Active Problems  Elevated CK  Hospital day # 6  CRITICAL CARE NEUROLOGY ATTENDING NOTE Patient was seen and examined by me personally. I reviewed notes, independently viewed imaging studies, participated in medical decision making and plan of care. I have made additions or clarifications directly to the above note.  Documentation accurately reflects findings. The laboratory and  radiographic studies were personally reviewed by me.  ROS:  pertinent positives could not be fully documented due to LOC  Assessment and plan completed by me personally and fully documented above/below  Will D/C Dantrolene  Will give high dose Rocephin, Vanco, ampicillin until CSF results return.  Will also give acyclovir until HSV PCR returns  Infection control team to determine if patient needs negative pressure room.  Shingles like rash also noted  Will monitor CBC for slowly worsening anemia  Would reculture if patient spikes temp again  Condition is unchanged   This patient is critically ill and at  significant risk of neurological worsening, death and care requires constant monitoring of vital signs, hemodynamics,respiratory and cardiac monitoring, extensive review of multiple databases, frequent neurological assessment, discussion with family, other specialists and medical decision making of high complexity.  This critical care time does not reflect procedure time, or teaching time or supervisory time of PA/NP/Med Resident etc. but could involve care discussion time.  I spent 30 minutes of Neurocritical Care time in the care of  this patient.  SIGNED BY: Dr. Sula Sodahere Salina Stanfield     To contact Stroke Continuity provider, please refer to [email or web address]. After hours, contact General Neurology

## 2015-06-10 NOTE — Progress Notes (Signed)
eLink Physician-Brief Progress Note Patient Name: Carlisle CaterClay Recinos DOB: 11-Jul-1955 MRN: [id number]   Date of Service  06/10/2015  HPI/Events of Note  BP shot up No seizure activity Febrile, tachy  eICU Interventions  cardene gtt hydrallazine prn     Intervention Category Major Interventions: Hypertension - evaluation and management  ALVA,RAKESH V. 06/10/2015, 8:09 PM

## 2015-06-10 NOTE — Progress Notes (Signed)
eLink Physician-Brief Progress Note Patient Name: Gregory Parker DOB: 28-Jan-1956 MRN: [id number]   Date of Service  06/10/2015  HPI/Events of Note  Call from bedside nurse reporting concern over potential herpes zoster infection in patient.  Camera check shows one line of linear vesicles on the left posterior lateral aspect of the patients back.  eICU Interventions  Plan: AM rounding MD to be informed so he can round first on this patient. Will hold on interventions other than airborne and contact isolation until see by rounding MD. Consider tzanck prep/acycolvir     Intervention Category Intermediate Interventions: Infection - evaluation and management  Otilio Groleau 06/10/2015, 6:32 AM

## 2015-06-10 NOTE — Progress Notes (Signed)
Notified E-Link about pt continuing to be intractable to tempeture and BP management. Interventions and PRN meds have been performed. ELink RN notified. Md to contact with further orders.

## 2015-06-10 NOTE — Progress Notes (Signed)
PULMONARY / CRITICAL CARE MEDICINE   Name: Gregory Parker MRN: [id number] DOB: 02/23/1956    ADMISSION DATE:  06/04/2015 CONSULTATION DATE:  06/04/2015  REFERRING MD:  EDP at [place]:  AMS  HISTORY OF PRESENT ILLNESS:  Patient is encephalopathic, therefore; HPI obtained from chart review. 60 year old male with PMH as below, which is significant for HTN, ETOH, and CVA. 2/27 EMS was dispatched to the patient's home due to reports of shaking episodes with decreased responsiveness. He has a history of ETOH abuse and family reports that last drink was today, and he has only had one drink today. Family also reports that he may have smoked crack over the weekend. He was last seen normal 430 PM. In the emergency department, staff noted urinary incontinence, leftward gaze deviation, R facial droop, and posturing on the L side. He then proceeded to have seizure like activity with limited airway protection, and thus was intubated by EDP. He had a CT of the head which showed, no acute abnormality, but remote basal ganglia infarcts. He was loaded with 1G IV Keppra and started on propofol for sedation/seizure management. He was transferred to [facility] for ICU admission and neurology evaluation.    SUBJECTIVE: rash  VITAL SIGNS: BP 151/89 mmHg  Pulse 111  Temp(Src) 101.1 F (38.4 C) (Core (Comment))  Resp 30  Ht '5\' 4"'$  (1.626 m)  Wt 58 kg (127 lb 13.9 oz)  BMI 21.94 kg/m2  SpO2 100%  HEMODYNAMICS:    VENTILATOR SETTINGS: Vent Mode:  [-] PSV;CPAP FiO2 (%):  [40 %] 40 % Set Rate:  [14 bmp] 14 bmp Vt Set:  [420 mL] 420 mL PEEP:  [5 cmH20] 5 cmH20 Pressure Support:  [8 cmH20] 8 cmH20 Plateau Pressure:  [6 cmH20-24 cmH20] 21 cmH20  INTAKE / OUTPUT: I/O last 3 completed shifts: In: 4175.4 [Other:360; NG/GT:2040; IV Piggyback:1775.4] Out: 5105 [Urine:5105]  PHYSICAL EXAMINATION: General: AA male, in NAD. Neuro: rass -3 HEENT: [place]/AT. PERRL, sclerae  anicteric. Cardiovascular: s1 s2 RRT  Lungs: Coarse Abdomen: BS x 4, soft, NT/ND.  Musculoskeletal: No gross deformities, no edema.  Skin: Intact, warm, small arc traumatic lesion, NOT reflecting shingles and resolved, appears to be scratches  BMET  Recent Labs Lab 06/08/15 0548 06/09/15 0307 06/10/15 0246  NA 137 136 135  K 4.1 3.3* 4.1  CL 96* 94* 98*  CO2 '29 29 25  '$ BUN '13 12 14  '$ CREATININE 0.88 1.01 0.79  GLUCOSE 162* 178* 158*   Electrolytes  Recent Labs Lab 06/07/15 0513 06/08/15 0548 06/09/15 0307 06/10/15 0246  CALCIUM 8.3* 8.0* 8.3* 8.7*  MG 1.7 1.7 1.8 2.0  PHOS 2.9 2.5 4.2  --    CBC  Recent Labs Lab 06/08/15 0548 06/09/15 0307 06/10/15 0246  WBC 6.2 4.8 7.5  HGB 12.2* 11.8* 11.2*  HCT 34.1* 34.2* 33.4*  PLT 110* 132* 196   Coag's  Recent Labs Lab 06/04/15 1753  INR 0.97   Sepsis Markers  Recent Labs Lab 06/04/15 1800 06/04/15 2308 06/05/15 0414 06/06/15 0338 06/07/15 0513  LATICACIDVEN 1.24 1.3  --   --   --   PROCALCITON  --   --  0.21 0.30 1.99   ABG  Recent Labs Lab 06/08/15 0437 06/09/15 0500 06/10/15 0421  PHART 7.501* 7.510* 7.430  PCO2ART 35.9 40.1 45.7*  PO2ART 87.1 137* 110.0*   Liver Enzymes  Recent Labs Lab 06/04/15 1753 06/10/15 0246  AST 27 140*  ALT 22 82*  ALKPHOS  75 82  BILITOT 0.7 0.4  ALBUMIN 4.6 2.6*   Cardiac Enzymes  Recent Labs Lab 06/04/15 2308  TROPONINI 0.04*   Glucose  Recent Labs Lab 06/09/15 0351 06/09/15 0723 06/09/15 1534 06/09/15 1925 06/09/15 2321 06/10/15 0816  GLUCAP 160* 161* 134* 135* 164* 186*   Imaging Dg Chest Port 1 View  06/10/2015  CLINICAL DATA:  Pneumonia, history stroke EXAM: PORTABLE CHEST 1 VIEW COMPARISON:  Portable exam 0532 hours compared to 06/09/2015 FINDINGS: Tip of endotracheal tube projects 3.2 cm above carina. Nasogastric tube extends into stomach. Upper normal heart size. Stable mediastinal contours. Increased BILATERAL lower lobe infiltrates.  Upper lungs clear. No gross pleural effusion or pneumothorax. IMPRESSION: Increased BILATERAL lower lobe infiltrates. Electronically Signed   By: Lavonia Dana M.D.   On: 06/10/2015 07:41   STUDIES:  CT head 2/27 >  No acute intracranial abnormality. Remote bilateral basal ganglia lacunar infarcts. Apparent soft tissue fullness in the nasopharynx could relate to retained secretions, especially given absence of mastoid effusions.  MRI brain 3/3>>>acute left basal ganglia lacunar  CULTURES: BCx2 2/27 > 3/2>>> strep PNA  ANTIBIOTICS:  SIGNIFICANT EVENTS: 2/27 AMS, ? Seizure, intubated in [facility] ED, tx to [facility] ICU  LINES/TUBES: ETT 2/27>>>  ASSESSMENT / PLAN:  NEUROLOGIC A:   Seizures with concern for status epilepticus. Hx ETOH and polysubstance abuse (crack cocaine). P:   PRN fentanyl. RASS goal: 0 to -1 Keppra incresed by Neuro Further recommendations per neurology. Dc dantrolene  Thiamine/folate/MVI. Limiting sedation  PULMONARY A: Acute hypoxemic respiratory failure secondary to seizures, resp alk Concern PNA Increase int prominence P:   abg reviewed, keep same MV, may reduce slight Wean PS to goal 5 today Need improved neurostatus pcxr in am  Consider lasix  CARDIOVASCULAR A:  H/o HTN. coc use P:  Monitor hemodynamics Trend Troponin PRN Hydralazine for SBP>150 Used calcium channel if needed  RENAL A:   Improved renal FXN Pos balance P:   kvo Add lasix  GASTROINTESTINAL A:   ETOH abuse. GI prophylaxis. Nutrition. P:   Continue protonix. Consult nutrition for TF as per nutrition.  HEMATOLOGIC A:   VTE prophylaxis. P:  Continue SCD's/heparin. Limit phebotomy   INFECTIOUS A:   Concern for meningitis  Concern PNA R/o hsv P:   LP 3/1>>>negative Vancomycin 3/1>>>3/2 Rocephin 3/1>>>3/2>>>3/2>>> Amp 3/1>>>3/2 Acycolovir 3/1>>>3/2  Sputum with pneumococcus.  Rocephin restarted 3/2. Treat 8 days Fever from neuro? Get pct   ENDOCRINE A:    No acute issues.   P:   No interventions required.  Ccm time 30 min  Lavon Paganini. Titus Mould, MD, [place] Pgr: [facility]

## 2015-06-11 ENCOUNTER — Inpatient Hospital Stay (HOSPITAL_COMMUNITY): Payer: Medicare Other

## 2015-06-11 DIAGNOSIS — I639 Cerebral infarction, unspecified: Secondary | ICD-10-CM | POA: Insufficient documentation

## 2015-06-11 DIAGNOSIS — I63032 Cerebral infarction due to thrombosis of left carotid artery: Secondary | ICD-10-CM

## 2015-06-11 DIAGNOSIS — G9340 Encephalopathy, unspecified: Secondary | ICD-10-CM

## 2015-06-11 LAB — BASIC METABOLIC PANEL
ANION GAP: 16 — AB (ref 5–15)
BUN: 27 mg/dL — ABNORMAL HIGH (ref 6–20)
CALCIUM: 8.8 mg/dL — AB (ref 8.9–10.3)
CO2: 24 mmol/L (ref 22–32)
Chloride: 98 mmol/L — ABNORMAL LOW (ref 101–111)
Creatinine, Ser: 0.95 mg/dL (ref 0.61–1.24)
Glucose, Bld: 168 mg/dL — ABNORMAL HIGH (ref 65–99)
Potassium: 3.5 mmol/L (ref 3.5–5.1)
SODIUM: 138 mmol/L (ref 135–145)

## 2015-06-11 LAB — CBC
HEMATOCRIT: 33.4 % — AB (ref 39.0–52.0)
Hemoglobin: 11.8 g/dL — ABNORMAL LOW (ref 13.0–17.0)
MCH: 31.5 pg (ref 26.0–34.0)
MCHC: 35.3 g/dL (ref 30.0–36.0)
MCV: 89.1 fL (ref 78.0–100.0)
PLATELETS: 185 10*3/uL (ref 150–400)
RBC: 3.75 MIL/uL — ABNORMAL LOW (ref 4.22–5.81)
RDW: 14.2 % (ref 11.5–15.5)
WBC: 19.4 10*3/uL — AB (ref 4.0–10.5)

## 2015-06-11 LAB — GLUCOSE, CAPILLARY
GLUCOSE-CAPILLARY: 148 mg/dL — AB (ref 65–99)
GLUCOSE-CAPILLARY: 166 mg/dL — AB (ref 65–99)
Glucose-Capillary: 174 mg/dL — ABNORMAL HIGH (ref 65–99)
Glucose-Capillary: 108 mg/dL — ABNORMAL HIGH (ref 65–99)
Glucose-Capillary: 162 mg/dL — ABNORMAL HIGH (ref 65–99)
Glucose-Capillary: 154 mg/dL — ABNORMAL HIGH (ref 65–99)

## 2015-06-11 LAB — PROCALCITONIN: Procalcitonin: 1.51 ng/mL

## 2015-06-11 LAB — TRIGLYCERIDES: Triglycerides: 182 mg/dL — ABNORMAL HIGH (ref <150)

## 2015-06-11 LAB — PHOSPHORUS: PHOSPHORUS: 3.7 mg/dL (ref 2.5–4.6)

## 2015-06-11 LAB — MAGNESIUM: Magnesium: 1.9 mg/dL (ref 1.7–2.4)

## 2015-06-11 LAB — HIV ANTIBODY (ROUTINE TESTING W REFLEX): HIV SCREEN 4TH GENERATION: NONREACTIVE

## 2015-06-11 MED ORDER — PANTOPRAZOLE SODIUM 40 MG PO PACK
40.0000 mg | PACK | Freq: Every day | ORAL | Status: DC
  Administered 2015-06-11 – 2015-06-23 (×13): 40 mg
  Filled 2015-06-11 (×10): qty 20

## 2015-06-11 MED ORDER — LABETALOL HCL 5 MG/ML IV SOLN
10.0000 mg | INTRAVENOUS | Status: DC | PRN
  Administered 2015-06-11: 10 mg via INTRAVENOUS
  Administered 2015-06-12 – 2015-06-15 (×4): 20 mg via INTRAVENOUS
  Administered 2015-06-16 (×2): 10 mg via INTRAVENOUS
  Administered 2015-06-16 – 2015-06-18 (×5): 20 mg via INTRAVENOUS
  Administered 2015-06-19: 10 mg via INTRAVENOUS
  Administered 2015-06-19 (×2): 15 mg via INTRAVENOUS
  Administered 2015-06-19: 20 mg via INTRAVENOUS
  Administered 2015-06-19: 10 mg via INTRAVENOUS
  Administered 2015-06-20: 15 mg via INTRAVENOUS
  Administered 2015-06-20 – 2015-06-25 (×10): 20 mg via INTRAVENOUS
  Filled 2015-06-11 (×26): qty 4

## 2015-06-11 NOTE — Procedures (Signed)
HPI:  60 year old male with PMH as below, which is significant for HTN, ETOH, and CVA. 2/27 EMS was dispatched to the patient's home due to reports of shaking episodes with decreased responsiveness. He has a history of ETOH abuse and seizure. Family also reports that he may have smoked crack over the weekend. He was last seen normal 430 PM. In the emergency department, staff noted urinary incontinence, leftward gaze deviation, R facial droop, and posturing on the L side. He then proceeded to have seizure like activity with limited airway protection, and thus was intubated by EDP. He had a CT of the head which showed, no acute abnormality, but remote basal ganglia infarcts. He was loaded with 1G IV Keppra and started on propofol for sedation/seizure management. He was found to have NMS and ultimately taken off of Keppra.  Also had a basal ganglia infarction.   TECHNICAL SUMMARY:  A multichannel referential and bipolar montage EEG using the standard international 10-20 system was performed on the patient described as awake but nonresponsive to verbal commands.  The dominant background activity consists of 8.5 hertz activity seen most prominantly over the posterior head region.  Low voltage fast (beta) activity is distributed symmetrically and maximally over the anterior head regions.  Portions of the tracing were contaminated with significant amounts of myogenic artifact due to the patient shivering, moving and respiratory suctioning the patient.  ACTIVATION:  No activation procedures  EPILEPTIFORM ACTIVITY:  There were no spikes, sharp waves or paroxysmal activity.  SLEEP:  No sleep was noted  CARDIAC:  The EKG lead was not well recorded  IMPRESSION:  This is a normal EEG for the patients stated age.  There were no focal, hemispheric or lateralizing features.  No epileptiform activity was recorded.  This EEG had not changed significantly compared to the patients June 05, 2015 EEG.  As above,  portions of the tracing were contaminated with significant amounts of myogenic artifact, making those portions difficult to interpret.

## 2015-06-11 NOTE — Progress Notes (Signed)
PULMONARY / CRITICAL CARE MEDICINE   Name: Gregory Parker MRN: [id number] DOB: May 11, 1955    ADMISSION DATE:  06/04/2015 CONSULTATION DATE:  06/04/2015  REFERRING MD:  EDP at [place]:  AMS  HISTORY OF PRESENT ILLNESS:  Patient is encephalopathic, therefore; HPI obtained from chart review. 60 year old male with PMH as below, which is significant for HTN, ETOH, and CVA. 2/27 EMS was dispatched to the patient's home due to reports of shaking episodes with decreased responsiveness. He has a history of ETOH abuse and family reports that last drink was today, and he has only had one drink today. Family also reports that he may have smoked crack over the weekend. He was last seen normal 430 PM. In the emergency department, staff noted urinary incontinence, leftward gaze deviation, R facial droop, and posturing on the L side. He then proceeded to have seizure like activity with limited airway protection, and thus was intubated by EDP. He had a CT of the head which showed, no acute abnormality, but remote basal ganglia infarcts. He was loaded with 1G IV Keppra and started on propofol for sedation/seizure management. He was transferred to [facility] for ICU admission and neurology evaluation.   SIGNIFICANT EVENTS: 2/27 AMS, ? Seizure, intubated in [facility] ED, tx to [facility] ICU 3/5 cardene gtt 3/5 dantrolene stopped   SUBJECTIVE: febrile - diff between foley temp & rectal probe BP high overnight- cardene gtt  remains tachy, unresponsive  VITAL SIGNS: BP 132/57 mmHg  Pulse 143  Temp(Src) 102.9 F (39.4 C) (Core (Comment))  Resp 36  Ht '5\' 4"'$  (1.626 m)  Wt 59.3 kg (130 lb 11.7 oz)  BMI 22.43 kg/m2  SpO2 96%  HEMODYNAMICS:    VENTILATOR SETTINGS: Vent Mode:  [-] PRVC FiO2 (%):  [40 %] 40 % Set Rate:  [14 bmp] 14 bmp Vt Set:  [420 mL] 420 mL PEEP:  [5 cmH20] 5 cmH20 Pressure Support:  [10 cmH20] 10 cmH20 Plateau Pressure:  [12 cmH20] 12 cmH20  INTAKE / OUTPUT: I/O last 3  completed shifts: In: 4242.5 [I.V.:1250.7; Other:260; NG/GT:2040; IV Piggyback:691.8] Out: 2625 [Urine:2625]  PHYSICAL EXAMINATION: General: AA male, in NAD. Neuro: rass -3, unresponsive, flexion postures on DPS HEENT: [place]/AT. PERRL, sclerae anicteric. Cardiovascular: s1 s2 RRT  Lungs: Coarse Abdomen: BS x 4, soft, NT/ND.  Musculoskeletal: No gross deformities, no edema.  Skin: Intact, warm, small arc traumatic lesion, NOT reflecting shingles and resolved, appears to be scratches  BMET  Recent Labs Lab 06/09/15 0307 06/10/15 0246 06/11/15 0555  NA 136 135 138  K 3.3* 4.1 3.5  CL 94* 98* 98*  CO2 '29 25 24  '$ BUN 12 14 27*  CREATININE 1.01 0.79 0.95  GLUCOSE 178* 158* 168*   Electrolytes  Recent Labs Lab 06/08/15 0548 06/09/15 0307 06/10/15 0246 06/11/15 0555  CALCIUM 8.0* 8.3* 8.7* 8.8*  MG 1.7 1.8 2.0 1.9  PHOS 2.5 4.2  --  3.7   CBC  Recent Labs Lab 06/09/15 0307 06/10/15 0246 06/11/15 0555  WBC 4.8 7.5 19.4*  HGB 11.8* 11.2* 11.8*  HCT 34.2* 33.4* 33.4*  PLT 132* 196 185   Coag's  Recent Labs Lab 06/04/15 1753  INR 0.97   Sepsis Markers  Recent Labs Lab 06/04/15 1800 06/04/15 2308  06/07/15 0513 06/10/15 1032 06/11/15 0555  LATICACIDVEN 1.24 1.3  --   --   --   --   PROCALCITON  --   --   < > 1.99 0.80 1.51  < > =  values in this interval not displayed. ABG  Recent Labs Lab 06/08/15 0437 06/09/15 0500 06/10/15 0421  PHART 7.501* 7.510* 7.430  PCO2ART 35.9 40.1 45.7*  PO2ART 87.1 137* 110.0*   Liver Enzymes  Recent Labs Lab 06/04/15 1753 06/10/15 0246  AST 27 140*  ALT 22 82*  ALKPHOS 75 82  BILITOT 0.7 0.4  ALBUMIN 4.6 2.6*   Cardiac Enzymes  Recent Labs Lab 06/04/15 2308  TROPONINI 0.04*   Glucose  Recent Labs Lab 06/10/15 1145 06/10/15 1551 06/10/15 2105 06/11/15 0106 06/11/15 0423 06/11/15 0835  GLUCAP 162* 151* 157* 174* 108* 162*   Imaging Dg Chest Port 1 View  06/11/2015  CLINICAL DATA:  Pulmonary  edema . EXAM: PORTABLE CHEST 1 VIEW COMPARISON:  06/10/2015. FINDINGS: Endotracheal tube and NG tube in stable position. Heart size stable. Pulmonary vascularity normal. Interim partial clearing of bilateral pulmonary infiltrates and/or edema. Small left pleural effusion. No pneumothorax. IMPRESSION: 1. Lines and tubes stable position. 2. Interim partial clearing of bilateral pulmonary infiltrates and/or edema. Small left pleural effusion. Electronically Signed   By: Marcello Moores  Register   On: 06/11/2015 07:35   STUDIES:  CT head 2/27 >  No acute intracranial abnormality. Remote bilateral basal ganglia lacunar infarcts. Apparent soft tissue fullness in the nasopharynx could relate to retained secretions, especially given absence of mastoid effusions.  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 P:   PRN fentanyl. RASS goal: 0  Keppra incresed by Neuro Further recommendations per neurology. Dc'd  dantrolene  Thiamine/folate/MVI. Temp control   PULMONARY A: Acute hypoxemic respiratory failure secondary to seizures, resp alk Concern PNA -resolving infx P:   SBTs as tolerated but hold off extubation until mental status improves  CARDIOVASCULAR A:  H/o HTN. cocaine use P:  cardene gtt Add labetalol - should be safe at least a week since admit PRN Hydralazine for SBP>150 Used calcium channel if needed  RENAL A: Mild rhabdo  Improved renal FXN Hypokalemia P:   replete lytes as needed Hold lasix  GASTROINTESTINAL A:   ETOH abuse. GI prophylaxis. Nutrition. P:   Continue protonix. ct TF  per nutrition.  HEMATOLOGIC A:   VTE prophylaxis. P:  Continue SCD's/heparin. Limit phebotomy    INFECTIOUS A:   Concern for meningitis  Concern PNA R/o hsv P:   LP 3/1>>>negative Vancomycin 3/1>>>3/2 Rocephin 3/1>>>3/2>>>3/2>>> Amp 3/1>>>3/2 Acycolovir 3/1>>>3/2  Sputum with pneumococcus.  Rocephin restarted 3/2. Treat 8 days Fever from neuro? Pct rising  ENDOCRINE A:   No acute issues.   P:   No interventions required.  Summary - Cause of encephalopathy not clear but likely metabolic High fever unlikely to be malignant hyperthermia, now off dantrolene  The patient is critically ill with multiple organ systems failure and requires high complexity decision making for assessment and support, frequent evaluation and titration of therapies, application of advanced monitoring technologies and extensive interpretation of multiple databases. Critical Care Time devoted to patient care services described in this note independent of APP time is 35 minutes.   Kara Mead MD. Shade Flood. [facility] Pager [phone] If no response call [phone]

## 2015-06-11 NOTE — Progress Notes (Signed)
STROKE TEAM PROGRESS NOTE  History at time of admission 60 y.o. male I am consulted to evaluate for unresponsiveness. History is obtained through chart review as patient is intubated and on propofol. The patient has a history of HTN, ETOH abuse, and prior CVAs. 2/27 EMS was summoned to the patient's home due to him being found unresponsive and shaking episodes by family around 5pm. He was last seen normal at 4:30pm. He has a history of ETOH abuse and last drink was today. Blood alcohol level was undetectable. Per family, he may have smoked crack cocaine in the past few days but his UTOX screen was negative for any drugs of abuse.   In the [facility] emergency department, staff noted urinary incontinence, leftward gaze deviation, right sided facial droop, and posturing on the left side. He then proceeded to have seizure like activity with limited airway protection, and thus was intubated. He had a CT of the head which showed, no acute abnormality, but chronic bilateral infarcts. He was loaded with 1Gram IV Keppra and started on propofol for sedation/seizure management. [facility] and neurology evaluation.   Patient has subsequently developed a fever and continues to posture.   SUBJECTIVE (INTERVAL HISTORY) His RN is at the bedside.  Pt still intubated and off sedation, not following commands or open eyes.  Dantrolene was discontinued yesterday. There has not been significant increase in the patient's tone or rigidity however he has been febrile. He however has bilateral infiltrates on chest x-ray and likely has pneumonia.  OBJECTIVE Temp:  [100 F (37.8 C)-102.9 F (39.4 C)] 102.9 F (39.4 C) (03/06 0800) Pulse Rate:  [112-153] 129 (03/06 1533) Cardiac Rhythm:  [-] Sinus tachycardia (03/06 0800) Resp:  [24-45] 31 (03/06 1533) BP: (110-234)/(56-132) 149/56 mmHg (03/06 1533) SpO2:  [94 %-100 %] 95 % (03/06 1533) FiO2 (%):  [40 %] 40 % (03/06 1533) Weight:   [130 lb 11.7 oz (59.3 kg)] 130 lb 11.7 oz (59.3 kg) (03/06 0444)  CBC:  Recent Labs Lab 06/06/15 0338  06/10/15 0246 06/11/15 0555  WBC 10.5  < > 7.5 19.4*  NEUTROABS 8.4*  --  5.8  --   HGB 13.7  < > 11.2* 11.8*  HCT 40.7  < > 33.4* 33.4*  MCV 89.5  < > 90.5 89.1  PLT 172  < > 196 185  < > = values in this interval not displayed.  Basic Metabolic Panel:   Recent Labs Lab 06/09/15 0307 06/10/15 0246 06/11/15 0555  NA 136 135 138  K 3.3* 4.1 3.5  CL 94* 98* 98*  CO2 29 25 24   GLUCOSE 178* 158* 168*  BUN 12 14 27*  CREATININE 1.01 0.79 0.95  CALCIUM 8.3* 8.7* 8.8*  MG 1.8 2.0 1.9  PHOS 4.2  --  3.7    Lipid Panel:     Component Value Date/Time   CHOL 114 06/05/2015 0538   TRIG 182* 06/11/2015 0555   HDL 36* 06/05/2015 0538   CHOLHDL 3.2 06/05/2015 0538   VLDL 15 06/05/2015 0538   LDLCALC 63 06/05/2015 0538   HgbA1c:  Lab Results  Component Value Date   HGBA1C 5.8* 06/05/2015   Urine Drug Screen:     Component Value Date/Time   LABOPIA NONE DETECTED 06/04/2015 1840   COCAINSCRNUR NONE DETECTED 06/04/2015 1840   LABBENZ NONE DETECTED 06/04/2015 1840   AMPHETMU NONE DETECTED 06/04/2015 1840   THCU NONE DETECTED 06/04/2015 1840   LABBARB NONE  DETECTED 06/04/2015 1840      IMAGING I have personally reviewed the radiological images below and agree with the radiology interpretations.  Ct Head Wo Contrast 06/04/2015   1.  No acute intracranial abnormality.  2. Remote bilateral basal ganglia lacunar infarcts.  3. Apparent soft tissue fullness in the nasopharynx could relate to retained secretions, especially given absence of mastoid effusions. Consider physical exam correlation to exclude unlikely soft tissue mass in this area.   Mr Brain Wo Contrast 06/05/2015    Acute LEFT basal ganglia lacunar infarct.  Old bilateral basal ganglia and thalamus lacunar infarcts.  Old small LEFT cerebellar infarct. Moderate to severe chronic small vessel ischemic  disease.   Dg Chest Port 1 View 06/10/2015:   Tip of endotracheal tube projects 3.2 cm above carina. Nasogastric tube extends into stomach. Upper normal heart size. Stable mediastinal contours. Increased BILATERAL lower lobe infiltrates. No gross pleural effusion or pneumothorax.  06/05/2015  1. Lines and tubes stable position. 2. Low lung volumes with mild bibasilar atelectasis  06/04/2015   1. Endotracheal tube tip well-positioned 4 cm above the carina. 2. Clear lungs.  06/04/2015   Endotracheal tube in the right mainstem bronchus. Recommend retraction and repositioning by approximately 5-6 cm. Mild left lung base interstitial markings.  06/04/2015  1. Endotracheal tube tip now projects 5.5 cm above the carina. No other change from the prior study.    CTA head and neck No extracranial or intracranial stenosis of significance to explain the patient's acute LEFT basal ganglia infarct. Specifically no LEFT MCA dissection or thrombus. Chronic changes as described. Developing cytotoxic edema LEFT basal ganglia and white matter corresponding to restricted diffusion on MR. No hemorrhagic transformation.   2D echo - Normal LV size and systolic function, EF 60-65%. Normal RV size and systolic function. No significant valvular abnormalities. Small circumferential pericardial effusion.   EEG - This is an unremarkable awake and drowsy routine inpatient EEG. Clinical correlation is recommended .    LTM EEG This is an abnormal EEG due to the presence of generalized polymorphic delta slowing, suggesting severe encephalopathy, but medication effect (e.g., benzodiazepine, propofol) could not be excluded. No epileptiform discharges or seizures were in evidence.   PHYSICAL EXAM  Temp:  [100 F (37.8 C)-102.9 F (39.4 C)] 102.9 F (39.4 C) (03/06 0800) Pulse Rate:  [112-153] 129 (03/06 1533) Resp:  [24-45] 31 (03/06 1533) BP: (110-234)/(56-132) 149/56 mmHg (03/06 1533) SpO2:  [94 %-100 %] 95 %  (03/06 1533) FiO2 (%):  [40 %] 40 % (03/06 1533) Weight:  [130 lb 11.7 oz (59.3 kg)] 130 lb 11.7 oz (59.3 kg) (03/06 0444)  General - thin built, well developed, intubated on vent off sedation.  HEENT:  NCAT; right pupil slightly smaller, but both reactive;   Chest:  CTA'  Cardiovascular - Regular rhythm and rate.  Neuro  Mental Status:  Does not awaken to voice or follow commands but does open eyes partially.  Cranial Nerves:  right pupil slightly smaller, but both reactive; fundi were not visualized. Left gaze deviation. OCR's intact; weak corneals, spontaneous cough  Motor/Sensory:  Less movement on the right with decreased tone.; left side is weak but purposeful movements  Coordination/Gait:  Deferred   ASSESSMENT/PLAN [name] is a 60 y.o. male with history of HTN, ETOH abuse, and prior CVAs found unresponsive and shaking at home. Found to have urinary incontinence, leftward gaze deviation, right sided facial droop, and posturing on the left side. At [facility], loaded with  Keppra and intubated. Transferred to [facility] for treatment.  He did not receive IV t-PA.  Altered mental status and state of increased tone of unclear etiology on presentation now improving. Basal ganglia infarct is likely secondary to small vessel disease which developed after admission  Intubated,    High grade fever not responding to traditional measures - much improved  Elevated CK - trending down  Muscle stiffness throughout - much improved  Tachycardia and tachypenia - much improved  No typical offending medication has been used so far - not sure what pt took before admission  Blood culture NGTD  Sputum culture strep pneumo - on rocephine  LP done not supportive for CNS infection  CSF culture NGTD  UA negative  CXR not remarkable  EEG and LTM EEG negative for seizure   empiric treat with dantrolene Q4h - clinically much improved. Check a CK.  CK has been trending down, now afebrile.  Last does scheduled for 8am tomorrow morning. Will discontinue at that time, spoke to pharmacy at [phone].   ? Delirium tremor  Heavy alcoholic  Alcohol level undetectable this time  AMS with stuporous needing intubation  Stimulation induced bilateral posturing  Intubated and on FA, B1 and MVit  Supportive care  Seziure unlikely  Hx seizures, status epilepticus  Stimulation induced b/l posturing - much improved after dantrolene  Loaded with Keppra. On 1000 bid - discontinued as no evidence of seizure.  EEG unremarkable  LTM EEG no seizure activity  Seizure precautions.  Acute Respiratory Failure  Intubated  Sedated initially, off propofol now  Sputum culture - strep pneumo - on rocephine  CCM on board  Stroke:  left basal ganglia infarct, but not able to explain presenting symptoms, likely secondary to small vessel disease source   MRI  L BG small infarct. Old bilateral basal ganglia and thalamic infarcts. Old L cerebellar infarct.  CTA head and neck unremarkable   2D Echo  EF 60-65%   May need to consider TEE to rule out endocarditis if blood culture positive  Blood cultures - no growth 3 days  Tracheal aspirate culture - moderate Streptococcus pneumoniae - day #3 IV Rocephin  LDL 63  HgbA1c 5.8  Heparin 5000 units sq tid for VTE prophylaxis Diet NPO time specified  No antithrombotic prior to admission, now on aspirin 300 mg suppository daily.  Ongoing aggressive stroke risk factor management  Therapy recommendations:  pending   Disposition:  pending  (lives with niece)  Hypertension  Stable  Alcohol abuse  Alcohol not detectable  On FA, MVit, and B1  CIWA protocol  Other Stroke Risk Factors  Hx of Crack Cocaine use - UDS negative this admission  Other Active Problems  Elevated CK continue hydration and follow trend  Hospital day # 7  CRITICAL CARE NEUROLOGY ATTENDING NOTE Patient was seen and examined by me personally. I  reviewed notes, independently viewed imaging studies, participated in medical decision making and plan of care. I have made additions or clarifications directly to the above note.  Documentation accurately reflects findings. The laboratory and radiographic studies were personally reviewed by me. Continue ongoing supportive care and ventilatory support and treatment for pneumonia. Continue aspirin for stroke prevention and aggressive risk factor modification. Stroke team will sign off at the current time. Discussed with Dr. Vassie Loll. Family not available at the bedside. Kindly call for questions.   This patient is critically ill and at significant risk of neurological worsening, death and care requires constant monitoring of  vital signs, hemodynamics,respiratory and cardiac monitoring, extensive review of multiple databases, frequent neurological assessment, discussion with family, other specialists and medical decision making of high complexity.  This critical care time does not reflect procedure time, or teaching time or supervisory time of PA/NP/Med Resident etc. but could involve care discussion time.  I spent 35 minutes of Neurocritical Care time in the care of  this patient.  SIGNED BY: Dr. Delia Heady   To contact Stroke Continuity provider, please refer to [email or web address]. After hours, contact General Neurology

## 2015-06-11 NOTE — Progress Notes (Signed)
EEG completed, results pending. 

## 2015-06-12 ENCOUNTER — Inpatient Hospital Stay (HOSPITAL_COMMUNITY): Payer: Medicare Other

## 2015-06-12 LAB — BASIC METABOLIC PANEL
ANION GAP: 13 (ref 5–15)
BUN: 40 mg/dL — ABNORMAL HIGH (ref 6–20)
CALCIUM: 8.3 mg/dL — AB (ref 8.9–10.3)
CO2: 24 mmol/L (ref 22–32)
Chloride: 104 mmol/L (ref 101–111)
Creatinine, Ser: 1.03 mg/dL (ref 0.61–1.24)
GLUCOSE: 164 mg/dL — AB (ref 65–99)
Potassium: 3.3 mmol/L — ABNORMAL LOW (ref 3.5–5.1)
SODIUM: 141 mmol/L (ref 135–145)

## 2015-06-12 LAB — GLUCOSE, CAPILLARY
GLUCOSE-CAPILLARY: 133 mg/dL — AB (ref 65–99)
GLUCOSE-CAPILLARY: 133 mg/dL — AB (ref 65–99)
GLUCOSE-CAPILLARY: 154 mg/dL — AB (ref 65–99)
GLUCOSE-CAPILLARY: 157 mg/dL — AB (ref 65–99)
Glucose-Capillary: 155 mg/dL — ABNORMAL HIGH (ref 65–99)
Glucose-Capillary: 157 mg/dL — ABNORMAL HIGH (ref 65–99)
Glucose-Capillary: 142 mg/dL — ABNORMAL HIGH (ref 65–99)

## 2015-06-12 LAB — PROCALCITONIN: PROCALCITONIN: 3.36 ng/mL

## 2015-06-12 MED ORDER — POTASSIUM CHLORIDE 20 MEQ/15ML (10%) PO SOLN
40.0000 meq | Freq: Once | ORAL | Status: AC
  Administered 2015-06-12: 40 meq
  Filled 2015-06-12: qty 30

## 2015-06-12 MED ORDER — FENTANYL BOLUS VIA INFUSION
50.0000 ug | INTRAVENOUS | Status: DC | PRN
  Administered 2015-06-15 (×2): 100 ug via INTRAVENOUS
  Filled 2015-06-12: qty 50

## 2015-06-12 MED ORDER — VITAL AF 1.2 CAL PO LIQD
1000.0000 mL | ORAL | Status: DC
  Administered 2015-06-12 – 2015-07-03 (×27): 1000 mL
  Filled 2015-06-12 (×21): qty 1000

## 2015-06-12 MED ORDER — FUROSEMIDE 10 MG/ML IJ SOLN
40.0000 mg | Freq: Two times a day (BID) | INTRAMUSCULAR | Status: DC
  Administered 2015-06-12 (×2): 40 mg via INTRAVENOUS
  Filled 2015-06-12 (×2): qty 4

## 2015-06-12 MED ORDER — SODIUM CHLORIDE 0.9 % IV SOLN
25.0000 ug/h | INTRAVENOUS | Status: DC
  Administered 2015-06-12: 100 ug/h via INTRAVENOUS
  Administered 2015-06-13 – 2015-06-18 (×7): 150 ug/h via INTRAVENOUS
  Filled 2015-06-12 (×9): qty 50

## 2015-06-12 NOTE — Progress Notes (Signed)
PULMONARY / CRITICAL CARE MEDICINE   Name: Gregory Parker MRN: [id number] DOB: 09/19/1955    ADMISSION DATE:  06/04/2015 CONSULTATION DATE:  06/04/2015  REFERRING MD:  EDP at [facility]:  AMS  HISTORY OF PRESENT ILLNESS:  Patient is encephalopathic, therefore; HPI obtained from chart review. 60 year old male with PMH  significant for HTN, ETOH, and CVA. 2/27 EMS was dispatched to the patient's home due to reports of shaking episodes with decreased responsiveness. He has a history of ETOH abuse  Family also reports that he may have smoked crack over the weekend. He was last seen normal 430 PM. In the emergency department, staff noted urinary incontinence, leftward gaze deviation, R facial droop, and posturing on the L side. He then proceeded to have seizure like activity with limited airway protection, and thus was intubated by EDP. He had a CT of the head which showed, no acute abnormality, but remote basal ganglia infarcts. He was loaded with 1G IV Keppra and started on propofol for sedation/seizure management. He was transferred to [facility] for ICU admission and neurology evaluation.   SIGNIFICANT EVENTS: 2/27 AMS, ? Seizure, intubated in [facility] ED, tx to [facility] ICU 3/5 cardene gtt 3/5 dantrolene stopped  STUDIES:  CT head 2/27 >  No acute intracranial abnormality. Remote bilateral basal ganglia lacunar infarcts. Apparent soft tissue fullness in the nasopharynx could relate to retained secretions, especially given absence of mastoid effusions.  MRI brain 3/3>>>acute left basal ganglia lacunar EEG 3/6 nml   SUBJECTIVE: Remains febrile - Tm 102 - cardene gtt being tapered  remains tachy, unresponsive desatn with tachypnea  VITAL SIGNS: BP 147/77 mmHg  Pulse 111  Temp(Src) 101.3 F (38.5 C) (Core (Comment))  Resp 30  Ht '5\' 4"'$  (1.626 m)  Wt 62 kg (136 lb 11 oz)  BMI 23.45 kg/m2  SpO2 93%  HEMODYNAMICS:    VENTILATOR SETTINGS: Vent Mode:  [-] PRVC FiO2 (%):  [40  %-60 %] 60 % Set Rate:  [14 bmp] 14 bmp Vt Set:  [420 mL] 420 mL PEEP:  [5 cmH20] 5 cmH20  INTAKE / OUTPUT: I/O last 3 completed shifts: In: 6552.7 [I.V.:4312.7; Other:20; NG/GT:2070; IV Piggyback:150] Out: 2160 [Urine:1800; Stool:360]  PHYSICAL EXAMINATION: General: AA male, in NAD. Neuro: rass -3, unresponsive, flexion postures on DPS HEENT: [place]/AT. PERRL, sclerae anicteric. Cardiovascular: s1 s2 RRT  Lungs: Coarse Abdomen: BS x 4, soft, NT/ND.  Musculoskeletal: No gross deformities, no edema.  Skin: Intact, warm, small arc traumatic lesion,  appears to be scratches  BMET  Recent Labs Lab 06/10/15 0246 06/11/15 0555 06/12/15 0331  NA 135 138 141  K 4.1 3.5 3.3*  CL 98* 98* 104  CO2 '25 24 24  '$ BUN 14 27* 40*  CREATININE 0.79 0.95 1.03  GLUCOSE 158* 168* 164*   Electrolytes  Recent Labs Lab 06/08/15 0548 06/09/15 0307 06/10/15 0246 06/11/15 0555 06/12/15 0331  CALCIUM 8.0* 8.3* 8.7* 8.8* 8.3*  MG 1.7 1.8 2.0 1.9  --   PHOS 2.5 4.2  --  3.7  --    CBC  Recent Labs Lab 06/09/15 0307 06/10/15 0246 06/11/15 0555  WBC 4.8 7.5 19.4*  HGB 11.8* 11.2* 11.8*  HCT 34.2* 33.4* 33.4*  PLT 132* 196 185   Coag's No results for input(s): APTT, INR in the last 168 hours. Sepsis Markers  Recent Labs Lab 06/10/15 1032 06/11/15 0555 06/12/15 0331  PROCALCITON 0.80 1.51 3.36   ABG  Recent Labs Lab 06/08/15 0437  06/09/15 0500 06/10/15 0421  PHART 7.501* 7.510* 7.430  PCO2ART 35.9 40.1 45.7*  PO2ART 87.1 137* 110.0*   Liver Enzymes  Recent Labs Lab 06/10/15 0246  AST 140*  ALT 82*  ALKPHOS 82  BILITOT 0.4  ALBUMIN 2.6*   Cardiac Enzymes No results for input(s): TROPONINI, PROBNP in the last 168 hours. Glucose  Recent Labs Lab 06/11/15 0835 06/11/15 1252 06/11/15 1927 06/11/15 2333 06/12/15 0330 06/12/15 0720  GLUCAP 162* 148* 154* 133* 155* 157*   Imaging Dg Chest Port 1 View  06/12/2015  CLINICAL DATA:  Respiratory failure. EXAM:  PORTABLE CHEST 1 VIEW COMPARISON:  06/11/2015. FINDINGS: Endotracheal tube and NG tube in stable position. Heart size stable. Progressive bilateral pulmonary infiltrates. Small left pleural effusion. No pneumothorax. IMPRESSION: 1. Lines and tubes in stable position. 2. Progressive bilateral pulmonary infiltrates and/or edema. Small left pleural effusion. Electronically Signed   By: Marcello Moores  Register   On: 06/12/2015 07:33     CULTURES: BCx2 2/27 , 2/28 >ng 3/2 resp >>> strep PNA    LINES/TUBES: ETT 2/27>>>  ASSESSMENT / PLAN:  NEUROLOGIC A:  Acute encephalopathy EEG neg Seizures  Imaging - chronic old BL infarcts + acute left BG lacune Hx ETOH and polysubstance abuse (crack cocaine). P:   fentanyl.gtt RASS goal: 0  Keppra incresed by Neuro Further recommendations per neurology. Dc'd  dantrolene  Thiamine/folate/MVI. Temp control   PULMONARY A: Acute hypoxemic respiratory failure secondary to seizures, resp alk Concern PNA -resolving infx Neurogenic breathing Acute pulm edema P:   SBTs as tolerated but hold off extubation until mental status improves Diurese - since high volume with cardene  CARDIOVASCULAR A:  H/o HTN. cocaine use Echo 2/28 nml LV fn P:  cardene gtt taper Add labetalol - should be safe now PRN Hydralazine for SBP>150 Used calcium channel if needed  RENAL A: Mild rhabdo  Improved renal FXN Hypokalemia P:   replete lytes as needed resume lasix 40 q 12  GASTROINTESTINAL A:   ETOH abuse. GI prophylaxis. Nutrition. P:   Continue protonix. ct TF  per nutrition.  HEMATOLOGIC A:   VTE prophylaxis. P:  Continue SCD's/heparin. Limit phebotomy   INFECTIOUS A:   Concern for meningitis  Concern PNA R/o hsv P:   LP 3/1>>>negative Vancomycin 3/1>>>3/2 Rocephin 3/1>>>3/2>>> Amp 3/1>>>3/2 Acycolovir 3/1>>>3/2  Sputum with pneumococcus.  Rocephin restarted 3/2. Treat 8 days Fever from neuro? Pct rising  ENDOCRINE A:   No acute  issues.   P:   No interventions required.  Summary - Cause of encephalopathy not clear but likely metabolic High fever unlikely to be malignant hyperthermia, now off dantrolene Desatn related to pulm edema vs HCAP - will diurese  The patient is critically ill with multiple organ systems failure and requires high complexity decision making for assessment and support, frequent evaluation and titration of therapies, application of advanced monitoring technologies and extensive interpretation of multiple databases. Critical Care Time devoted to patient care services described in this note independent of APP time is 35 minutes.   Kara Mead MD. Shade Flood. [facility] Pager [phone] If no response call [phone]

## 2015-06-12 NOTE — Progress Notes (Signed)
Pt remains sedated and on ventilator; he is tachycardic and febrile with respiratory rates up into the 40s.  Family meeting planned for tomorrow 06/13/15 to discuss goals of care with physician.  Will follow progress.    Quintella BatonJulie W. Virgal Warmuth, RN, BSN  Trauma/Neuro ICU Case Manager [phone]

## 2015-06-12 NOTE — Progress Notes (Signed)
Patient's O2 sat dropped to 88. RT bag/lavaged patient to try to increase O2 sat. RT was able to pull out a moderate amount of secretions but had to leave patients FIO2 at 60%. RT will continue to monitor.

## 2015-06-12 NOTE — Progress Notes (Signed)
Nutrition Follow-up  DOCUMENTATION CODES:   Severe malnutrition in context of chronic illness  INTERVENTION:   Increase Vital AF 1.2 to goal of 70 ml/hr Provides: 2016 kcal (97% of needs), 126 grams protein, and 1362 ml H2O.   NUTRITION DIAGNOSIS:   Malnutrition related to chronic illness as evidenced by severe depletion of body fat, severe depletion of muscle mass. Ongoing.   GOAL:   Patient will meet greater than or equal to 90% of their needs Progressing.   MONITOR:   TF tolerance, I & O's, Vent status, Labs  ASSESSMENT:   Pt with etoh and drug abuse history presenting with seizures and acute CVA.  Patient is currently intubated on ventilator support MV: 14.2 L/min Temp (24hrs), Avg:101.1 F (38.4 C), Min:99.9 F (37.7 C), Max:102.7 F (39.3 C) Pt febrile and with increased MV.   Medications reviewed and include: folic acid, MVI, thiamine, KCl Labs reviewed: potassium low 3.3 CBG's: 133-157  OG tube: Vital AF 1.2 @ 55  Diet Order:  Diet NPO time specified  Skin:  Reviewed, no issues  Last BM:  3/6 360 ml via rectal tube   Height:   Ht Readings from Last 1 Encounters:  06/04/15 5\' 4"  (1.626 m)    Weight:   Wt Readings from Last 1 Encounters:  06/12/15 136 lb 11 oz (62 kg)    Ideal Body Weight:  59 kg  BMI:  Body mass index is 23.45 kg/(m^2).  Estimated Nutritional Needs:   Kcal:  2078  Protein:  95-120 grams  Fluid:  > 1.6 L/day  EDUCATION NEEDS:   No education needs identified at this time  Kendell BaneHeather Kaleem Sartwell RD, LDN, CNSC [phone] Pager [phone] After Hours Pager

## 2015-06-13 ENCOUNTER — Inpatient Hospital Stay (HOSPITAL_COMMUNITY): Payer: Medicare Other

## 2015-06-13 LAB — GLUCOSE, CAPILLARY
Glucose-Capillary: 128 mg/dL — ABNORMAL HIGH (ref 65–99)
Glucose-Capillary: 149 mg/dL — ABNORMAL HIGH (ref 65–99)
Glucose-Capillary: 162 mg/dL — ABNORMAL HIGH (ref 65–99)
Glucose-Capillary: 143 mg/dL — ABNORMAL HIGH (ref 65–99)
Glucose-Capillary: 153 mg/dL — ABNORMAL HIGH (ref 65–99)

## 2015-06-13 LAB — COMPREHENSIVE METABOLIC PANEL
ALBUMIN: 2.1 g/dL — AB (ref 3.5–5.0)
ALT: 62 U/L (ref 17–63)
ANION GAP: 14 (ref 5–15)
AST: 267 U/L — AB (ref 15–41)
Alkaline Phosphatase: 58 U/L (ref 38–126)
BILIRUBIN TOTAL: 0.4 mg/dL (ref 0.3–1.2)
BUN: 47 mg/dL — ABNORMAL HIGH (ref 6–20)
CHLORIDE: 106 mmol/L (ref 101–111)
CO2: 24 mmol/L (ref 22–32)
Calcium: 8.5 mg/dL — ABNORMAL LOW (ref 8.9–10.3)
Creatinine, Ser: 1.05 mg/dL (ref 0.61–1.24)
GFR calc Af Amer: >60 mL/min (ref >60)
GLUCOSE: 166 mg/dL — AB (ref 65–99)
POTASSIUM: 4 mmol/L (ref 3.5–5.1)
Sodium: 144 mmol/L (ref 135–145)
Total Protein: 6.1 g/dL — ABNORMAL LOW (ref 6.5–8.1)

## 2015-06-13 LAB — CBC
HEMATOCRIT: 30.8 % — AB (ref 39.0–52.0)
HEMOGLOBIN: 10.4 g/dL — AB (ref 13.0–17.0)
MCH: 30 pg (ref 26.0–34.0)
MCHC: 33.8 g/dL (ref 30.0–36.0)
MCV: 88.8 fL (ref 78.0–100.0)
Platelets: 331 10*3/uL (ref 150–400)
RBC: 3.47 MIL/uL — ABNORMAL LOW (ref 4.22–5.81)
RDW: 14.9 % (ref 11.5–15.5)
WBC: 18 10*3/uL — AB (ref 4.0–10.5)

## 2015-06-13 MED ORDER — FENTANYL CITRATE (PF) 100 MCG/2ML IJ SOLN: 200.0000 ug | Freq: Once | INTRAMUSCULAR | Status: DC

## 2015-06-13 MED ORDER — PROPOFOL 500 MG/50ML IV EMUL: 5.0000 ug/kg/min | Freq: Once | INTRAVENOUS | Status: DC

## 2015-06-13 MED ORDER — MIDAZOLAM HCL 2 MG/2ML IJ SOLN
4.0000 mg | Freq: Once | INTRAMUSCULAR | Status: AC
  Administered 2015-06-15: 2 mg via INTRAVENOUS
  Filled 2015-06-13: qty 4

## 2015-06-13 MED ORDER — VECURONIUM BROMIDE 10 MG IV SOLR
10.0000 mg | Freq: Once | INTRAVENOUS | Status: AC
  Administered 2015-06-15: 10 mg via INTRAVENOUS
  Filled 2015-06-13: qty 10

## 2015-06-13 MED ORDER — DILTIAZEM 12 MG/ML ORAL SUSPENSION
30.0000 mg | Freq: Four times a day (QID) | ORAL | Status: DC
  Administered 2015-06-13 – 2015-06-14 (×5): 30 mg
  Filled 2015-06-13 (×6): qty 3

## 2015-06-13 MED ORDER — ETOMIDATE 2 MG/ML IV SOLN
40.0000 mg | Freq: Once | INTRAVENOUS | Status: AC
  Administered 2015-06-15: 20 mg via INTRAVENOUS
  Filled 2015-06-13: qty 20

## 2015-06-13 MED ORDER — FUROSEMIDE 10 MG/ML IJ SOLN
40.0000 mg | Freq: Every day | INTRAMUSCULAR | Status: DC
  Administered 2015-06-14 – 2015-06-15 (×2): 40 mg via INTRAVENOUS
  Filled 2015-06-13 (×2): qty 4

## 2015-06-13 NOTE — Progress Notes (Signed)
Sputum sample obtained and sent to lab.  

## 2015-06-13 NOTE — Care Management Note (Signed)
Case Management Note  Patient Details  Name: Carlisle CaterClay Heide MRN: [id number] Date of Birth: [birth year]  Subjective/Objective:  Family meeting this AM with MD; family wishes to pursue trach placement, possibly tomorrow.                    Action/Plan: CSW consulted to follow post procedure; will likely need SNF or vent SNF upon dc.    Expected Discharge Date:                  Expected Discharge Plan:  Skilled Nursing Facility  In-House Referral:  Clinical Social Work  Discharge planning Services  CM Consult  Post Acute Care Choice:    Choice offered to:     DME Arranged:    DME Agency:     HH Arranged:    HH Agency:     Status of Service:  In process, will continue to follow  Medicare Important Message Given:    Date Medicare IM Given:    Medicare IM give by:    Date Additional Medicare IM Given:    Additional Medicare Important Message give by:     If discussed at Long Length of Stay Meetings, dates discussed:    Additional Comments:  Glennon Macmerson, Brittinie Wherley M, RN 06/13/2015, 2:43 PM

## 2015-06-13 NOTE — Progress Notes (Addendum)
PULMONARY / CRITICAL CARE MEDICINE   Name: Gregory Parker MRN: [id number] DOB: Mar 25, 1956    ADMISSION DATE:  06/04/2015 CONSULTATION DATE:  06/04/2015  REFERRING MD:  EDP at [place]:  AMS  HISTORY OF PRESENT ILLNESS:  Patient is encephalopathic, therefore; HPI obtained from chart review. 60 year old male with PMH  significant for HTN, ETOH, and CVA. 2/27 EMS was dispatched to the patient's home due to reports of shaking episodes with decreased responsiveness. He has a history of ETOH abuse  Family also reports that he may have smoked crack over the weekend. He was last seen normal 430 PM. In the emergency department, staff noted urinary incontinence, leftward gaze deviation, R facial droop, and posturing on the L side. He then proceeded to have seizure like activity with limited airway protection, and thus was intubated by EDP. He had a CT of the head which showed, no acute abnormality, but remote basal ganglia infarcts. He was loaded with 1G IV Keppra and started on propofol for sedation/seizure management. He was transferred to [facility] for ICU admission and neurology evaluation.   SIGNIFICANT EVENTS: 2/27 AMS, ? Seizure, intubated in [facility] ED, tx to [facility] ICU 3/5 cardene gtt 3/5 dantrolene stopped  STUDIES:  CT head 2/27 >  No acute intracranial abnormality. Remote bilateral basal ganglia lacunar infarcts. Apparent soft tissue fullness in the nasopharynx could relate to retained secretions, especially given absence of mastoid effusions.  MRI brain 3/3>>>acute left basal ganglia lacunar EEG 3/6 nml   SUBJECTIVE: Remains febrile - Tm 102 - Off cardene gtt   remains tachy, unresponsive desatn with tachypnea  VITAL SIGNS: BP 156/82 mmHg  Pulse 130  Temp(Src) 101.5 F (38.6 C) (Core (Comment))  Resp 18  Ht _0  (1.626 m)  Wt 135 lb 12.9 oz (61.6 kg)  BMI 23.30 kg/m2  SpO2 93%  HEMODYNAMICS:    VENTILATOR SETTINGS: Vent Mode:  [-] PRVC FiO2 (%):  [40 %-60  %] 60 % Set Rate:  [14 bmp] 14 bmp Vt Set:  [420 mL] 420 mL PEEP:  [5 cmH20] 5 cmH20  INTAKE / OUTPUT: I/O last 3 completed shifts: In: 4201.2 [I.V.:1630.9; NG/GT:2420.3; IV Piggyback:150] Out: 2865 [Urine:2615; Stool:250]  PHYSICAL EXAMINATION: General: AA male, in NAD. Neuro: rass -3, unresponsive, flexion postures on DPS HEENT: [place]/AT. PERRL, sclerae anicteric, massive tongue swelling Cardiovascular: s1 s2 RRT  Lungs: Coarse Abdomen: BS x 4, soft, NT/ND.  Musculoskeletal: No gross deformities, no edema.  Skin: Intact, warm, small arc traumatic lesion,  appears to be scratches  BMET  Recent Labs Lab 06/11/15 0555 06/12/15 0331 06/13/15 0300  NA 138 141 144  K 3.5 3.3* 4.0  CL 98* 104 106  CO2 _1 BUN 27* 40* 47*  CREATININE 0.95 1.03 1.05  GLUCOSE 168* 164* 166*   Electrolytes  Recent Labs Lab 06/08/15 0548 06/09/15 0307 06/10/15 0246 06/11/15 0555 06/12/15 0331 06/13/15 0300  CALCIUM 8.0* 8.3* 8.7* 8.8* 8.3* 8.5*  MG 1.7 1.8 2.0 1.9  --   --   PHOS 2.5 4.2  --  3.7  --   --    CBC  Recent Labs Lab 06/10/15 0246 06/11/15 0555 06/13/15 0300  WBC 7.5 19.4* 18.0*  HGB 11.2* 11.8* 10.4*  HCT 33.4* 33.4* 30.8*  PLT 196 185 331   Coag's No results for input(s): APTT, INR in the last 168 hours. Sepsis Markers  Recent Labs Lab 06/10/15 1032 06/11/15 0555 06/12/15 0331  PROCALCITON 0.80  1.51 3.36   ABG  Recent Labs Lab 06/08/15 0437 06/09/15 0500 06/10/15 0421  PHART 7.501* 7.510* 7.430  PCO2ART 35.9 40.1 45.7*  PO2ART 87.1 137* 110.0*   Liver Enzymes  Recent Labs Lab 06/10/15 0246 06/13/15 0300  AST 140* 267*  ALT 82* 62  ALKPHOS 82 58  BILITOT 0.4 0.4  ALBUMIN 2.6* 2.1*   Cardiac Enzymes No results for input(s): TROPONINI, PROBNP in the last 168 hours. Glucose  Recent Labs Lab 06/12/15 0720 06/12/15 1142 06/12/15 1511 06/12/15 1923 06/12/15 2324 06/13/15 0353  GLUCAP 157* 157* 142* 133* 154* 128*    Imaging Dg Chest Port 1 View  06/13/2015  CLINICAL DATA:  Acute respiratory failure EXAM: PORTABLE CHEST 1 VIEW COMPARISON:  06/12/2015 FINDINGS: Support devices are stable. Moderate to severe diffuse bilateral airspace disease again noted, slightly worsened in the right upper lobe since prior study. No effusions. Heart is borderline in size. IMPRESSION: Severe bilateral airspace disease, worsening since prior study, particularly in the right upper lobe. Electronically Signed   By: Rolm Baptise M.D.   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 P:   fentanyl.gtt RASS goal: 0  Keppra per Neuro Dc'd  dantrolene  Thiamine/folate/MVI. Temp control   PULMONARY A: Acute hypoxemic respiratory failure secondary to seizures, resp alk Concern HCAP -worseinfx Neurogenic breathing  P:  Add PEEP 8-10 to ower FIO2 Diurese - since high volume with cardene Early trach here - if family wishes to push forward  CARDIOVASCULAR A:  H/o HTN. cocaine use Echo 2/28 nml LV fn P:  cardene gtt off Start cardizem 30 q 6h & titrate Add labetalol - should be safe now PRN Hydralazine for SBP>150   RENAL A: Mild rhabdo  Improved renal FXN Hypokalemia P:   replete lytes as needed ct lasix 40 q 24  GASTROINTESTINAL A:   ETOH abuse. GI prophylaxis. Nutrition. P:   Continue protonix. ct TF  per nutrition.  HEMATOLOGIC A:   VTE prophylaxis. P:  Continue SCD's/heparin.   INFECTIOUS A:   Concern for meningitis  Concern PNA R/o hsv P:   LP 3/1>>>negative Vancomycin 3/1>>>3/2 Rocephin 3/1>>>3/2>>> Amp 3/1>>>3/2 Acycolovir 3/1>>>3/2  Sputum with pneumococcus.  Rocephin restarted 3/2. Treat 8 days Fever from neuro? Pct rising -  rpt cx  ENDOCRINE A:   No acute issues.   P:    Family meeting 3/8 for goals of care  Summary - Cause of encephalopathy not clear but likely metabolic High fever unlikely to be malignant hyperthermia, now off dantrolene Desatn related to HCAP   The patient is critically ill with multiple organ systems failure and requires high complexity decision making for assessment and support, frequent evaluation and titration of therapies, application of advanced monitoring technologies and extensive interpretation of multiple databases. Critical Care Time devoted to patient care services described in this note independent of APP time is 35 minutes.   Kara Mead MD. Shade Flood. [place] [facility] Pager [phone] If no response call [phone]

## 2015-06-14 LAB — GLUCOSE, CAPILLARY
GLUCOSE-CAPILLARY: 136 mg/dL — AB (ref 65–99)
GLUCOSE-CAPILLARY: 107 mg/dL — AB (ref 65–99)
Glucose-Capillary: 104 mg/dL — ABNORMAL HIGH (ref 65–99)
Glucose-Capillary: 112 mg/dL — ABNORMAL HIGH (ref 65–99)
Glucose-Capillary: 114 mg/dL — ABNORMAL HIGH (ref 65–99)

## 2015-06-14 LAB — APTT: APTT: 33 s (ref 24–37)

## 2015-06-14 LAB — BASIC METABOLIC PANEL
Anion gap: 19 — ABNORMAL HIGH (ref 5–15)
BUN: 48 mg/dL — ABNORMAL HIGH (ref 6–20)
CALCIUM: 8.9 mg/dL (ref 8.9–10.3)
CO2: 26 mmol/L (ref 22–32)
CREATININE: 1.12 mg/dL (ref 0.61–1.24)
Chloride: 103 mmol/L (ref 101–111)
GFR calc non Af Amer: >60 mL/min (ref >60)
Glucose, Bld: 119 mg/dL — ABNORMAL HIGH (ref 65–99)
Potassium: 4 mmol/L (ref 3.5–5.1)
SODIUM: 148 mmol/L — AB (ref 135–145)

## 2015-06-14 LAB — CBC
HCT: 26.8 % — ABNORMAL LOW (ref 39.0–52.0)
Hemoglobin: 9.2 g/dL — ABNORMAL LOW (ref 13.0–17.0)
MCH: 31.5 pg (ref 26.0–34.0)
MCHC: 34.3 g/dL (ref 30.0–36.0)
MCV: 91.8 fL (ref 78.0–100.0)
PLATELETS: 401 10*3/uL — AB (ref 150–400)
RBC: 2.92 MIL/uL — AB (ref 4.22–5.81)
RDW: 15.4 % (ref 11.5–15.5)
WBC: 17.2 10*3/uL — AB (ref 4.0–10.5)

## 2015-06-14 LAB — PROTIME-INR
INR: 1.13 (ref 0.00–1.49)
PROTHROMBIN TIME: 14.7 s (ref 11.6–15.2)

## 2015-06-14 MED ORDER — FREE WATER
200.0000 mL | Status: DC
Start: 2015-06-14 — End: 2015-06-26
  Administered 2015-06-14 – 2015-06-25 (×63): 200 mL

## 2015-06-14 MED ORDER — DILTIAZEM 12 MG/ML ORAL SUSPENSION
60.0000 mg | Freq: Four times a day (QID) | ORAL | Status: DC
  Administered 2015-06-14 – 2015-07-12 (×109): 60 mg
  Filled 2015-06-14 (×117): qty 6

## 2015-06-14 NOTE — Progress Notes (Signed)
PULMONARY / CRITICAL CARE MEDICINE   Name: Gregory Parker MRN: [id number] DOB: 1956-02-21    ADMISSION DATE:  06/04/2015 CONSULTATION DATE:  06/04/2015  REFERRING MD:  EDP at [place]:  AMS  HISTORY OF PRESENT ILLNESS:  Patient is encephalopathic, therefore; HPI obtained from chart review. 60 year old male with PMH  significant for HTN, ETOH, and CVA. 2/27 EMS was dispatched to the patient's home due to reports of shaking episodes with decreased responsiveness. He has a history of ETOH abuse  Family also reports that he may have smoked crack over the weekend. He was last seen normal 430 PM. In the emergency department, staff noted urinary incontinence, leftward gaze deviation, R facial droop, and posturing on the L side. He then proceeded to have seizure like activity with limited airway protection, and thus was intubated by EDP. He had a CT of the head which showed, no acute abnormality, but remote basal ganglia infarcts. He was loaded with 1G IV Keppra and started on propofol for sedation/seizure management. He was transferred to [facility] for ICU admission and neurology evaluation.   SIGNIFICANT EVENTS: 2/27 AMS, ? Seizure, intubated in [facility], tx to [facility] ICU 3/5 cardene gtt 3/5 dantrolene stopped  STUDIES:  CT head 2/27 >  No acute intracranial abnormality. Remote bilateral basal ganglia lacunar infarcts. Apparent soft tissue fullness in the nasopharynx could relate to retained secretions, especially given absence of mastoid effusions.  MRI brain 3/3>>>acute left basal ganglia lacunar EEG 3/6 nml   SUBJECTIVE: Remains febrile   Poorly responsive satn low  VITAL SIGNS: BP 155/82 mmHg  Pulse 97  Temp(Src) 98.7 F (37.1 C) (Axillary)  Resp 21  Ht _0  (1.626 m)  Wt 134 lb 14.7 oz (61.2 kg)  BMI 23.15 kg/m2  SpO2 100%  HEMODYNAMICS:    VENTILATOR SETTINGS: Vent Mode:  [-] PRVC FiO2 (%):  [50 %-60 %] 50 % Set Rate:  [14 bmp] 14 bmp Vt Set:  [420 mL] 420  mL PEEP:  [10 cmH20-12 cmH20] 12 cmH20 Plateau Pressure:  [17 cmH20-25 cmH20] 21 cmH20  INTAKE / OUTPUT: I/O last 3 completed shifts: In: 2864.6 [I.V.:534.6; NG/GT:2180; IV Piggyback:150] Out: 2480 [Urine:2280; Stool:200]  PHYSICAL EXAMINATION: General: AA male, in NAD. Neuro: rass -2, unresponsive, flexion postures & eyes open on DPS HEENT: [place]/AT. PERRL, sclerae anicteric, massive tongue swelling Cardiovascular: s1 s2 RRT  Lungs: Coarse Abdomen: BS x 4, soft, NT/ND.  Musculoskeletal: No gross deformities, no edema.  Skin: Intact, warm, small arc traumatic lesion,  appears to be scratches  BMET  Recent Labs Lab 06/12/15 0331 06/13/15 0300 06/14/15 0351  NA 141 144 148*  K 3.3* 4.0 4.0  CL 104 106 103  CO2 _1 BUN 40* 47* 48*  CREATININE 1.03 1.05 1.12  GLUCOSE 164* 166* 119*   Electrolytes  Recent Labs Lab 06/08/15 0548 06/09/15 0307 06/10/15 0246 06/11/15 0555 06/12/15 0331 06/13/15 0300 06/14/15 0351  CALCIUM 8.0* 8.3* 8.7* 8.8* 8.3* 8.5* 8.9  MG 1.7 1.8 2.0 1.9  --   --   --   PHOS 2.5 4.2  --  3.7  --   --   --    CBC  Recent Labs Lab 06/11/15 0555 06/13/15 0300 06/14/15 0351  WBC 19.4* 18.0* 17.2*  HGB 11.8* 10.4* 9.2*  HCT 33.4* 30.8* 26.8*  PLT 185 331 401*   Coag's  Recent Labs Lab 06/14/15 0351  APTT 33  INR 1.13   Sepsis  Markers  Recent Labs Lab 06/10/15 1032 06/11/15 0555 06/12/15 0331  PROCALCITON 0.80 1.51 3.36   ABG  Recent Labs Lab 06/08/15 0437 06/09/15 0500 06/10/15 0421  PHART 7.501* 7.510* 7.430  PCO2ART 35.9 40.1 45.7*  PO2ART 87.1 137* 110.0*   Liver Enzymes  Recent Labs Lab 06/10/15 0246 06/13/15 0300  AST 140* 267*  ALT 82* 62  ALKPHOS 82 58  BILITOT 0.4 0.4  ALBUMIN 2.6* 2.1*   Cardiac Enzymes No results for input(s): TROPONINI, PROBNP in the last 168 hours. Glucose  Recent Labs Lab 06/13/15 1221 06/13/15 1615 06/13/15 1919 06/13/15 2306 06/14/15 0325 06/14/15 1152  GLUCAP  162* 143* 153* 136* 104* 107*   Imaging No results found.   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 P:   Fentanyl.gtt, low dose propofol ok RASS goal: 0  Keppra per Neuro Dc'd  dantrolene  Thiamine/folate/MVI. Temp control with tylenol & cooling blanket  PULMONARY A: Acute hypoxemic respiratory failure secondary to seizures, resp alk Concern HCAP -worseinfx Neurogenic breathing  P:  Increase PEEP 12 to ower FIO2 Plan for trach once FIo2 lower  CARDIOVASCULAR A:  H/o HTN. cocaine use Echo 2/28 nml LV fn P:  Increaset cardizem 60 q 6h  Added labetalol - should be safe now PRN Hydralazine for SBP>150   RENAL A: Mild rhabdo  Improved renal FXN Hypokalemia hypernatremia P:   replete lytes as needed ct lasix 40 q 24 Add free water  GASTROINTESTINAL A:   ETOH abuse. GI prophylaxis. Nutrition. P:   Continue protonix. ct TF  per nutrition.  HEMATOLOGIC A:   VTE prophylaxis. P:  Continue SCD's/heparin.   INFECTIOUS A:   Concern for meningitis  Concern PNA R/o hsv P:   LP 3/1>>>negative Vancomycin 3/1>>>3/2 Rocephin 3/1>>>3/2>>> Amp 3/1>>>3/2 Acycolovir 3/1>>>3/2  Sputum with pneumococcus.  Rocephin restarted 3/2. Treat 8 days Fever from neuro? Rpt pan cx results pending Pct rising - rpt cx  ENDOCRINE A:   No acute issues.   P:    Family meeting 3/8 - discussed  goals of care, since neuro prognosis not clear, brothers ok with proceeding with trach, but they admit he would not want long term care if neuro prognosis dismal  Summary - Cause of encephalopathy not clear but likely metabolic High fever unlikely to be malignant hyperthermia,  off dantrolene Desatn related to HCAP, titrating PEEP  The patient is  critically ill with multiple organ systems failure and requires high complexity decision making for assessment and support, frequent evaluation and titration of therapies, application of advanced monitoring technologies and extensive interpretation of multiple databases. Critical Care Time devoted to patient care services described in this note independent of APP time is 35 minutes.   Kara Mead MD. Shade Flood. [place] [facility] Pager [phone] If no response call [phone]

## 2015-06-14 NOTE — Progress Notes (Signed)
CCM is going to hold off on placing Trach today d/t pt continuing to desat on the vent. Will re-evaluate trach placement tomorrow. Called and left a message with the pt brother, Dorinda HillDonald, to let him know.   Cydni Reddoch GARNER

## 2015-06-14 NOTE — Clinical Documentation Improvement (Signed)
Neurology Critical Care  Nutritional consult note from 06/12/15, please provide a diagnosis if appropriate for this admission?  Thank you    Document Severity - Severe(third degree), Moderate (second degree), Mild (first degree)  Form - [place] (rarely seen in the [place], Other Condition, Unable to Determine  Other condition  Unable to clinically determine  Document any associated diagnoses/conditions   Supporting Information: :  06/12/15  "Severe malnutrition in context of chronic illness"      Please exercise your independent, professional judgment when responding. A specific answer is not anticipated or expected.   Thank You, Lavonda JumboLawanda J Romulus [facility] [employer] [place] [phone]

## 2015-06-15 ENCOUNTER — Inpatient Hospital Stay (HOSPITAL_COMMUNITY): Payer: Medicare Other

## 2015-06-15 DIAGNOSIS — J9601 Acute respiratory failure with hypoxia: Secondary | ICD-10-CM | POA: Diagnosis present

## 2015-06-15 LAB — GLUCOSE, CAPILLARY
GLUCOSE-CAPILLARY: 103 mg/dL — AB (ref 65–99)
GLUCOSE-CAPILLARY: 119 mg/dL — AB (ref 65–99)
GLUCOSE-CAPILLARY: 160 mg/dL — AB (ref 65–99)
Glucose-Capillary: 112 mg/dL — ABNORMAL HIGH (ref 65–99)
Glucose-Capillary: 134 mg/dL — ABNORMAL HIGH (ref 65–99)
Glucose-Capillary: 115 mg/dL — ABNORMAL HIGH (ref 65–99)
Glucose-Capillary: 116 mg/dL — ABNORMAL HIGH (ref 65–99)

## 2015-06-15 LAB — CULTURE, RESPIRATORY

## 2015-06-15 LAB — BASIC METABOLIC PANEL
ANION GAP: 11 (ref 5–15)
BUN: 50 mg/dL — AB (ref 6–20)
CALCIUM: 8.8 mg/dL — AB (ref 8.9–10.3)
CO2: 28 mmol/L (ref 22–32)
Chloride: 108 mmol/L (ref 101–111)
Creatinine, Ser: 1.19 mg/dL (ref 0.61–1.24)
GFR calc Af Amer: >60 mL/min (ref >60)
GLUCOSE: 124 mg/dL — AB (ref 65–99)
Potassium: 4.1 mmol/L (ref 3.5–5.1)
SODIUM: 147 mmol/L — AB (ref 135–145)

## 2015-06-15 LAB — CULTURE, RESPIRATORY W GRAM STAIN: Culture: NORMAL

## 2015-06-15 LAB — CBC
HCT: 27.2 % — ABNORMAL LOW (ref 39.0–52.0)
Hemoglobin: 8.9 g/dL — ABNORMAL LOW (ref 13.0–17.0)
MCH: 29.9 pg (ref 26.0–34.0)
MCHC: 32.7 g/dL (ref 30.0–36.0)
MCV: 91.3 fL (ref 78.0–100.0)
PLATELETS: 510 10*3/uL — AB (ref 150–400)
RBC: 2.98 MIL/uL — AB (ref 4.22–5.81)
RDW: 15.3 % (ref 11.5–15.5)
WBC: 15.2 10*3/uL — AB (ref 4.0–10.5)

## 2015-06-15 LAB — PROCALCITONIN: Procalcitonin: 1.3 ng/mL

## 2015-06-15 NOTE — Progress Notes (Signed)
PULMONARY / CRITICAL CARE MEDICINE   Name: Gregory Parker MRN: [id number] DOB: 1955/09/23    ADMISSION DATE:  06/04/2015 CONSULTATION DATE:  06/04/2015  REFERRING MD:  EDP at [place]:  AMS  HISTORY OF PRESENT ILLNESS:  Patient is encephalopathic, therefore; HPI obtained from chart review. 60 year old male with PMH  significant for HTN, ETOH, and CVA. 2/27 EMS was dispatched to the patient's home due to reports of shaking episodes with decreased responsiveness. He has a history of ETOH abuse  Family also reports that he may have smoked crack over the weekend. He was last seen normal 430 PM. In the emergency department, staff noted urinary incontinence, leftward gaze deviation, R facial droop, and posturing on the L side. He then proceeded to have seizure like activity with limited airway protection, and thus was intubated by EDP. He had a CT of the head which showed, no acute abnormality, but remote basal ganglia infarcts. He was loaded with 1G IV Keppra and started on propofol for sedation/seizure management. He was transferred to [facility] for ICU admission and neurology evaluation.   SIGNIFICANT EVENTS: 2/27 AMS, ? Seizure, intubated in [facility], tx to [facility] ICU 3/5 cardene gtt 3/5 dantrolene stopped  STUDIES:  CT head 2/27 >  No acute intracranial abnormality. Remote bilateral basal ganglia lacunar infarcts. Apparent soft tissue fullness in the nasopharynx could relate to retained secretions, especially given absence of mastoid effusions.  MRI brain 3/3>>>acute left basal ganglia lacunar EEG 3/6 nml   SUBJECTIVE: defervesced remains Poorly responsive FIO2 lower  VITAL SIGNS: BP 164/74 mmHg  Pulse 89  Temp(Src) 98.9 F (37.2 C) (Oral)  Resp 22  Ht 5' 4" (1.626 m)  Wt 135 lb 5.8 oz (61.4 kg)  BMI 23.22 kg/m2  SpO2 100%  HEMODYNAMICS:    VENTILATOR SETTINGS: Vent Mode:  [-] PRVC FiO2 (%):  [40 %-70 %] 40 % Set Rate:  [14 bmp] 14 bmp Vt Set:  [420 mL] 420  mL PEEP:  [12 cmH20] 12 cmH20 Plateau Pressure:  [18 cmH20-34 cmH20] 20 cmH20  INTAKE / OUTPUT: I/O last 3 completed shifts: In: 1920.2 [I.V.:540; NG/GT:1230.2; IV Piggyback:150] Out: 2240 [Urine:1085; Stool:100]  PHYSICAL EXAMINATION: General: AA male, in NAD. Neuro: rass -2, unresponsive, flexion postures & eyes open on DPS HEENT: [place]/AT. PERRL, sclerae anicteric, massive tongue swelling Cardiovascular: s1 s2 RRT  Lungs: Coarse Abdomen: BS x 4, soft, NT/ND.  Musculoskeletal: No gross deformities, no edema.  Skin: Intact, warm, small arc traumatic lesion,  appears to be scratches  BMET  Recent Labs Lab 06/13/15 0300 06/14/15 0351 06/15/15 0450  NA 144 148* 147*  K 4.0 4.0 4.1  CL 106 103 108  CO2 _0 BUN 47* 48* 50*  CREATININE 1.05 1.12 1.19  GLUCOSE 166* 119* 124*   Electrolytes  Recent Labs Lab 06/09/15 0307 06/10/15 0246 06/11/15 0555  06/13/15 0300 06/14/15 0351 06/15/15 0450  CALCIUM 8.3* 8.7* 8.8*  < > 8.5* 8.9 8.8*  MG 1.8 2.0 1.9  --   --   --   --   PHOS 4.2  --  3.7  --   --   --   --   < > = values in this interval not displayed. CBC  Recent Labs Lab 06/13/15 0300 06/14/15 0351 06/15/15 0450  WBC 18.0* 17.2* 15.2*  HGB 10.4* 9.2* 8.9*  HCT 30.8* 26.8* 27.2*  PLT 331 401* 510*   Coag's  Recent Labs Lab 06/14/15  0351  APTT 33  INR 1.13   Sepsis Markers  Recent Labs Lab 06/10/15 1032 06/11/15 0555 06/12/15 0331  PROCALCITON 0.80 1.51 3.36   ABG  Recent Labs Lab 06/09/15 0500 06/10/15 0421  PHART 7.510* 7.430  PCO2ART 40.1 45.7*  PO2ART 137* 110.0*   Liver Enzymes  Recent Labs Lab 06/10/15 0246 06/13/15 0300  AST 140* 267*  ALT 82* 62  ALKPHOS 82 58  BILITOT 0.4 0.4  ALBUMIN 2.6* 2.1*   Cardiac Enzymes No results for input(s): TROPONINI, PROBNP in the last 168 hours. Glucose  Recent Labs Lab 06/14/15 1152 06/14/15 1611 06/14/15 1929 06/14/15 2333 06/15/15 0346 06/15/15 0735  GLUCAP 107* 112*  114* 160* 112* 134*   Imaging Dg Chest Port 1 View  06/15/2015  CLINICAL DATA:  Acute respiratory failure, status epilepticus, alcoholic withdrawal, acute encephalopathy with cerebral infarction. EXAM: PORTABLE CHEST 1 VIEW COMPARISON:  Portable chest x-ray of June 13, 2015 FINDINGS: The lungs are adequately inflated. There are persistent confluent alveolar opacities bilaterally. There is no pneumothorax or pleural effusion. The heart is normal in size. The pulmonary vascularity is not clearly engorged. The endotracheal tube tip lies 4.5 cm above the carina. The esophagogastric tube tip projects below the inferior margin of the image. IMPRESSION: Stable bilateral airspace opacities. No pleural effusion or pneumothorax. The support tubes are in stable position. Electronically Signed   By: David  Martinique M.D.   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 P:   Fentanyl.gtt, low dose propofol ok RASS goal: 0  Keppra per Neuro Dc'd  dantrolene  Thiamine/folate/MVI. Temp control with tylenol & cooling blanket  PULMONARY A: Acute hypoxemic respiratory failure secondary to seizures, resp alk Concern HCAP -worseinfx Neurogenic breathing  P:  Increase PEEP 12, lowered FIO2 to 40% Can proceed with trach now that  FIo2 lower  CARDIOVASCULAR A:  H/o HTN. cocaine use Echo 2/28 nml LV fn P:  Increased cardizem 60 q 6h  Added labetalol - should be safe now PRN Hydralazine for SBP>150   RENAL A: Mild rhabdo  Improved renal FXN Hypokalemia hypernatremia P:   replete lytes as needed Hold  lasix Add free water  GASTROINTESTINAL A:   ETOH abuse. GI prophylaxis. Nutrition. P:   Continue  protonix. ct TF  per nutrition.  HEMATOLOGIC A:   VTE prophylaxis. P:  Continue SCD's/heparin.   INFECTIOUS A:   Concern for meningitis  Concern PNA - aspiration vs HCAP R/o hsv P:   LP 3/1>>>negative Vancomycin 3/1>>>3/2 Rocephin 3/1>>>3/2>>> Amp 3/1>>>3/2 Acycolovir 3/1>>>3/2  Sputum with pneumococcus.  Rocephin restarted 3/2, consider stopping 3/11- 10 days if pct down 9was rising) Fever from neuro? Rpt pan cx results pending   ENDOCRINE A:   No acute issues.   P:    Family meeting 3/8 - discussed  goals of care, since neuro prognosis not clear, brothers ok with proceeding with trach, but they admit he would not want long term care if neuro prognosis dismal  Summary - Cause of encephalopathy not clear but likely metabolic High fever unlikely to be malignant hyperthermia,  off dantrolene Desatn related to HCAP, titrating PEEP  The patient is critically ill  with multiple organ systems failure and requires high complexity decision making for assessment and support, frequent evaluation and titration of therapies, application of advanced monitoring technologies and extensive interpretation of multiple databases. Critical Care Time devoted to patient care services described in this note independent of APP time is 35 minutes.   Kara Mead MD. Shade Flood. [place] [facility] Pager [phone] If no response call [phone]

## 2015-06-15 NOTE — Procedures (Signed)
Percutaneous Tracheostomy Placement  Consent from family.  Patient sedated, paralyzed and position.  Placed on 100% FiO2 and RR matched.  Area cleaned and draped.  Lidocaine/epi injected.  Skin incision done followed by blunt dissection.  Trachea palpated then punctured, catheter passed and visualized bronchoscopically.  Wire placed and visualized.  Catheter removed.  Airway then crushed and dilated.  Size 6 cuffed shiley trach placed and visualized bronchoscopically well above carina.  Good volume returns.  Patient tolerated the procedure well without complications.  Minimal blood loss.  CXR ordered and pending.  Wesam G. Yacoub, M.D. [place] [facility]. Pager: [phone]. After hours pager: [phone]. 

## 2015-06-15 NOTE — Progress Notes (Signed)
Orthopedic Tech Progress Note Patient Details:  Gregory CaterClay Parker 1955-06-17 [id number]  Ortho Devices Type of Ortho Device:  (prafo boot) Ortho Device/Splint Location: rle Ortho Device/Splint Interventions: Application   Gregory Parker 06/15/2015, 11:41 AM

## 2015-06-15 NOTE — Procedures (Signed)
Bronchoscopy Procedure Note Gregory CaterClay Parker [id number] Aug 22, 1955  Procedure: Bronchoscopy Indications: trach placement   Procedure Details Procedure done by P Parker ACNP-BC. At first bronch was introduce through ET tube and structures of tracheal rings, carina identified for operator of tracheostomy who was Dr Molli KnockYacoub. Light of bronch passed through trachea and skin for indentification of tracheal rings for tracheostomy puncture. After this, under bronchoscopy guidance,  ET tube was pulled back sufficiently and very carefully. The ET tube was  pulled back enough to give room for tracheostomy operator and yet at same time to to ensure a secured airway. After this was accomplished, bronchoscope was withdrawn into the ET tube. After this,  Dr Molli KnockYacoub then performed tracheostomy under video visual provided by flexible video bronchoscopy. Followng introduction of tracheostomy,  the bronchoscope was removed from ET tube and introduced through tracheostomy. Correct position of tracheostomy was ensured, with enough room between carina and distal tracheostomy and no evidence of bleeding. The bronchoscope was then withdrawn. Respiratory therapist was then instructed to remove the ET tube.  Dr Clois DupesYqacoub then proceeded to complete the tracheostomy with stay sutures   No complications   Gregory MartinetPeter [name] ACNP-BC [facility] Pager # [phone] OR # [phone] if no answer   Gregory Parker [name] 06/15/2015  Gregory ReedyWesam G. Parker, M.D. [facility]. Pager: [phone]. After hours pager: [phone].

## 2015-06-15 NOTE — Procedures (Signed)
Bedside Tracheostomy Insertion Procedure Note   Patient Details:   Name: Gregory Parker DOB: Jan 06, 1956 MRN: [id number]  Procedure: Tracheostomy  Pre Procedure Assessment: ET Tube Size: 7.5 ET Tube secured at lip (cm): 21 Bite block in place: No Breath Sounds: Clear  Post Procedure Assessment: BP 164/74 mmHg  Pulse 89  Temp(Src) 97.5 F (36.4 C) (Axillary)  Resp 22  Ht 5\' 4"  (1.626 m)  Wt 135 lb 5.8 oz (61.4 kg)  BMI 23.22 kg/m2  SpO2 100% O2 sats: stable throughout Complications: No apparent complications Patient did tolerate procedure well Tracheostomy Brand:Shiley Tracheostomy Style:Cuffed Tracheostomy Size: 6.0 Tracheostomy Secured NWG:NFAOZHYvia:Sutures Tracheostomy Placement Confirmation:Trach cuff visualized and in place and Chest X ray ordered for placement    Loletta SpecterSmith, Jad Johansson Sands 06/15/2015, 2:45 PM

## 2015-06-15 NOTE — Progress Notes (Signed)
eLink Physician-Brief Progress Note Patient Name: Carlisle CaterClay Diehl DOB: May 01, 1955 MRN: [id number]   Date of Service  06/15/2015  HPI/Events of Note  Evaluated KUB after NG tube placement. Tip of NG tube in distal stomach.  eICU Interventions  Okay to use & restart tube feeds.     Intervention Category Intermediate Interventions: Diagnostic test evaluation  Lawanda CousinsJennings Iziah Cates 06/15/2015, 7:58 PM

## 2015-06-16 ENCOUNTER — Inpatient Hospital Stay (HOSPITAL_COMMUNITY): Payer: Medicare Other

## 2015-06-16 LAB — BASIC METABOLIC PANEL
Anion gap: 12 (ref 5–15)
BUN: 42 mg/dL — ABNORMAL HIGH (ref 6–20)
CALCIUM: 8.9 mg/dL (ref 8.9–10.3)
CO2: 31 mmol/L (ref 22–32)
CREATININE: 1.03 mg/dL (ref 0.61–1.24)
Chloride: 108 mmol/L (ref 101–111)
Glucose, Bld: 169 mg/dL — ABNORMAL HIGH (ref 65–99)
Potassium: 3.7 mmol/L (ref 3.5–5.1)
SODIUM: 151 mmol/L — AB (ref 135–145)

## 2015-06-16 LAB — CBC
HCT: 27.4 % — ABNORMAL LOW (ref 39.0–52.0)
Hemoglobin: 8.7 g/dL — ABNORMAL LOW (ref 13.0–17.0)
MCH: 29.7 pg (ref 26.0–34.0)
MCHC: 31.8 g/dL (ref 30.0–36.0)
MCV: 93.5 fL (ref 78.0–100.0)
PLATELETS: 532 10*3/uL — AB (ref 150–400)
RBC: 2.93 MIL/uL — AB (ref 4.22–5.81)
RDW: 15.5 % (ref 11.5–15.5)
WBC: 12.8 10*3/uL — AB (ref 4.0–10.5)

## 2015-06-16 LAB — GLUCOSE, CAPILLARY
GLUCOSE-CAPILLARY: 131 mg/dL — AB (ref 65–99)
GLUCOSE-CAPILLARY: 130 mg/dL — AB (ref 65–99)
Glucose-Capillary: 147 mg/dL — ABNORMAL HIGH (ref 65–99)
Glucose-Capillary: 154 mg/dL — ABNORMAL HIGH (ref 65–99)
Glucose-Capillary: 128 mg/dL — ABNORMAL HIGH (ref 65–99)
Glucose-Capillary: 136 mg/dL — ABNORMAL HIGH (ref 65–99)

## 2015-06-16 LAB — PROCALCITONIN: PROCALCITONIN: 0.76 ng/mL

## 2015-06-16 NOTE — Progress Notes (Signed)
PULMONARY / CRITICAL CARE MEDICINE   Name: Gregory Parker MRN: [id number] DOB: [birth year]    ADMISSION DATE:  06/04/2015 CONSULTATION DATE:  06/04/2015  REFERRING MD:  EDP at [place]:  AMS  SIGNIFICANT EVENTS: 2/27 AMS, ? Seizure, intubated in [facility] ED, tx to [facility] ICU 3/5 cardene gtt 3/5 dantrolene stopped 3/10 change to pressure control  STUDIES:  CT head 2/27 >  No acute intracranial abnormality. Remote bilateral basal ganglia lacunar infarcts. Apparent soft tissue fullness in the nasopharynx could relate to retained secretions, especially given absence of mastoid effusions.  MRI brain 3/3>>>acute left basal ganglia lacunar EEG 3/6 nml  SUBJECTIVE:  Didn't tolerate pressure support  VITAL SIGNS: BP 156/70 mmHg  Pulse 94  Temp(Src) 100.2 F (37.9 C) (Rectal)  Resp 18  Ht '5\' 4"'$  (1.626 m)  Wt 133 lb 6.1 oz (60.5 kg)  BMI 22.88 kg/m2  SpO2 99%  VENTILATOR SETTINGS: Vent Mode:  [-] PRVC FiO2 (%):  [40 %] 40 % Set Rate:  [14 bmp] 14 bmp Vt Set:  [420 mL] 420 mL PEEP:  [10 cmH20-12 cmH20] 10 cmH20 Plateau Pressure:  [16 cmH20-25 cmH20] 22 cmH20  INTAKE / OUTPUT: I/O last 3 completed shifts: In: 2680.7 [I.V.:660; NG/GT:1870.7; IV Piggyback:150] Out: 2400 [Urine:2375; Stool:25]  PHYSICAL EXAMINATION: General: AA male, in NAD. Neuro: rass -1, opens eyes spontaneously HEENT: [place]/AT. PERRL, sclerae anicteric, massive tongue swelling Cardiovascular: s1 s2 RRT  Lungs: Coarse Abdomen: BS x 4, soft, NT/ND.  Musculoskeletal: No gross deformities, no edema.  Skin: Intact, warm, small arc traumatic lesion,  appears to be scratches  BMET  Recent Labs Lab 06/14/15 0351 06/15/15 0450 06/16/15 0343  NA 148* 147* 151*  K 4.0 4.1 3.7  CL 103 108 108  CO2 '26 28 31  '$ BUN 48* 50* 42*  CREATININE 1.12 1.19 1.03  GLUCOSE 119* 124* 169*   Electrolytes  Recent Labs Lab 06/10/15 0246 06/11/15 0555  06/14/15 0351 06/15/15 0450 06/16/15 0343  CALCIUM  8.7* 8.8*  < > 8.9 8.8* 8.9  MG 2.0 1.9  --   --   --   --   PHOS  --  3.7  --   --   --   --   < > = values in this interval not displayed. CBC  Recent Labs Lab 06/14/15 0351 06/15/15 0450 06/16/15 0343  WBC 17.2* 15.2* 12.8*  HGB 9.2* 8.9* 8.7*  HCT 26.8* 27.2* 27.4*  PLT 401* 510* 532*   Coag's  Recent Labs Lab 06/14/15 0351  APTT 33  INR 1.13   Sepsis Markers  Recent Labs Lab 06/12/15 0331 06/15/15 1300 06/16/15 0343  PROCALCITON 3.36 1.30 0.76   ABG  Recent Labs Lab 06/10/15 0421  PHART 7.430  PCO2ART 45.7*  PO2ART 110.0*   Liver Enzymes  Recent Labs Lab 06/10/15 0246 06/13/15 0300  AST 140* 267*  ALT 82* 62  ALKPHOS 82 58  BILITOT 0.4 0.4  ALBUMIN 2.6* 2.1*   Cardiac Enzymes No results for input(s): TROPONINI, PROBNP in the last 168 hours. Glucose  Recent Labs Lab 06/15/15 0735 06/15/15 1216 06/15/15 1620 06/15/15 1950 06/15/15 2323 06/16/15 0324  GLUCAP 134* 119* 115* 103* 116* 147*   Imaging Dg Chest Port 1 View  06/15/2015  CLINICAL DATA:  Newly placed tracheostomy tube. EXAM: PORTABLE CHEST - 1 VIEW COMPARISON:  One-view chest x-ray from the same day. FINDINGS: A tracheostomy tube is now in place. The endotracheal tube and NG tube have been  removed. The heart size is normal. Mild interstitial coarsening a is again noted. Overall aeration is improved. The visualized soft tissues and bony thorax are unremarkable. IMPRESSION: 1. Interval placement of tracheostomy tube without radiographic evidence for complication. 2. Improved aeration bilaterally. 3. Mild interstitial coarsening remains. Electronically Signed   By: San Morelle M.D.   On: 06/15/2015 15:06   Dg Abd Portable 1v  06/15/2015  CLINICAL DATA:  NG placement EXAM: PORTABLE ABDOMEN - 1 VIEW COMPARISON:  None. FINDINGS: Enteric tube terminates in the distal gastric body. Nonobstructive bowel gas pattern. Moderate degenerative changes of the lumbar spine. IMPRESSION:  Enteric tube terminates in the distal gastric body. Electronically Signed   By: Julian Hy M.D.   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 P:   Fentanyl.gtt, low dose propofol ok RASS goal: 0  Keppra per Neuro Dc'd  dantrolene  Thiamine/folate/MVI. Temp control with tylenol & cooling blanket  PULMONARY A: Acute hypoxemic respiratory failure secondary to seizures, resp alk Concern HCAP -worseinfx Neurogenic breathing  P:  Increase PEEP 12, lowered FIO2 to 40% Can proceed with trach now that  FIo2 lower  CARDIOVASCULAR A:  H/o HTN. cocaine use Echo 2/28 nml LV fn P:  Increased cardizem 60 q 6h  Added labetalol - should be safe now PRN Hydralazine for SBP>150   RENAL A: Mild rhabdo  Improved renal FXN Hypokalemia hypernatremia P:   replete lytes as needed Hold  lasix Add free water  GASTROINTESTINAL A:   ETOH abuse. GI prophylaxis. Nutrition. P:   Continue protonix. ct TF  per nutrition.  HEMATOLOGIC A:   VTE prophylaxis. P:  Continue SCD's/heparin.   INFECTIOUS A:   Concern for meningitis  Concern PNA - aspiration vs HCAP R/o hsv P:   LP 3/1>>>negative Vancomycin 3/1>>>3/2 Rocephin 3/1>>>3/2>>> Amp 3/1>>>3/2 Acycolovir 3/1>>>3/2  Sputum with pneumococcus.  Rocephin restarted 3/2, consider stopping 3/11- 10 days if pct down 9was rising) Fever from neuro? Rpt pan cx results pending   ENDOCRINE A:   No acute issues.   P:   Monitor blood sugar  Chesley Mires, MD [place] 06/16/2015, 7:39 AM Pager:  [phone] After 3pm call: [phone]

## 2015-06-17 ENCOUNTER — Inpatient Hospital Stay (HOSPITAL_COMMUNITY): Payer: Medicare Other

## 2015-06-17 LAB — BASIC METABOLIC PANEL
Anion gap: 9 (ref 5–15)
BUN: 38 mg/dL — ABNORMAL HIGH (ref 6–20)
CO2: 29 mmol/L (ref 22–32)
Calcium: 8.4 mg/dL — ABNORMAL LOW (ref 8.9–10.3)
Chloride: 109 mmol/L (ref 101–111)
Creatinine, Ser: 0.98 mg/dL (ref 0.61–1.24)
GFR calc Af Amer: >60 mL/min (ref >60)
GFR calc non Af Amer: >60 mL/min (ref >60)
Glucose, Bld: 146 mg/dL — ABNORMAL HIGH (ref 65–99)
Potassium: 3.8 mmol/L (ref 3.5–5.1)
Sodium: 147 mmol/L — ABNORMAL HIGH (ref 135–145)

## 2015-06-17 LAB — GLUCOSE, CAPILLARY
GLUCOSE-CAPILLARY: 124 mg/dL — AB (ref 65–99)
GLUCOSE-CAPILLARY: 96 mg/dL (ref 65–99)
Glucose-Capillary: 118 mg/dL — ABNORMAL HIGH (ref 65–99)
Glucose-Capillary: 135 mg/dL — ABNORMAL HIGH (ref 65–99)
Glucose-Capillary: 110 mg/dL — ABNORMAL HIGH (ref 65–99)
Glucose-Capillary: 139 mg/dL — ABNORMAL HIGH (ref 65–99)

## 2015-06-17 LAB — CBC
HCT: 24.8 % — ABNORMAL LOW (ref 39.0–52.0)
Hemoglobin: 7.9 g/dL — ABNORMAL LOW (ref 13.0–17.0)
MCH: 29.8 pg (ref 26.0–34.0)
MCHC: 31.9 g/dL (ref 30.0–36.0)
MCV: 93.6 fL (ref 78.0–100.0)
Platelets: 547 10*3/uL — ABNORMAL HIGH (ref 150–400)
RBC: 2.65 MIL/uL — ABNORMAL LOW (ref 4.22–5.81)
RDW: 15.4 % (ref 11.5–15.5)
WBC: 11.6 10*3/uL — ABNORMAL HIGH (ref 4.0–10.5)

## 2015-06-17 LAB — PROCALCITONIN: PROCALCITONIN: 0.55 ng/mL

## 2015-06-17 MED ORDER — FOLIC ACID 1 MG PO TABS
1.0000 mg | ORAL_TABLET | Freq: Every day | ORAL | Status: DC
  Administered 2015-06-17 – 2015-06-21 (×5): 1 mg
  Filled 2015-06-17 (×5): qty 1

## 2015-06-17 MED ORDER — VITAMIN B-1 100 MG PO TABS
100.0000 mg | ORAL_TABLET | Freq: Every day | ORAL | Status: DC
  Administered 2015-06-17 – 2015-06-21 (×5): 100 mg
  Filled 2015-06-17 (×5): qty 1

## 2015-06-17 NOTE — Progress Notes (Signed)
PULMONARY / CRITICAL CARE MEDICINE   Name: Gregory Parker MRN: [id number] DOB: July 03, 1955    ADMISSION DATE:  06/04/2015 CONSULTATION DATE:  06/04/2015  [facility]ING [facility]  CHIEF COMPLAINT:  AMS  SUBJECTIVE:  Didn't tolerate pressure support  VITAL SIGNS: BP 133/65 mmHg  Pulse 80  Temp(Src) 100.2 F (37.9 C) (Rectal)  Resp 22  Ht 5\' 4"  (1.626 m)  Wt 138 lb 14.2 oz (63 kg)  BMI 23.83 kg/m2  SpO2 94%  VENTILATOR SETTINGS: Vent Mode:  [-] PCV FiO2 (%):  [40 %] 40 % Set Rate:  [14 bmp] 14 bmp Vt Set:  [420 mL] 420 mL PEEP:  [6 cmH20] 6 cmH20 Plateau Pressure:  [20 cmH20-21 cmH20] 21 cmH20  INTAKE / OUTPUT: I/O last 3 completed shifts: In: 3925.5 [I.V.:525; NG/GT:3250.5; IV Piggyback:150] Out: 1880 [Urine:1880]  PHYSICAL EXAMINATION: General: opens eyes Neuro: doesn't follow commands HEENT: massive tongue swelling, trach site clean Cardiac: regular Chest: b/l rhonchi Abd: soft, non tender Ext: 1+ edema Skin: no rashes  BMET  Recent Labs Lab 06/15/15 0450 06/16/15 0343 06/17/15 0454  NA 147* 151* 147*  K 4.1 3.7 3.8  CL 108 108 109  CO2 28 31 29   BUN 50* 42* 38*  CREATININE 1.19 1.03 0.98  GLUCOSE 124* 169* 146*   Electrolytes  Recent Labs Lab 06/11/15 0555  06/15/15 0450 06/16/15 0343 06/17/15 0454  CALCIUM 8.8*  < > 8.8* 8.9 8.4*  MG 1.9  --   --   --   --   PHOS 3.7  --   --   --   --   < > = values in this interval not displayed. CBC  Recent Labs Lab 06/15/15 0450 06/16/15 0343 06/17/15 0454  WBC 15.2* 12.8* 11.6*  HGB 8.9* 8.7* 7.9*  HCT 27.2* 27.4* 24.8*  PLT 510* 532* 547*   Coag's  Recent Labs Lab 06/14/15 0351  APTT 33  INR 1.13   Sepsis Markers  Recent Labs Lab 06/15/15 1300 06/16/15 0343 06/17/15 0454  PROCALCITON 1.30 0.76 0.55   ABG No results for input(s): PHART, PCO2ART, PO2ART in the last 168 hours. Liver Enzymes  Recent Labs Lab 06/13/15 0300  AST 267*  ALT 62  ALKPHOS 58  BILITOT  0.4  ALBUMIN 2.1*   Cardiac Enzymes No results for input(s): TROPONINI, PROBNP in the last 168 hours. Glucose  Recent Labs Lab 06/16/15 0812 06/16/15 1210 06/16/15 1538 06/16/15 1936 06/16/15 2329 06/17/15 0353  GLUCAP 154* 128* 136* 131* 130* 124*   Imaging No results found.   CULTURES: BCx2 2/27 , 2/28 >ng 3/2 resp >>> strep PNA 3/8 bld >> 3/8 resp >> nml flora  LINES/TUBES: ETT 2/27>>> 3/10 Trach 3/10 >>  SIGNIFICANT EVENTS: 2/27 AMS, ? Seizure, intubated in [facility] ED, [facility] 3/05 cardene gtt 3/05 dantrolene stopped 3/10 change to pressure control  STUDIES:  CT head 2/27 >  No acute intracranial abnormality. Remote bilateral basal ganglia lacunar infarcts. Apparent soft tissue fullness in the nasopharynx could relate to retained secretions, especially given absence of mastoid effusions.  MRI brain 3/3>>>acute left basal ganglia lacunar EEG 3/6 nml  DISCUSSION: 60 yo male presented to [facility] with altered mental status.  He has hx of ETOH and CVA, and reported to smoke crack prior to admission.  Concern was he had alcohol withdrawal seizure.  Intubated for airway protection.    ASSESSMENT / PLAN:  NEUROLOGIC A:   Acute encephalopathy. Lt basal ganglia infarct.  Alcohol withdrawal seizures. P:   Monitor mental status ASA Thiamine, folic acid  PULMONARY A: Acute respiratory failure in setting of seizures an HCAP. Failure to wean s/p trach. P:   Pressure control F/u CXR intermittently  CARDIOVASCULAR A:  HTN. P:  Continue cardizem  RENAL A:  Hypernatremia. Rhabdomyolysis on admission >> resolved. P:   Continue free water  GASTROINTESTINAL A:   Protein calorie malnutrition. P:   Continue tube feeds Will need to be assessed for G tube Protonix for SUP  HEMATOLOGIC A:   Anemia of chronic disease and critical illness. P:  F/u CBC SQ heparin  INFECTIOUS A:   CAP with pneumococcus in sputum. P:   LP 3/1>>>negative Vancomycin  3/1>>>3/2 Rocephin 3/1>>>3/2>>> Amp 3/1>>>3/2 Acycolovir 3/1>>>3/2  D/c rocephin after dose on 3/12  ENDOCRINE A:   No acute issues.   P:   Monitor blood sugar  Disposition >> consult case manager to assess for LTAC  CC time 31 minutes.   Coralyn Helling, MD [facility] Pulmonary/Critical Care 06/17/2015, 8:11 AM Pager:  [phone] After 3pm call: [phone]

## 2015-06-18 ENCOUNTER — Inpatient Hospital Stay (HOSPITAL_COMMUNITY): Payer: Medicare Other

## 2015-06-18 DIAGNOSIS — J690 Pneumonitis due to inhalation of food and vomit: Secondary | ICD-10-CM

## 2015-06-18 DIAGNOSIS — I6330 Cerebral infarction due to thrombosis of unspecified cerebral artery: Secondary | ICD-10-CM

## 2015-06-18 LAB — GLUCOSE, CAPILLARY
GLUCOSE-CAPILLARY: 118 mg/dL — AB (ref 65–99)
Glucose-Capillary: 124 mg/dL — ABNORMAL HIGH (ref 65–99)
Glucose-Capillary: 117 mg/dL — ABNORMAL HIGH (ref 65–99)
Glucose-Capillary: 108 mg/dL — ABNORMAL HIGH (ref 65–99)
Glucose-Capillary: 118 mg/dL — ABNORMAL HIGH (ref 65–99)

## 2015-06-18 LAB — CBC
HCT: 25.7 % — ABNORMAL LOW (ref 39.0–52.0)
HEMOGLOBIN: 8.1 g/dL — AB (ref 13.0–17.0)
MCH: 29.5 pg (ref 26.0–34.0)
MCHC: 31.5 g/dL (ref 30.0–36.0)
MCV: 93.5 fL (ref 78.0–100.0)
Platelets: 574 10*3/uL — ABNORMAL HIGH (ref 150–400)
RBC: 2.75 MIL/uL — AB (ref 4.22–5.81)
RDW: 15.3 % (ref 11.5–15.5)
WBC: 11.6 10*3/uL — ABNORMAL HIGH (ref 4.0–10.5)

## 2015-06-18 LAB — COMPREHENSIVE METABOLIC PANEL
ALK PHOS: 68 U/L (ref 38–126)
ALT: 62 U/L (ref 17–63)
AST: 83 U/L — ABNORMAL HIGH (ref 15–41)
Albumin: 1.8 g/dL — ABNORMAL LOW (ref 3.5–5.0)
Anion gap: 10 (ref 5–15)
BILIRUBIN TOTAL: 0.5 mg/dL (ref 0.3–1.2)
BUN: 33 mg/dL — AB (ref 6–20)
CHLORIDE: 105 mmol/L (ref 101–111)
CO2: 31 mmol/L (ref 22–32)
CREATININE: 0.86 mg/dL (ref 0.61–1.24)
Calcium: 8.5 mg/dL — ABNORMAL LOW (ref 8.9–10.3)
Glucose, Bld: 136 mg/dL — ABNORMAL HIGH (ref 65–99)
POTASSIUM: 3.9 mmol/L (ref 3.5–5.1)
Sodium: 146 mmol/L — ABNORMAL HIGH (ref 135–145)
TOTAL PROTEIN: 6 g/dL — AB (ref 6.5–8.1)

## 2015-06-18 LAB — CULTURE, BLOOD (ROUTINE X 2)
CULTURE: NO GROWTH
Culture: NO GROWTH

## 2015-06-18 LAB — PHOSPHORUS: PHOSPHORUS: 3.6 mg/dL (ref 2.5–4.6)

## 2015-06-18 LAB — MAGNESIUM: MAGNESIUM: 2.1 mg/dL (ref 1.7–2.4)

## 2015-06-18 MED ORDER — WHITE PETROLATUM GEL
Status: AC
  Filled 2015-06-18: qty 1

## 2015-06-18 MED ORDER — FENTANYL CITRATE (PF) 100 MCG/2ML IJ SOLN
50.0000 ug | INTRAMUSCULAR | Status: DC | PRN
  Administered 2015-06-18 – 2015-06-22 (×28): 100 ug via INTRAVENOUS
  Filled 2015-06-18 (×28): qty 2

## 2015-06-18 NOTE — Progress Notes (Signed)
50 mL fentanyl gtt wasted in sink with Amie CritchleyKaty Weddle, RN.

## 2015-06-18 NOTE — Progress Notes (Signed)
Family visited, they are concerned about pt's tongue swelling. I answered what questions I could and assured them I was doing all I knew to do to keep his mouth clean and moist, and they were invited to come discuss concerns with the MD at AM rounds.

## 2015-06-18 NOTE — Progress Notes (Signed)
PULMONARY / CRITICAL CARE MEDICINE   Name: Gregory Parker MRN: [id number] DOB: 07-Jan-1956    ADMISSION DATE:  06/04/2015 CONSULTATION DATE:  06/04/2015  REFERRING MD:  [facility]  CHIEF COMPLAINT:  AMS  SUBJECTIVE:  On ATC this am and tolerating well.   VITAL SIGNS: BP 162/77 mmHg  Pulse 90  Temp(Src) 100.2 F (37.9 C) (Core (Comment))  Resp 22  Ht  (1.626 m)  Wt 64.1 kg (141 lb 5 oz)  BMI 24.24 kg/m2  SpO2 94%  VENTILATOR SETTINGS: Vent Mode:  [-] PSV;CPAP FiO2 (%):  [40 %] 40 % Set Rate:  [14 bmp] 14 bmp PEEP:  [5 cmH20] 5 cmH20 Pressure Support:  [10 cmH20] 10 cmH20 Plateau Pressure:  [24 cmH20-27 cmH20] 24 cmH20  INTAKE / OUTPUT: I/O last 3 completed shifts: In: 58 [I.V.:525; NG/GT:4050; IV Piggyback:100] Out: 1765 [Urine:1765]  PHYSICAL EXAMINATION: General: opens eyes Neuro: doesn't follow commands HEENT: massive tongue swelling, trach site clean Cardiac: regular Chest: b/l rhonchi Abd: soft, non tender Ext: 1+ edema Skin: no rashes  BMET  Recent Labs Lab 06/16/15 0343 06/17/15 0454 06/18/15 0603  NA 151* 147* 146*  K 3.7 3.8 3.9  CL 108 109 105  CO2 BUN 42* 38* 33*  CREATININE 1.03 0.98 0.86  GLUCOSE 169* 146* 136*   Electrolytes  Recent Labs Lab 06/16/15 0343 06/17/15 0454 06/18/15 0603  CALCIUM 8.9 8.4* 8.5*  MG  --   --  2.1  PHOS  --   --  3.6   CBC  Recent Labs Lab 06/16/15 0343 06/17/15 0454 06/18/15 0603  WBC 12.8* 11.6* 11.6*  HGB 8.7* 7.9* 8.1*  HCT 27.4* 24.8* 25.7*  PLT 532* 547* 574*   Coag's  Recent Labs Lab 06/14/15 0351  APTT 33  INR 1.13   Sepsis Markers  Recent Labs Lab 06/15/15 1300 06/16/15 0343 06/17/15 0454  PROCALCITON 1.30 0.76 0.55   ABG No results for input(s): PHART, PCO2ART, PO2ART in the last 168 hours. Liver Enzymes  Recent Labs Lab 06/13/15 0300 06/18/15 0603  AST 267* 83*  ALT 62 62  ALKPHOS 58 68  BILITOT 0.4 0.5  ALBUMIN 2.1* 1.8*   Cardiac  Enzymes No results for input(s): TROPONINI, PROBNP in the last 168 hours. Glucose  Recent Labs Lab 06/17/15 1156 06/17/15 1622 06/17/15 1926 06/17/15 2316 06/18/15 0334 06/18/15 0731  GLUCAP 135* 96 110* 139* 124* 118*   Imaging Dg Chest Port 1 View  06/18/2015  CLINICAL DATA:  Patient with respiratory failure. EXAM: PORTABLE CHEST 1 VIEW COMPARISON:  Chest radiograph 06/17/2015. FINDINGS: Tracheostomy tube stable in position. Enteric tube tip and side-port project over the stomach. Grossly unchanged diffuse bilateral airspace pulmonary opacities. No pleural effusion or pneumothorax. Regional skeleton is unremarkable. IMPRESSION: Grossly unchanged diffuse bilateral airspace opacities. Considerations include multi focal pneumonia, edema or hemorrhage. Electronically Signed   By: Annia Belt M.D.   On: 06/18/2015 07:57     CULTURES: BCx2 2/27 , 2/28 >ng 3/2 resp >>> strep PNA (sens) 3/8 bld >> 3/8 resp >> nml flora  LINES/TUBES: ETT 2/27>>> 3/10 Trach 3/10 >>  SIGNIFICANT EVENTS: 2/27 AMS, ? Seizure, intubated in [facility], tx to [facility] ICU 3/05 cardene gtt 3/05 dantrolene stopped 3/10 change to pressure control  STUDIES:  CT head 2/27 >  No acute intracranial abnormality. Remote bilateral basal ganglia lacunar infarcts. Apparent soft tissue fullness in the nasopharynx could relate to retained secretions, especially given absence of mastoid  effusions.  MRI brain 3/3>>>acute left basal ganglia lacunar EEG 3/6 nml  DISCUSSION: 60 yo male presented to [facility] with altered mental status.  He has hx of ETOH and CVA, and reported to smoke crack prior to admission.  Concern was he had alcohol withdrawal seizure.  Intubated for airway protection. Now w trach   ASSESSMENT / PLAN:  NEUROLOGIC A:   Acute encephalopathy. 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 P:   Monitor mental status, wean fentanyl gtt to off on 3/13 ASA Thiamine, folic acid  PULMONARY A: Acute  respiratory failure in setting of seizures an HCAP. Failure to wean s/p trach, beginning some ATC on 3/13 Apparent angioedema, tongue enlargement P:   ATC as tolerated, consider PSV as rest mode (has been on PCV) F/u CXR intermittently Cause tongue enlargement unclear, will d/c potential offending meds as able.   CARDIOVASCULAR A:  HTN. P:  Continue cardizem per tube ASA   RENAL A:  Hypernatremia. Rhabdomyolysis on admission >> resolved. P:   Continue free water as ordered  GASTROINTESTINAL A:   Protein calorie malnutrition. P:   Continue tube feeds Will need to be assessed for G tube Protonix for SUP  HEMATOLOGIC A:   Anemia of chronic disease and critical illness. P:  F/u CBC SQ heparin  INFECTIOUS A:   CAP with pneumococcus in sputum. P:   LP 3/1>>>negative Vancomycin 3/1>>>3/2 Rocephin 3/1>>>3/2>>> 3/12 Amp 3/1>>>3/2 Acycolovir 3/1>>>3/2  Completed course ceftriaxone 3/12  ENDOCRINE A:   No acute issues.   P:   Monitor blood sugar  Disposition >> consult case manager to assess for LTAC  CC time 32 minutes.   Levy Pupaobert Teruo Stilley, MD, PhD 06/18/2015, 10:31 AM [place] [facility] [phone] or if no answer [phone]

## 2015-06-19 ENCOUNTER — Inpatient Hospital Stay (HOSPITAL_COMMUNITY): Payer: Medicare Other

## 2015-06-19 LAB — GLUCOSE, CAPILLARY
GLUCOSE-CAPILLARY: 114 mg/dL — AB (ref 65–99)
GLUCOSE-CAPILLARY: 126 mg/dL — AB (ref 65–99)
Glucose-Capillary: 114 mg/dL — ABNORMAL HIGH (ref 65–99)
Glucose-Capillary: 114 mg/dL — ABNORMAL HIGH (ref 65–99)
Glucose-Capillary: 117 mg/dL — ABNORMAL HIGH (ref 65–99)
Glucose-Capillary: 123 mg/dL — ABNORMAL HIGH (ref 65–99)

## 2015-06-19 LAB — CBC
HCT: 27.5 % — ABNORMAL LOW (ref 39.0–52.0)
Hemoglobin: 8.7 g/dL — ABNORMAL LOW (ref 13.0–17.0)
MCH: 29.3 pg (ref 26.0–34.0)
MCHC: 31.6 g/dL (ref 30.0–36.0)
MCV: 92.6 fL (ref 78.0–100.0)
PLATELETS: 604 10*3/uL — AB (ref 150–400)
RBC: 2.97 MIL/uL — ABNORMAL LOW (ref 4.22–5.81)
RDW: 15.4 % (ref 11.5–15.5)
WBC: 13.5 10*3/uL — AB (ref 4.0–10.5)

## 2015-06-19 LAB — BASIC METABOLIC PANEL
ANION GAP: 13 (ref 5–15)
BUN: 29 mg/dL — ABNORMAL HIGH (ref 6–20)
CHLORIDE: 106 mmol/L (ref 101–111)
CO2: 29 mmol/L (ref 22–32)
Calcium: 8.8 mg/dL — ABNORMAL LOW (ref 8.9–10.3)
Creatinine, Ser: 0.82 mg/dL (ref 0.61–1.24)
Glucose, Bld: 125 mg/dL — ABNORMAL HIGH (ref 65–99)
POTASSIUM: 4.4 mmol/L (ref 3.5–5.1)
Sodium: 148 mmol/L — ABNORMAL HIGH (ref 135–145)

## 2015-06-19 LAB — MAGNESIUM: Magnesium: 2 mg/dL (ref 1.7–2.4)

## 2015-06-19 LAB — PHOSPHORUS: Phosphorus: 4 mg/dL (ref 2.5–4.6)

## 2015-06-19 LAB — PROTIME-INR
INR: 1.06 (ref 0.00–1.49)
PROTHROMBIN TIME: 14 s (ref 11.6–15.2)

## 2015-06-19 LAB — APTT: APTT: 36 s (ref 24–37)

## 2015-06-19 NOTE — Progress Notes (Signed)
CM referral for Long Term Acute Care Facility.  Referral to both area LTACs.  Pt has Medicaid only; may be difficult for LTAC to accept due to payor.    Quintella BatonJulie W. Liara Holm, RN, BSN  Trauma/Neuro ICU Case Manager [phone]

## 2015-06-19 NOTE — Progress Notes (Signed)
Nutrition Follow-up  DOCUMENTATION CODES:   Severe malnutrition in context of chronic illness  INTERVENTION:   Continue Vital AF 1.2 @ 70 ml/hr Provides: 2016 kcal (104% of needs), 126 grams protein, and 1362 mlH2O.   NUTRITION DIAGNOSIS:   Malnutrition related to chronic illness as evidenced by severe depletion of body fat, severe depletion of muscle mass. Ongoing.   GOAL:   Patient will meet greater than or equal to 90% of their needs Met.   MONITOR:   TF tolerance, I & O's, Vent status, Labs  ASSESSMENT:   Pt with etoh and drug abuse history presenting with seizures and acute CVA.  Patient is currently intubated on ventilator support MV: 11.9 L/min Temp (24hrs), Avg:100.4 F (38 C), Min:98.9 F (37.2 C), Max:101.7 F (38.7 C)  Medications reviewed and include: folic acid, MVI, thiamine Free water: 200 ml every 4 hours = 1200 ml  CBG's: 114-126 Labs reviewed: sodium elevated 148 - on free water  3/10 trach placed IR consulted for possible PEG placement   Diet Order:  Diet NPO time specified  Skin:  Reviewed, no issues  Last BM:  3/10  Height:   Ht Readings from Last 1 Encounters:  06/04/15 _0  (1.626 m)    Weight:   Wt Readings from Last 1 Encounters:  06/19/15 141 lb 12.1 oz (64.3 kg)    Ideal Body Weight:  59 kg  BMI:  Body mass index is 24.32 kg/(m^2).  Estimated Nutritional Needs:   Kcal:  1928  Protein:  95-120 grams  Fluid:  > 1.6 L/day  EDUCATION NEEDS:   No education needs identified at this time  [place], [place], [place] Pager [phone] After Hours Pager

## 2015-06-19 NOTE — Progress Notes (Signed)
PULMONARY / CRITICAL CARE MEDICINE   Name: Gregory Parker MRN: [id number] DOB: December 21, 1955    ADMISSION DATE:  06/04/2015 CONSULTATION DATE:  06/04/2015  REFERRING MD:  [facility]  CHIEF COMPLAINT:  AMS  SUBJECTIVE:  Did some ATC 3/13 but not yet able to tolerate PSV today   VITAL SIGNS: BP 164/86 mmHg  Pulse 105  Temp(Src) 101 F (38.3 C) (Rectal)  Resp 31  Ht  (1.626 m)  Wt 64.3 kg (141 lb 12.1 oz)  BMI 24.32 kg/m2  SpO2 100%  VENTILATOR SETTINGS: Vent Mode:  [-] PCV FiO2 (%):  [40 %] 40 % Set Rate:  [14 bmp] 14 bmp PEEP:  [5 cmH20] 5 cmH20 Plateau Pressure:  [21 cmH20-23 cmH20] 21 cmH20  INTAKE / OUTPUT: I/O last 3 completed shifts: In: 4130 [I.V.:210; NG/GT:3920] Out: 2290 [Urine:2290]  PHYSICAL EXAMINATION: General: opens eyes Neuro: doesn't follow commands HEENT: massive tongue swelling, trach site clean Cardiac: regular Chest: b/l rhonchi Abd: soft, non tender Ext: 1+ edema Skin: no rashes  BMET  Recent Labs Lab 06/17/15 0454 06/18/15 0603 06/19/15 0304  NA 147* 146* 148*  K 3.8 3.9 4.4  CL 109 105 106  CO2 BUN 38* 33* 29*  CREATININE 0.98 0.86 0.82  GLUCOSE 146* 136* 125*   Electrolytes  Recent Labs Lab 06/17/15 0454 06/18/15 0603 06/19/15 0304  CALCIUM 8.4* 8.5* 8.8*  MG  --  2.1 2.0  PHOS  --  3.6 4.0   CBC  Recent Labs Lab 06/17/15 0454 06/18/15 0603 06/19/15 0304  WBC 11.6* 11.6* 13.5*  HGB 7.9* 8.1* 8.7*  HCT 24.8* 25.7* 27.5*  PLT 547* 574* 604*   Coag's  Recent Labs Lab 06/14/15 0351  APTT 33  INR 1.13   Sepsis Markers  Recent Labs Lab 06/15/15 1300 06/16/15 0343 06/17/15 0454  PROCALCITON 1.30 0.76 0.55   ABG No results for input(s): PHART, PCO2ART, PO2ART in the last 168 hours. Liver Enzymes  Recent Labs Lab 06/13/15 0300 06/18/15 0603  AST 267* 83*  ALT 62 62  ALKPHOS 58 68  BILITOT 0.4 0.5  ALBUMIN 2.1* 1.8*   Cardiac Enzymes No results for input(s): TROPONINI,  PROBNP in the last 168 hours. Glucose  Recent Labs Lab 06/18/15 1123 06/18/15 1523 06/18/15 1936 06/18/15 2358 06/19/15 0324 06/19/15 0806  GLUCAP 117* 108* 118* 123* 114* 114*   Imaging Dg Chest Port 1 View  06/19/2015  CLINICAL DATA:  Acute respiratory failure EXAM: PORTABLE CHEST 1 VIEW COMPARISON:  06/18/2015 FINDINGS: Cardiac shadow is stable. A tracheostomy tube and nasogastric catheter are noted in satisfactory position. Bilateral pulmonary infiltrates are noted relatively similar to that seen on the prior exam with the exception of some improved aeration in the right upper lobe. IMPRESSION: Diffuse bilateral infiltrates. Electronically Signed   By: Alcide Clever M.D.   On: 06/19/2015 08:15     CULTURES: BCx2 2/27 , 2/28 >ng 3/2 resp >>> strep PNA (sens) 3/8 bld >> 3/8 resp >> nml flora  LINES/TUBES: ETT 2/27>>> 3/10 Trach 3/10 >>  SIGNIFICANT EVENTS: 2/27 AMS, ? Seizure, intubated in [facility] ED, tx to [facility] ICU 3/05 cardene gtt 3/05 dantrolene stopped 3/10 change to pressure control  STUDIES:  CT head 2/27 >  No acute intracranial abnormality. Remote bilateral basal ganglia lacunar infarcts. Apparent soft tissue fullness in the nasopharynx could relate to retained secretions, especially given absence of mastoid effusions.  MRI brain 3/3>>>acute left basal ganglia lacunar EEG 3/6 nml  DISCUSSION: 60 yo male presented to [facility] with altered mental status.  He has hx of ETOH and CVA, and reported to smoke crack prior to admission.  Concern was he had alcohol withdrawal seizure.  Intubated for airway protection. Now w trach   ASSESSMENT / PLAN:  NEUROLOGIC A:   Acute encephalopathy. 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 P:   Monitor mental status, wean fentanyl gtt to off on 3/13 ASA Thiamine, folic acid  PULMONARY A: Acute respiratory failure in setting of seizures an HCAP. Failure to wean s/p trach, beginning some ATC on 3/13 Apparent  angioedema, tongue enlargement P:   ATC as tolerated, consider PSV as rest mode (has been on PCV) F/u CXR intermittently Cause tongue enlargement unclear, will d/c potential offending meds as able.   CARDIOVASCULAR A:  HTN. P:  Continue cardizem per tube ASA   RENAL A:  Hypernatremia. Rhabdomyolysis on admission >> resolved. P:   Continue free water as ordered  GASTROINTESTINAL A:   Protein calorie malnutrition. P:   Continue tube feeds Will as IR to assess for PEG Protonix for SUP  HEMATOLOGIC A:   Anemia of chronic disease and critical illness. P:  F/u CBC SQ heparin  INFECTIOUS A:   CAP with pneumococcus in sputum. P:   LP 3/1>>>negative Vancomycin 3/1>>>3/2 Rocephin 3/1>>>3/2>>> 3/12 Amp 3/1>>>3/2 Acycolovir 3/1>>>3/2  Completed course ceftriaxone 3/12 for pneumococcus  Would repeat cx's if fever or clinical change  ENDOCRINE A:   No acute issues.   P:   Monitor blood sugar, has not require coverage  Disposition >> case manager assessing for LTAC  CC time 32 minutes.   Levy Pupaobert Farron Watrous, MD, PhD 06/19/2015, 10:19 AM [place] [facility] [phone] or if no answer [phone]

## 2015-06-20 ENCOUNTER — Encounter (HOSPITAL_COMMUNITY): Payer: Self-pay | Admitting: Radiology

## 2015-06-20 ENCOUNTER — Inpatient Hospital Stay (HOSPITAL_COMMUNITY): Payer: Medicare Other

## 2015-06-20 LAB — BASIC METABOLIC PANEL
ANION GAP: 12 (ref 5–15)
BUN: 27 mg/dL — ABNORMAL HIGH (ref 6–20)
CALCIUM: 8.5 mg/dL — AB (ref 8.9–10.3)
CO2: 25 mmol/L (ref 22–32)
Chloride: 104 mmol/L (ref 101–111)
Creatinine, Ser: 0.75 mg/dL (ref 0.61–1.24)
GFR calc Af Amer: >60 mL/min (ref >60)
Glucose, Bld: 121 mg/dL — ABNORMAL HIGH (ref 65–99)
POTASSIUM: 4.5 mmol/L (ref 3.5–5.1)
SODIUM: 141 mmol/L (ref 135–145)

## 2015-06-20 LAB — CBC
HEMATOCRIT: 25.4 % — AB (ref 39.0–52.0)
HEMOGLOBIN: 8.6 g/dL — AB (ref 13.0–17.0)
MCH: 31.2 pg (ref 26.0–34.0)
MCHC: 33.9 g/dL (ref 30.0–36.0)
MCV: 92 fL (ref 78.0–100.0)
Platelets: 585 10*3/uL — ABNORMAL HIGH (ref 150–400)
RBC: 2.76 MIL/uL — ABNORMAL LOW (ref 4.22–5.81)
RDW: 15.2 % (ref 11.5–15.5)
WBC: 14.4 10*3/uL — AB (ref 4.0–10.5)

## 2015-06-20 LAB — GLUCOSE, CAPILLARY
GLUCOSE-CAPILLARY: 110 mg/dL — AB (ref 65–99)
GLUCOSE-CAPILLARY: 110 mg/dL — AB (ref 65–99)
Glucose-Capillary: 118 mg/dL — ABNORMAL HIGH (ref 65–99)
Glucose-Capillary: 106 mg/dL — ABNORMAL HIGH (ref 65–99)
Glucose-Capillary: 112 mg/dL — ABNORMAL HIGH (ref 65–99)
Glucose-Capillary: 112 mg/dL — ABNORMAL HIGH (ref 65–99)
Glucose-Capillary: 98 mg/dL (ref 65–99)

## 2015-06-20 MED ORDER — SODIUM CHLORIDE 0.9 % IV SOLN
0.0000 ug/h | INTRAVENOUS | Status: DC
  Administered 2015-06-20: 50 ug/h via INTRAVENOUS
  Filled 2015-06-20: qty 50

## 2015-06-20 MED ORDER — METHYLPREDNISOLONE SODIUM SUCC 125 MG IJ SOLR
60.0000 mg | Freq: Four times a day (QID) | INTRAMUSCULAR | Status: DC
  Administered 2015-06-20 – 2015-06-21 (×2): 60 mg via INTRAVENOUS
  Filled 2015-06-20 (×2): qty 2

## 2015-06-20 MED ORDER — FAMOTIDINE IN NACL 20-0.9 MG/50ML-% IV SOLN
20.0000 mg | Freq: Two times a day (BID) | INTRAVENOUS | Status: DC
  Administered 2015-06-20 – 2015-06-21 (×2): 20 mg via INTRAVENOUS
  Filled 2015-06-20 (×2): qty 50

## 2015-06-20 NOTE — Progress Notes (Signed)
PULMONARY / CRITICAL CARE MEDICINE   Name: Gregory Parker MRN: [id number] DOB: [birth year]    ADMISSION DATE:  06/04/2015 CONSULTATION DATE:  06/04/2015  REFERRING MD:  [facility]P at [facility]  CHIEF COMPLAINT:  AMS  SUBJECTIVE:  Some fever last 24h Tachypnea, did not tolerate ATC last 24h   VITAL SIGNS: BP 153/87 mmHg  Pulse 90  Temp(Src) 99.7 F (37.6 C) (Rectal)  Resp 19  Ht  (1.626 m)  Wt 64.4 kg (141 lb 15.6 oz)  BMI 24.36 kg/m2  SpO2 100%  VENTILATOR SETTINGS: Vent Mode:  [-] PCV FiO2 (%):  [40 %] 40 % Set Rate:  [14 bmp] 14 bmp PEEP:  [5 cmH20] 5 cmH20 Pressure Support:  [8 cmH20] 8 cmH20 Plateau Pressure:  [22 cmH20] 22 cmH20  INTAKE / OUTPUT: I/O last 3 completed shifts: In: 4610 [NG/GT:4610] Out: 2650 [Urine:2650]  PHYSICAL EXAMINATION: General: opens eyes Neuro: doesn't follow commands HEENT: massive tongue swelling, trach site clean Cardiac: regular Chest: b/l rhonchi Abd: soft, non tender Ext: 1+ edema Skin: no rashes  BMET  Recent Labs Lab 06/18/15 0603 06/19/15 0304 06/20/15 0430  NA 146* 148* 141  K 3.9 4.4 4.5  CL 105 106 104  CO2 BUN 33* 29* 27*  CREATININE 0.86 0.82 0.75  GLUCOSE 136* 125* 121*   Electrolytes  Recent Labs Lab 06/18/15 0603 06/19/15 0304 06/20/15 0430  CALCIUM 8.5* 8.8* 8.5*  MG 2.1 2.0  --   PHOS 3.6 4.0  --    CBC  Recent Labs Lab 06/18/15 0603 06/19/15 0304 06/20/15 0430  WBC 11.6* 13.5* 14.4*  HGB 8.1* 8.7* 8.6*  HCT 25.7* 27.5* 25.4*  PLT 574* 604* 585*   Coag's  Recent Labs Lab 06/14/15 0351 06/19/15 1121  APTT 33 36  INR 1.13 1.06   Sepsis Markers  Recent Labs Lab 06/15/15 1300 06/16/15 0343 06/17/15 0454  PROCALCITON 1.30 0.76 0.55   ABG No results for input(s): PHART, PCO2ART, PO2ART in the last 168 hours. Liver Enzymes  Recent Labs Lab 06/18/15 0603  AST 83*  ALT 62  ALKPHOS 68  BILITOT 0.5  ALBUMIN 1.8*   Cardiac Enzymes No results for input(s):  TROPONINI, PROBNP in the last 168 hours. Glucose  Recent Labs Lab 06/19/15 1521 06/19/15 1933 06/20/15 0008 06/20/15 0408 06/20/15 0743 06/20/15 1120  GLUCAP 117* 114* 110* 110* 118* 106*   Imaging Ct Abdomen Wo Contrast  06/20/2015  CLINICAL DATA:  60 year old male with a history of dysphagia. EXAM: CT ABDOMEN WITHOUT CONTRAST TECHNIQUE: Multidetector CT imaging of the abdomen was performed following the standard protocol without IV contrast. COMPARISON:  None. FINDINGS: Lower chest: Edema/ anasarca of the lower chest wall soft tissues. Adenopathy along the left chest wall. Minimal gynecomastia. Bilateral small pleural effusions with associated atelectasis. Gastric tube within the esophagus, terminating within the stomach. Heart size within normal limits without significant pericardial fluid/ thickening. Abdomen/pelvis: Unremarkable appearance of the liver. Gallbladder is contracted with no radiopaque stones. No pericholecystic fluid. Unremarkable spleen. Unremarkable bilateral adrenal glands. Unremarkable pancreas. Visualized colon and small bowel unremarkable, with no abnormal distention. Similar to the chest wall there is vague infiltration of the abdominal mesenteric. No significant ascites. No hydronephrosis or nephrolithiasis of the visualized kidneys. Hypodense structure or on the lateral cortex of the right kidney and the inferior cortex of the right kidney, both with low Hounsfield units. Superior measures 1.5 cm. Inferior measures 1.4 cm. Scattered vascular calcifications. Degenerative changes of the spine. No  acute fracture. Multiple endplate irregularities compatible with Schmorl's nodes. Vacuum disc phenomenon of the mid lumbar spine. No significant bony canal narrowing. IMPRESSION: No acute intra abdominal process. Bilateral pleural effusions and atelectasis. Body wall and abdominal edema/ anasarca. Recommend correlation with fluid status. Right-sided low-density kidney lesions, most  likely benign cysts though not fully characterized by noncontrast CT. Signed, Yvone NeuJaime S. Loreta AveWagner, DO [facility] Electronically Signed   By: Gilmer MorJaime  Wagner D.O.   On: 06/20/2015 07:07     CULTURES: BCx2 2/27 , 2/28 >ng 3/2 resp >>> strep PNA (sens) 3/8 bld >> 3/8 resp >> nml flora\ 3/15 resp >>   LINES/TUBES: ETT 2/27>>> 3/10 Trach 3/10 >>  SIGNIFICANT EVENTS: 2/27 AMS, ? Seizure, [facility] in [facility], [facility]05 cardene gtt 3/05 dantrolene stopped 3/10 change to pressure control  STUDIES:  CT head 2/27 >  No acute intracranial abnormality. Remote bilateral basal ganglia lacunar infarcts. Apparent soft tissue fullness in the nasopharynx could relate to retained secretions, especially given absence of mastoid effusions.  MRI brain 3/3>>>acute left basal ganglia lacunar EEG 3/6 nml  DISCUSSION: 60 yo male presented to [facility] with altered mental status.  He has hx of ETOH and CVA, and reported to smoke crack prior to admission.  Concern was he had alcohol withdrawal seizure.  Intubated for airway protection. Now w trach   ASSESSMENT / PLAN:  NEUROLOGIC A:   Acute encephalopathy. 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 P:   Monitor mental status, wean fentanyl gtt to off on 3/13 ASA Thiamine, folic acid  PULMONARY A: Acute respiratory failure in setting of seizures an HCAP. Failure to wean s/p trach, beginning some ATC on 3/13 Apparent angioedema, tongue enlargement P:   ATC as tolerated, consider PSV as rest mode (has been on PCV) F/u CXR intermittently Cause tongue enlargement unclear, will d/c potential offending meds as able.   CARDIOVASCULAR A:  HTN. P:  Continue cardizem per tube ASA   RENAL A:  Hypernatremia. Rhabdomyolysis on admission >> resolved. P:   Continue free water as ordered  GASTROINTESTINAL A:   Protein calorie malnutrition. P:   Continue tube feeds IR to  assess for PEG, appreciate their assistance Protonix for SUP  HEMATOLOGIC A:   Anemia of chronic disease and critical illness. Leukocytosis  P:  F/u CBC SQ heparin  INFECTIOUS A:   CAP with pneumococcus in sputum. P:   LP 3/1>>>negative Vancomycin 3/1>>>3/2 Rocephin 3/1>>>3/2>>> 3/12 Amp 3/1>>>3/2 Acycolovir 3/1>>>3/2  Completed course ceftriaxone 3/12 for pneumococcus  Will repeat cx's 3/15, check Pct   ENDOCRINE A:   No acute issues.   P:   Monitor blood sugar, has not require coverage  Disposition >> case manager assessing for LTAC vs Vent-SNF    Levy Pupaobert Ioan Landini, MD, PhD 06/20/2015, 2:27 PM [facility] [phone] or if no answer [phone]

## 2015-06-20 NOTE — Progress Notes (Signed)
Patient ID: Gregory Parker, male   DOB: 1955/12/07, 60 y.o.   MRN: [id number]   Aware of percutaneous gastric tube request  CT Abd in review T 101.4 Wbc 14.4  Await to be afeb; wbc trending down And CT review  On IR radar

## 2015-06-20 NOTE — Progress Notes (Signed)
eLink Physician-Brief Progress Note Patient Name: Gregory Parker DOB: [birth year] MRN: [id number]   Date of Service  06/20/2015  HPI/Events of Note  Agitation - increased RR.  eICU Interventions  Will order a Fentanyl IV infusion. Titrate to RASS 0 to -1.      Intervention Category Minor Interventions: Agitation / anxiety - evaluation and management  Zakariya Knickerbocker Eugene 06/20/2015, 9:06 PM

## 2015-06-20 NOTE — Progress Notes (Signed)
eLink Physician-Brief Progress Note Patient Name: Gregory Parker DOB: 03-Oct-1955 MRN: [id number]   Date of Service  06/20/2015  HPI/Events of Note  Nurses state that patient's tongue large than previous. Hx of Angioedema.  eICU Interventions  Will order: 1. Solumedrol 60 mg IV now and Q 6 hours. 2. Pepcid 20 mg IV now and Q 12 hours.      Intervention Category Major Interventions: Other:  Lenell AntuSommer,Lianah Peed Eugene 06/20/2015, 10:10 PM

## 2015-06-21 ENCOUNTER — Inpatient Hospital Stay (HOSPITAL_COMMUNITY): Payer: Medicare Other

## 2015-06-21 LAB — BASIC METABOLIC PANEL
ANION GAP: 9 (ref 5–15)
BUN: 31 mg/dL — ABNORMAL HIGH (ref 6–20)
CALCIUM: 8.4 mg/dL — AB (ref 8.9–10.3)
CO2: 24 mmol/L (ref 22–32)
Chloride: 105 mmol/L (ref 101–111)
Creatinine, Ser: 0.82 mg/dL (ref 0.61–1.24)
GLUCOSE: 207 mg/dL — AB (ref 65–99)
Potassium: 4.5 mmol/L (ref 3.5–5.1)
SODIUM: 138 mmol/L (ref 135–145)

## 2015-06-21 LAB — CBC
HCT: 25.1 % — ABNORMAL LOW (ref 39.0–52.0)
HEMOGLOBIN: 8 g/dL — AB (ref 13.0–17.0)
MCH: 29.3 pg (ref 26.0–34.0)
MCHC: 31.9 g/dL (ref 30.0–36.0)
MCV: 91.9 fL (ref 78.0–100.0)
Platelets: 513 10*3/uL — ABNORMAL HIGH (ref 150–400)
RBC: 2.73 MIL/uL — AB (ref 4.22–5.81)
RDW: 15.2 % (ref 11.5–15.5)
WBC: 12.9 10*3/uL — AB (ref 4.0–10.5)

## 2015-06-21 LAB — GLUCOSE, CAPILLARY
GLUCOSE-CAPILLARY: 170 mg/dL — AB (ref 65–99)
GLUCOSE-CAPILLARY: 205 mg/dL — AB (ref 65–99)
GLUCOSE-CAPILLARY: 208 mg/dL — AB (ref 65–99)
GLUCOSE-CAPILLARY: 150 mg/dL — AB (ref 65–99)
Glucose-Capillary: 135 mg/dL — ABNORMAL HIGH (ref 65–99)
Glucose-Capillary: 154 mg/dL — ABNORMAL HIGH (ref 65–99)

## 2015-06-21 LAB — URINE CULTURE: CULTURE: NO GROWTH

## 2015-06-21 MED ORDER — FENTANYL 25 MCG/HR TD PT72
75.0000 ug | MEDICATED_PATCH | TRANSDERMAL | Status: DC
  Administered 2015-06-21 – 2015-06-30 (×4): 75 ug via TRANSDERMAL
  Filled 2015-06-21: qty 3
  Filled 2015-06-21: qty 1
  Filled 2015-06-21 (×2): qty 3

## 2015-06-21 NOTE — Progress Notes (Signed)
Fentanyl drip 2,52900mcg/250 ml discontinued; 140 ml wasted in sink, witnessed by C. Dupont, Charity fundraiserN.

## 2015-06-21 NOTE — Progress Notes (Signed)
PULMONARY / CRITICAL CARE MEDICINE   Name: Carlisle CaterClay Wagman MRN: [id number] DOB: 11-24-1955    ADMISSION DATE:  06/04/2015 CONSULTATION DATE:  06/04/2015  [facility]  CHIEF COMPLAINT:  AMS  SUBJECTIVE:  May be slightly more awake and interactive, opened his eyes and may have turned to voice Back on fentanyl drip overnight due to hypertension, tachycardia, some degree of agitation  VITAL SIGNS: BP 118/78 mmHg  Pulse 86  Temp(Src) 98.9 F (37.2 C) (Rectal)  Resp 18  Ht 5\' 4"  (1.626 m)  Wt 64.9 kg (143 lb 1.3 oz)  BMI 24.55 kg/m2  SpO2 100%  VENTILATOR SETTINGS: Vent Mode:  [-] PCV FiO2 (%):  [35 %-40 %] 35 % Set Rate:  [14 bmp] 14 bmp PEEP:  [5 cmH20] 5 cmH20 Plateau Pressure:  [17 cmH20] 17 cmH20  INTAKE / OUTPUT: I/O last 3 completed shifts: In: 4615.3 [I.V.:100.3; WU/JW:1191G/GT:4465; IV Piggyback:50] Out: 2490 [Urine:2490]  PHYSICAL EXAMINATION: General: ill-appearing, ventilated Neuro: opens eyes, may have turned to voice/noise, doesn't follow commands or do anything purposeful HEENT: massive tongue swelling looks stable 3/16, trach site clean Cardiac: regular Chest: b/l rhonchi Abd: soft, non tender Ext: 1+ edema Skin: no rashes  BMET  Recent Labs Lab 06/19/15 0304 06/20/15 0430 06/21/15 0540  NA 148* 141 138  K 4.4 4.5 4.5  CL 106 104 105  CO2 29 25 24   BUN 29* 27* 31*  CREATININE 0.82 0.75 0.82  GLUCOSE 125* 121* 207*   Electrolytes  Recent Labs Lab 06/18/15 0603 06/19/15 0304 06/20/15 0430 06/21/15 0540  CALCIUM 8.5* 8.8* 8.5* 8.4*  MG 2.1 2.0  --   --   PHOS 3.6 4.0  --   --    CBC  Recent Labs Lab 06/19/15 0304 06/20/15 0430 06/21/15 0540  WBC 13.5* 14.4* 12.9*  HGB 8.7* 8.6* 8.0*  HCT 27.5* 25.4* 25.1*  PLT 604* 585* 513*   Coag's  Recent Labs Lab 06/19/15 1121  APTT 36  INR 1.06   Sepsis Markers  Recent Labs Lab 06/15/15 1300 06/16/15 0343 06/17/15 0454  PROCALCITON 1.30 0.76 0.55   ABG No  results for input(s): PHART, PCO2ART, PO2ART in the last 168 hours. Liver Enzymes  Recent Labs Lab 06/18/15 0603  AST 83*  ALT 62  ALKPHOS 68  BILITOT 0.5  ALBUMIN 1.8*   Cardiac Enzymes No results for input(s): TROPONINI, PROBNP in the last 168 hours. Glucose  Recent Labs Lab 06/20/15 1120 06/20/15 1611 06/20/15 1917 06/20/15 2303 06/21/15 0400 06/21/15 0724  GLUCAP 106* 112* 112* 98 170* 205*   Imaging Dg Chest Port 1 View  06/21/2015  CLINICAL DATA:  Acute respiratory failure EXAM: PORTABLE CHEST 1 VIEW COMPARISON:  06/19/2015 FINDINGS: Tracheostomy remains in good position.  Gastric tube in the stomach. Diffuse bilateral airspace disease right greater than left is unchanged. No effusion. Mild atelectasis in the lung bases. IMPRESSION: Bilateral airspace disease unchanged. Support lines remain in satisfactory position. Electronically Signed   By: Marlan Palauharles  Clark M.D.   On: 06/21/2015 07:47     CULTURES: BCx2 2/27 , 2/28 >ng 3/2 resp >>> strep PNA (sens) 3/8 bld >> 3/8 resp >> nml flora\ 3/15 resp >>   LINES/TUBES: ETT 2/27>>> 3/10 Trach 3/10 >>  SIGNIFICANT EVENTS: 2/27 AMS, ? Seizure, [facility] in [facility], [facility]05 cardene gtt 3/05 dantrolene stopped 3/10 change to pressure control  STUDIES:  CT head 2/27 >  No acute intracranial abnormality. Remote bilateral  basal ganglia lacunar infarcts. Apparent soft tissue fullness in the nasopharynx could relate to retained secretions, especially given absence of mastoid effusions.  MRI brain 3/3>>>acute left basal ganglia lacunar EEG 3/6 nml  DISCUSSION: 60 yo male presented to [facility] with altered mental status.  He has hx of ETOH and CVA, and reported to smoke crack prior to admission.  Concern was he had alcohol withdrawal seizure.  Intubated for airway protection. Now w trach   ASSESSMENT / PLAN:  NEUROLOGIC A:   Acute encephalopathy. 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 P:    Monitor mental status, wean fentanyl gtt to off on 3/13 Will TEMPORARILY stop ASA 3/16 given a possible contributor to angioedema. Plan restart after a few days Thiamine, folic acid completed Place fentanyl patch 3/16 and work to discontinue fentanyl drip  PULMONARY A: Acute respiratory failure in setting of seizures an HCAP. Failure to wean s/p trach, beginning some ATC on 3/13 Apparent angioedema, tongue enlargement P:   ATC as tolerated, consider PSV as rest mode (has been on PCV) F/u CXR intermittently Cause tongue enlargement unclear, will d/c potential offending meds as able > ASA on 3/16. Solumedrol and pepcid given, completed  CARDIOVASCULAR A:  HTN. P:  Continue cardizem per tube ASA on hold  RENAL A:  Hypernatremia. Rhabdomyolysis on admission >> resolved. P:   Continue free water as ordered  GASTROINTESTINAL A:   Protein calorie malnutrition. P:   Continue tube feeds IR to assess for PEG, appreciate their assistance Protonix for SUP  HEMATOLOGIC A:   Anemia of chronic disease and critical illness. Leukocytosis  P:  F/u CBC SQ heparin  INFECTIOUS A:   CAP with pneumococcus in sputum. P:   LP 3/1>>>negative Vancomycin 3/1>>>3/2 Rocephin 3/1>>>3/2>>> 3/12 Amp 3/1>>>3/2 Acycolovir 3/1>>>3/2  Completed course ceftriaxone 3/12 for pneumococcus  Repeated cx's 3/15, Pct 0.55 will recheck 3/17  ENDOCRINE A:   No acute issues.   P:   Monitor blood sugar, has not require coverage  Disposition >> case manager assessing for LTAC vs Vent-SNF  Family: discussed patient's status, active issues, overall prognosis with 2 brothers at bedside 3/16. They understand that he will likely require significant support indefinitely in order to follow to see if he can have any neurological improvement. They support aggressive care. We will plan for PEG placement. He is being evaluated for either LTAC or vent-SNF.   Independent CC time 40 minutes  Levy Pupa,  MD, PhD 06/21/2015, 9:44 AM [facility] [phone] or if no answer [phone]

## 2015-06-21 NOTE — Progress Notes (Signed)
Patient ID: Gregory Parker, male   DOB: 1955/12/08, 60 y.o.   MRN: [id number]   Following chart for appropriate timing for percutaneous gastric tube placement  T max 101.8 Wbc 12.9  Need afeb x 24 hrs or more  Likely plan for Mon 3/20 if wbc down and afeb over weekend

## 2015-06-22 LAB — GLUCOSE, CAPILLARY
GLUCOSE-CAPILLARY: 126 mg/dL — AB (ref 65–99)
GLUCOSE-CAPILLARY: 142 mg/dL — AB (ref 65–99)
GLUCOSE-CAPILLARY: 127 mg/dL — AB (ref 65–99)
Glucose-Capillary: 152 mg/dL — ABNORMAL HIGH (ref 65–99)
Glucose-Capillary: 122 mg/dL — ABNORMAL HIGH (ref 65–99)

## 2015-06-22 LAB — POCT I-STAT 3, ART BLOOD GAS (G3+)
ACID-BASE EXCESS: 3 mmol/L — AB (ref 0.0–2.0)
Bicarbonate: 26.6 mEq/L — ABNORMAL HIGH (ref 20.0–24.0)
O2 Saturation: 92 %
PH ART: 7.491 — AB (ref 7.350–7.450)
TCO2: 28 mmol/L (ref 0–100)
pCO2 arterial: 35 mmHg (ref 35.0–45.0)
pO2, Arterial: 58 mmHg — ABNORMAL LOW (ref 80.0–100.0)

## 2015-06-22 LAB — PROCALCITONIN: PROCALCITONIN: 0.28 ng/mL

## 2015-06-22 LAB — CULTURE, RESPIRATORY W GRAM STAIN: Culture: NORMAL

## 2015-06-22 LAB — CULTURE, RESPIRATORY

## 2015-06-22 MED ORDER — HEPARIN SODIUM (PORCINE) 5000 UNIT/ML IJ SOLN
5000.0000 [IU] | Freq: Three times a day (TID) | INTRAMUSCULAR | Status: DC
  Administered 2015-06-22 – 2015-06-25 (×8): 5000 [IU] via SUBCUTANEOUS
  Filled 2015-06-22 (×8): qty 1

## 2015-06-22 MED ORDER — FENTANYL CITRATE (PF) 100 MCG/2ML IJ SOLN
25.0000 ug | INTRAMUSCULAR | Status: DC | PRN
  Administered 2015-06-22 – 2015-06-24 (×13): 25 ug via INTRAVENOUS
  Filled 2015-06-22 (×13): qty 2

## 2015-06-22 MED ORDER — AMLODIPINE BESYLATE 5 MG PO TABS
5.0000 mg | ORAL_TABLET | Freq: Every day | ORAL | Status: DC
  Administered 2015-06-22 – 2015-07-01 (×10): 5 mg
  Filled 2015-06-22 (×10): qty 1

## 2015-06-22 NOTE — Progress Notes (Signed)
Patient ID: Gregory Parker, male   DOB: 13-May-1955, 60 y.o.   MRN: [id number]   Plan for percutaneous gastric tube in IR 3/20  See orders for same

## 2015-06-22 NOTE — Progress Notes (Signed)
Nutrition Follow-up  DOCUMENTATION CODES:   Severe malnutrition in context of chronic illness  INTERVENTION:   Continue Vital AF 1.2 @ 70 ml/hr Provides: 2016 kcal (104% of needs), 126 grams protein, and 1362 ml H2O.  Total free water: 2562 ml  NUTRITION DIAGNOSIS:   Malnutrition related to chronic illness as evidenced by severe depletion of body fat, severe depletion of muscle mass. Ongoing.   GOAL:   Patient will meet greater than or equal to 90% of their needs Met.   MONITOR:   TF tolerance, I & O's, Weight trends  ASSESSMENT:   Pt with etoh and drug abuse history presenting with seizures and acute CVA.  3/10 trach placed 3/16 pt now on trach collar only Plan for IR to place PEG Monday if fever still down.  Medications reviewed and include: MVI Free water: 200 ml every 4 hours = 1200 ml CBG's: 126-152  Diet Order:  Diet NPO time specified Diet NPO time specified Except for: Sips with Meds  Skin:  Reviewed, no issues  Last BM:  3/16  Height:   Ht Readings from Last 1 Encounters:  06/04/15 '5\' 4"'$  (1.626 m)    Weight:   Wt Readings from Last 1 Encounters:  06/22/15 137 lb 5.6 oz (62.3 kg)    Ideal Body Weight:  59 kg  BMI:  Body mass index is 23.56 kg/(m^2).  Estimated Nutritional Needs:   Kcal:  1900-2100  Protein:  95-120 grams  Fluid:  > 1.6 L/day  EDUCATION NEEDS:   No education needs identified at this time  [place], [place], [place] Pager [phone] After Hours Pager

## 2015-06-22 NOTE — Progress Notes (Signed)
PULMONARY / CRITICAL CARE MEDICINE   Name: Gregory Parker MRN: [id number] DOB: 04-16-1955    ADMISSION DATE:  06/04/2015 CONSULTATION DATE:  06/04/2015  REFERRING MD:  [facility]  CHIEF COMPLAINT:  AMS  SUBJECTIVE:  Was on ATC 24h, didn't go back to MV last night  VITAL SIGNS: BP 176/79 mmHg  Pulse 89  Temp(Src) 98.6 F (37 C) (Oral)  Resp 27  Ht  (1.626 m)  Wt 62.3 kg (137 lb 5.6 oz)  BMI 23.56 kg/m2  SpO2 95%  VENTILATOR SETTINGS: Vent Mode:  [-]  FiO2 (%):  [35 %-40 %] 40 %  INTAKE / OUTPUT: I/O last 3 completed shifts: In: 3417.8 [I.V.:137.8; NG/GT:3180; IV Piggyback:100] Out: 2390 [Urine:2390]  PHYSICAL EXAMINATION: General: ill-appearing, ventilated Neuro: opens eyes, may have turned to voice/noise, doesn't follow commands or do anything purposeful HEENT: massive tongue swelling looks stable 3/16, trach site clean Cardiac: regular Chest: b/l rhonchi Abd: soft, non tender Ext: 1+ edema Skin: no rashes  BMET  Recent Labs Lab 06/19/15 0304 06/20/15 0430 06/21/15 0540  NA 148* 141 138  K 4.4 4.5 4.5  CL 106 104 105  CO2 BUN 29* 27* 31*  CREATININE 0.82 0.75 0.82  GLUCOSE 125* 121* 207*   Electrolytes  Recent Labs Lab 06/18/15 0603 06/19/15 0304 06/20/15 0430 06/21/15 0540  CALCIUM 8.5* 8.8* 8.5* 8.4*  MG 2.1 2.0  --   --   PHOS 3.6 4.0  --   --    CBC  Recent Labs Lab 06/19/15 0304 06/20/15 0430 06/21/15 0540  WBC 13.5* 14.4* 12.9*  HGB 8.7* 8.6* 8.0*  HCT 27.5* 25.4* 25.1*  PLT 604* 585* 513*   Coag's  Recent Labs Lab 06/19/15 1121  APTT 36  INR 1.06   Sepsis Markers  Recent Labs Lab 06/16/15 0343 06/17/15 0454 06/22/15 0545  PROCALCITON 0.76 0.55 0.28   ABG No results for input(s): PHART, PCO2ART, PO2ART in the last 168 hours. Liver Enzymes  Recent Labs Lab 06/18/15 0603  AST 83*  ALT 62  ALKPHOS 68  BILITOT 0.5  ALBUMIN 1.8*   Cardiac Enzymes No results for input(s): TROPONINI,  PROBNP in the last 168 hours. Glucose  Recent Labs Lab 06/21/15 1133 06/21/15 1614 06/21/15 1912 06/21/15 2348 06/22/15 0322 06/22/15 0821  GLUCAP 208* 154* 150* 135* 152* 126*   Imaging No results found.   CULTURES: BCx2 2/27 , 2/28 >ng 3/2 resp >>> strep PNA (sens) 3/8 bld >> 3/8 resp >> nml flora\ 3/15 resp >>  3/15 blood >> 1 of 2 GPC >>  3/15 urine >> negative  LINES/TUBES: ETT 2/27>>> 3/10 Trach 3/10 >>  SIGNIFICANT EVENTS: 2/27 AMS, ? Seizure, intubated in [facility], tx to [facility] ICU 3/05 cardene gtt 3/05 dantrolene stopped 3/10 change to pressure control  STUDIES:  CT head 2/27 >  No acute intracranial abnormality. Remote bilateral basal ganglia lacunar infarcts. Apparent soft tissue fullness in the nasopharynx could relate to retained secretions, especially given absence of mastoid effusions.  MRI brain 3/3>>>acute left basal ganglia lacunar EEG 3/6 nml  DISCUSSION: 60 yo male presented to [facility] with altered mental status.  He has hx of ETOH and CVA, and reported to smoke crack prior to admission.  Concern was he had alcohol withdrawal seizure.  Intubated for airway protection. Now w trach   ASSESSMENT / PLAN:  NEUROLOGIC A:   Acute encephalopathy. 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 P:   Monitor mental  status, wean fentanyl gtt to off on 3/13 Will TEMPORARILY stop ASA 3/16 given a possible contributor to angioedema on 3/16 Thiamine, folic acid completed Placed fentanyl patch 3/16, fentanyl gtt off  PULMONARY A: Acute respiratory failure in setting of seizures an HCAP. Failure to wean s/p trach, beginning some ATC on 3/13 Apparent angioedema, tongue enlargement P:   ATC as tolerated, Check ABG 3/17 to assess status off MV F/u CXR intermittently Cause tongue enlargement unclear, will d/c potential offending meds as able > ASA on 3/16. Solumedrol and pepcid given, completed  CARDIOVASCULAR A:  HTN. P:  Continue cardizem per  tube ASA on hold  RENAL A:  Hypernatremia. Rhabdomyolysis on admission >> resolved. P:   Continue free water as ordered  GASTROINTESTINAL A:   Protein calorie malnutrition. P:   Continue tube feeds IR to assess for PEG, appreciate their assistance, possibly Monday Protonix for SUP  HEMATOLOGIC A:   Anemia of chronic disease and critical illness. Leukocytosis  P:  F/u CBC SQ heparin  INFECTIOUS A:   CAP with pneumococcus in sputum. GPC on blood cx 1 of 2, ? contaminant P:   LP 3/1>>>negative Vancomycin 3/1>>>3/2 Rocephin 3/1>>>3/2>>> 3/12 Amp 3/1>>>3/2 Acyclovir 3/1>>>3/2  Completed course ceftriaxone 3/12 for pneumococcus  Repeated cx's 3/15, note 1 of 2 GPC blood cx. With dropping Pct will defer abx, follow   ENDOCRINE A:   No acute issues.   P:   Monitor blood sugar, has not required coverage  Disposition >> case manager assessing for LTAC vs SNF  Family: discussed patient's status, active issues, overall prognosis with 2 brothers at bedside 3/16. They understand that he will likely require significant support indefinitely in order to follow to see if he can have any neurological improvement. They support aggressive care. We will plan for PEG placement. He is being evaluated for either LTAC or SNF    Levy Pupaobert Shaunda Tipping, MD, PhD 06/22/2015, 11:16 AM [facility] [phone] or if no answer [phone]

## 2015-06-23 ENCOUNTER — Inpatient Hospital Stay (HOSPITAL_COMMUNITY): Payer: Medicare Other

## 2015-06-23 LAB — CULTURE, BLOOD (ROUTINE X 2)

## 2015-06-23 LAB — GLUCOSE, CAPILLARY
GLUCOSE-CAPILLARY: 132 mg/dL — AB (ref 65–99)
GLUCOSE-CAPILLARY: 106 mg/dL — AB (ref 65–99)
GLUCOSE-CAPILLARY: 156 mg/dL — AB (ref 65–99)
Glucose-Capillary: 121 mg/dL — ABNORMAL HIGH (ref 65–99)
Glucose-Capillary: 108 mg/dL — ABNORMAL HIGH (ref 65–99)
Glucose-Capillary: 116 mg/dL — ABNORMAL HIGH (ref 65–99)

## 2015-06-23 LAB — PROCALCITONIN: PROCALCITONIN: 0.21 ng/mL

## 2015-06-23 MED ORDER — FAMOTIDINE IN NACL 20-0.9 MG/50ML-% IV SOLN
20.0000 mg | INTRAVENOUS | Status: DC
  Administered 2015-06-23 – 2015-07-01 (×9): 20 mg via INTRAVENOUS
  Filled 2015-06-23 (×9): qty 50

## 2015-06-23 MED ORDER — CETYLPYRIDINIUM CHLORIDE 0.05 % MT LIQD: 7.0000 mL | Freq: Two times a day (BID) | OROMUCOSAL | Status: DC

## 2015-06-23 MED ORDER — LEVALBUTEROL HCL 0.63 MG/3ML IN NEBU
0.6300 mg | INHALATION_SOLUTION | Freq: Four times a day (QID) | RESPIRATORY_TRACT | Status: DC
Start: 2015-06-23 — End: 2015-07-12
  Administered 2015-06-23 – 2015-07-12 (×75): 0.63 mg via RESPIRATORY_TRACT
  Filled 2015-06-23 (×76): qty 3

## 2015-06-23 MED ORDER — METHYLPREDNISOLONE SODIUM SUCC 125 MG IJ SOLR
60.0000 mg | INTRAMUSCULAR | Status: DC
  Administered 2015-06-23 – 2015-06-29 (×7): 60 mg via INTRAVENOUS
  Filled 2015-06-23 (×7): qty 2

## 2015-06-23 MED ORDER — ANTISEPTIC ORAL RINSE SOLUTION (CORINZ)
7.0000 mL | Freq: Four times a day (QID) | OROMUCOSAL | Status: DC
  Administered 2015-06-24 – 2015-06-27 (×6): 7 mL via OROMUCOSAL

## 2015-06-23 MED ORDER — CEFAZOLIN SODIUM-DEXTROSE 2-3 GM-% IV SOLR
2.0000 g | INTRAVENOUS | Status: AC
  Administered 2015-06-25: 2 g via INTRAVENOUS
  Filled 2015-06-23 (×2): qty 50

## 2015-06-23 MED ORDER — CHLORHEXIDINE GLUCONATE 0.12 % MT SOLN: 15.0000 mL | Freq: Two times a day (BID) | OROMUCOSAL | Status: DC

## 2015-06-23 NOTE — Progress Notes (Signed)
Pt tongue swelling is 6 cm in length. Will cont to monitor closely.

## 2015-06-23 NOTE — Progress Notes (Signed)
eLink Physician-Brief Progress Note Patient Name: Carlisle CaterClay Brewbaker DOB: Jan 29, 1956 MRN: [id number]   Date of Service  06/23/2015  HPI/Events of Note  Camera check on patient with tongue swelling. Elink nurse reports that swelling worse since Tuesday. Question possibly from [facility].  eICU Interventions  Restart Solu-Medrol & Pepcid IV.     Intervention Category Intermediate Interventions: Other:  Lawanda CousinsJennings Londynn Sonoda 06/23/2015, 4:52 PM

## 2015-06-23 NOTE — Progress Notes (Signed)
PULMONARY / CRITICAL CARE MEDICINE   Name: Carlisle CaterClay Plamondon MRN: [id number] DOB: 1956-02-19    ADMISSION DATE:  06/04/2015 CONSULTATION DATE:  06/04/2015  [facility]  CHIEF COMPLAINT:  AMS  SUBJECTIVE:  Has tolerated ATC last 48h  VITAL SIGNS: BP 165/66 mmHg  Pulse 92  Temp(Src) 97.7 F (36.5 C) (Axillary)  Resp 19  Ht 5\' 4"  (1.626 m)  Wt 64.9 kg (143 lb 1.3 oz)  BMI 24.55 kg/m2  SpO2 97%  VENTILATOR SETTINGS: Vent Mode:  [-]  FiO2 (%):  [40 %] 40 %  INTAKE / OUTPUT: I/O last 3 completed shifts: In: 3550 [NG/GT:3550] Out: 3300 [Urine:3300]  PHYSICAL EXAMINATION: General: ill-appearing, ventilated Neuro: opens eyes, may have turned to voice/noise, doesn't follow commands or do anything purposeful HEENT: massive tongue swelling looks stable 3/16, trach site clean Cardiac: regular Chest: b/l rhonchi Abd: soft, non tender Ext: 1+ edema Skin: no rashes  BMET  Recent Labs Lab 06/19/15 0304 06/20/15 0430 06/21/15 0540  NA 148* 141 138  K 4.4 4.5 4.5  CL 106 104 105  CO2 29 25 24   BUN 29* 27* 31*  CREATININE 0.82 0.75 0.82  GLUCOSE 125* 121* 207*   Electrolytes  Recent Labs Lab 06/18/15 0603 06/19/15 0304 06/20/15 0430 06/21/15 0540  CALCIUM 8.5* 8.8* 8.5* 8.4*  MG 2.1 2.0  --   --   PHOS 3.6 4.0  --   --    CBC  Recent Labs Lab 06/19/15 0304 06/20/15 0430 06/21/15 0540  WBC 13.5* 14.4* 12.9*  HGB 8.7* 8.6* 8.0*  HCT 27.5* 25.4* 25.1*  PLT 604* 585* 513*   Coag's  Recent Labs Lab 06/19/15 1121  APTT 36  INR 1.06   Sepsis Markers  Recent Labs Lab 06/17/15 0454 06/22/15 0545 06/23/15 0404  PROCALCITON 0.55 0.28 0.21   ABG  Recent Labs Lab 06/22/15 1202  PHART 7.491*  PCO2ART 35.0  PO2ART 58.0*   Liver Enzymes  Recent Labs Lab 06/18/15 0603  AST 83*  ALT 62  ALKPHOS 68  BILITOT 0.5  ALBUMIN 1.8*   Cardiac Enzymes No results for input(s): TROPONINI, PROBNP in the last 168  hours. Glucose  Recent Labs Lab 06/22/15 1211 06/22/15 1610 06/22/15 1931 06/22/15 2308 06/23/15 0316 06/23/15 0731  GLUCAP 142* 127* 122* 132* 121* 108*   Imaging No results found.   CULTURES: BCx2 2/27 , 2/28 >ng 3/2 resp >>> strep PNA (sens) 3/8 bld >> negative 3/8 resp >> nml flora\ 3/15 resp >> normal flora 3/15 blood >> 1 of 2 GPC >>  3/15 urine >> negative  LINES/TUBES: ETT 2/27>>> 3/10 Trach 3/10 >>  SIGNIFICANT EVENTS: 2/27 AMS, ? Seizure, intubated in [facility] ED, [facility]05 cardene gtt 3/05 dantrolene stopped 3/10 change to pressure control  STUDIES:  CT head 2/27 >  No acute intracranial abnormality. Remote bilateral basal ganglia lacunar infarcts. Apparent soft tissue fullness in the nasopharynx could relate to retained secretions, especially given absence of mastoid effusions.  MRI brain 3/3>>>acute left basal ganglia lacunar EEG 3/6 nml  DISCUSSION: 60 yo male presented to [facility] with altered mental status.  He has hx of ETOH and CVA, and reported to smoke crack prior to admission.  Concern was he had alcohol withdrawal seizure.  Intubated for airway protection. Now w trach   ASSESSMENT / PLAN:  NEUROLOGIC A:   Acute encephalopathy. 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 P:   Monitor mental status Will TEMPORARILY  stop ASA 3/16 given a possible contributor to angioedema Thiamine, folic acid completed Placed fentanyl patch 3/16, fentanyl gtt off  PULMONARY A: Acute respiratory failure in setting of seizures an HCAP. Failure to wean s/p trach, beginning some ATC on 3/13 Apparent angioedema, tongue enlargement P:   ATC as tolerated, F/u CXR intermittently Cause tongue enlargement unclear, will d/c potential offending meds as able > ASA on 3/16. Solumedrol and pepcid given, completed  CARDIOVASCULAR A:  HTN. P:  Continue cardizem per tube ASA on hold  RENAL A:  Hypernatremia. Rhabdomyolysis on admission >>  resolved. P:   Continue free water as ordered  GASTROINTESTINAL A:   Protein calorie malnutrition. P:   Continue tube feeds IR to assess for PEG, appreciate their assistance, possibly Monday Protonix for SUP  HEMATOLOGIC A:   Anemia of chronic disease and critical illness. Leukocytosis  P:  F/u CBC SQ heparin  INFECTIOUS A:   CAP with pneumococcus in sputum. GPC on blood cx 1 of 2, ? contaminant P:   LP 3/1>>>negative Vancomycin 3/1>>>3/2 Rocephin 3/1>>>3/2>>> 3/12 Amp 3/1>>>3/2 Acyclovir 3/1>>>3/2  Completed course ceftriaxone 3/12 for pneumococcus  Repeated cx's 3/15, note 1 of 2 GPC blood cx. With dropping Pct will defer abx, follow   ENDOCRINE A:   No acute issues.   P:   Monitor blood sugar, has not required coverage  Disposition >> case manager assessing for LTAC vs SNF  Family: discussed patient's status, active issues, overall prognosis with 2 brothers at bedside 3/16. They understand that he will likely require significant support indefinitely in order to follow to see if he can have any neurological improvement. They support aggressive care. We will plan for PEG placement. He is being evaluated for either LTAC or SNF  To SDU bed.   Levy Pupa, MD, PhD 06/23/2015, 12:01 PM [place] [phone] or if no answer [phone]

## 2015-06-23 NOTE — Progress Notes (Signed)
Patient arrived to [facility] from 21M on trach collar 10L/min, NG tube running 4270ml/hr. Pepcid was complete, IV converted to SL. Vitals obtained and charted. Patient bathed with CHG wipes. Condom cath placement is secure and intact. SCDs are applied on functioning. Suction setup at bedside along with necessary trach supplies. Patient transferred with cooling blanket and machine. Upon arrival patients mouth suctioned, face and mouth washed with wet washcloth. Patients tongue and lips are very swollen/cracked.

## 2015-06-23 NOTE — Progress Notes (Signed)
eLink Physician-Brief Progress Note Patient Name: Carlisle CaterClay Kallen DOB: July 29, 1955 MRN: [id number]   Date of Service  06/23/2015  HPI/Events of Note  Notified By bedside nurse of increasing volume of secretions. Patient currently intubated on ventilator.Requiring frequent suctioning.She also reports low-grade temperature at this time.  eICU Interventions  1. Stat portable chest x-ray to evaluate for possible infiltrate 2. Tracheal aspirate culture 3. Holding on antibiotics 4. Xopenex neb every 6 hours     Intervention Category Intermediate Interventions: Other:;Respiratory distress - evaluation and management  Lawanda CousinsJennings Hania Cerone 06/23/2015, 3:34 PM

## 2015-06-24 LAB — GLUCOSE, CAPILLARY
GLUCOSE-CAPILLARY: 121 mg/dL — AB (ref 65–99)
Glucose-Capillary: 136 mg/dL — ABNORMAL HIGH (ref 65–99)
Glucose-Capillary: 150 mg/dL — ABNORMAL HIGH (ref 65–99)
Glucose-Capillary: 182 mg/dL — ABNORMAL HIGH (ref 65–99)
Glucose-Capillary: 194 mg/dL — ABNORMAL HIGH (ref 65–99)

## 2015-06-24 MED ORDER — FENTANYL CITRATE (PF) 100 MCG/2ML IJ SOLN
50.0000 ug | INTRAMUSCULAR | Status: DC | PRN
  Administered 2015-06-24 – 2015-07-06 (×19): 50 ug via INTRAVENOUS
  Filled 2015-06-24 (×22): qty 2

## 2015-06-24 NOTE — Progress Notes (Signed)
PULMONARY / CRITICAL CARE MEDICINE   Name: Gregory Parker MRN: [id number] DOB: 1956/01/22    ADMISSION DATE:  06/04/2015 CONSULTATION DATE:  06/04/2015  REFERRING MD:  [facility]  CHIEF COMPLAINT:  AMS  SUBJECTIVE:  Now on ATC 24x7 Tongue a bit smaller  VITAL SIGNS: BP 158/82 mmHg  Pulse 118  Temp(Src) 99.2 F (37.3 C) (Oral)  Resp 30  Ht  (1.651 m)  Wt 66 kg (145 lb 8.1 oz)  BMI 24.21 kg/m2  SpO2 93%  VENTILATOR SETTINGS: Vent Mode:  [-]  FiO2 (%):  [40 %] 40 %  INTAKE / OUTPUT: I/O last 3 completed shifts: In: 3420 [NG/GT:3420] Out: 2681 [Urine:2680; Stool:1]  PHYSICAL EXAMINATION: General: ill-appearing, ventilated Neuro: opens eyes, may have turned to voice/noise, doesn't follow commands or do anything purposeful HEENT: massive tongue swelling looks stable 3/16, trach site clean Cardiac: regular Chest: b/l rhonchi Abd: soft, non tender Ext: 1+ edema Skin: no rashes  BMET  Recent Labs Lab 06/19/15 0304 06/20/15 0430 06/21/15 0540  NA 148* 141 138  K 4.4 4.5 4.5  CL 106 104 105  CO2 BUN 29* 27* 31*  CREATININE 0.82 0.75 0.82  GLUCOSE 125* 121* 207*   Electrolytes  Recent Labs Lab 06/18/15 0603 06/19/15 0304 06/20/15 0430 06/21/15 0540  CALCIUM 8.5* 8.8* 8.5* 8.4*  MG 2.1 2.0  --   --   PHOS 3.6 4.0  --   --    CBC  Recent Labs Lab 06/19/15 0304 06/20/15 0430 06/21/15 0540  WBC 13.5* 14.4* 12.9*  HGB 8.7* 8.6* 8.0*  HCT 27.5* 25.4* 25.1*  PLT 604* 585* 513*   Coag's  Recent Labs Lab 06/19/15 1121  APTT 36  INR 1.06   Sepsis Markers  Recent Labs Lab 06/22/15 0545 06/23/15 0404  PROCALCITON 0.28 0.21   ABG  Recent Labs Lab 06/22/15 1202  PHART 7.491*  PCO2ART 35.0  PO2ART 58.0*   Liver Enzymes  Recent Labs Lab 06/18/15 0603  AST 83*  ALT 62  ALKPHOS 68  BILITOT 0.5  ALBUMIN 1.8*   Cardiac Enzymes No results for input(s): TROPONINI, PROBNP in the last 168  hours. Glucose  Recent Labs Lab 06/23/15 1158 06/23/15 1556 06/23/15 2025 06/24/15 0012 06/24/15 0341 06/24/15 0723  GLUCAP 116* 106* 156* 182* 194* 150*   Imaging No results found.   CULTURES: BCx2 2/27 , 2/28 >ng 3/2 resp >>> strep PNA (sens) 3/8 bld >> negative 3/8 resp >> nml flora\ 3/15 resp >> normal flora 3/15 blood >> 1 of 2 GPC >>  3/15 urine >> negative  LINES/TUBES: ETT 2/27>>> 3/10 Trach 3/10 >>  SIGNIFICANT EVENTS: 2/27 AMS, ? Seizure, intubated in [facility] ED, tx to [facility] ICU 3/05 cardene gtt 3/05 dantrolene stopped 3/10 change to pressure control  STUDIES:  CT head 2/27 >  No acute intracranial abnormality. Remote bilateral basal ganglia lacunar infarcts. Apparent soft tissue fullness in the nasopharynx could relate to retained secretions, especially given absence of mastoid effusions.  MRI brain 3/3>>>acute left basal ganglia lacunar EEG 3/6 nml  DISCUSSION: 60 yo male presented to [facility] with altered mental status.  He has hx of ETOH and CVA, and reported to smoke crack prior to admission.  Concern was he had alcohol withdrawal seizure.  Intubated for airway protection. Now w trach   ASSESSMENT / PLAN:  NEUROLOGIC A:   Acute encephalopathy. 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 P:   Monitor mental status Will  TEMPORARILY stop ASA 3/16 given a possible contributor to angioedema, consider restart 3/20 Thiamine, folic acid completed Placed fentanyl patch 3/16, fentanyl gtt off  PULMONARY A: Acute respiratory failure in setting of seizures an HCAP. Failure to wean s/p trach, beginning some ATC on 3/13 Apparent angioedema, tongue enlargement P:   ATC as tolerated, F/u CXR intermittently Cause tongue enlargement unclear, will d/c potential offending meds as able > ASA on 3/16. ? Chlorhexadine.  Solumedrol and pepcid given, completed  CARDIOVASCULAR A:  HTN. P:  Continue cardizem per tube ASA on hold  RENAL A:   Hypernatremia. Rhabdomyolysis on admission >> resolved. P:   Continue free water as ordered  GASTROINTESTINAL A:   Protein calorie malnutrition. P:   Continue tube feeds IR to assess for PEG, appreciate their assistance, possibly Monday Protonix for SUP  HEMATOLOGIC A:   Anemia of chronic disease and critical illness. Leukocytosis  P:  F/u CBC SQ heparin  INFECTIOUS A:   CAP with pneumococcus in sputum. GPC on blood cx 1 of 2, ? contaminant P:   LP 3/1>>>negative Vancomycin 3/1>>>3/2 Rocephin 3/1>>>3/2>>> 3/12 Amp 3/1>>>3/2 Acyclovir 3/1>>>3/2  Completed course ceftriaxone 3/12 for pneumococcus  Repeated cx's 3/15, note 1 of 2 GPC blood cx. With dropping Pct will defer abx, follow   ENDOCRINE A:   No acute issues.   P:   Monitor blood sugar, has not required coverage  Disposition >> case manager assessing for LTAC vs SNF  Family: discussed patient's status, active issues, overall prognosis with 2 brothers at bedside 3/16. They understand that he will likely require significant support indefinitely in order to follow to see if he can have any neurological improvement. They support aggressive care. We will plan for PEG placement. He is being evaluated for either LTAC or SNF  To SDU bed. To [facility] once he moves  Levy Pupaobert Julyanna Scholle, MD, PhD 06/24/2015, 5:18 PM [place] [facility] [phone] or if no answer [phone]

## 2015-06-24 NOTE — Progress Notes (Signed)
Patient has been profusely diaphoretic this AM, T 100.2. Temp in room lowered, blankets removed. Patient cooled and cleaned with wet washcloth. Patient tachypnic, suctioned with copious amounts of secretions from trach. Will continue to monitor closely.

## 2015-06-24 NOTE — H&P (Signed)
Chief Complaint: Dysphagia Malnutrition  Referring Physician(s): Leslye Peer   Supervising Physician: Richarda Overlie  History of Present Illness: Gregory Parker is a 60 y.o. male with history significant for HTN, ETOH, and CVA.   On 2/27 EMS was dispatched to the patient's home due to reports of shaking episodes with decreased responsiveness.   He has a history of ETOH and crack cocaine abuse.  In the emergency department, staff noted urinary incontinence, leftward gaze deviation, R facial droop, and posturing on the L side.   He then had seizure like activity with limited airway protection, and thus was intubated.   CT of the head which showed, no acute abnormality, but remote basal ganglia infarcts.  MRI shows Left basal ganglia small infarct.  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  He needs a gastrostomy tube placed for nutrition.   Past Medical History  Diagnosis Date  . Alcohol abuse   . Stroke [facility])     History reviewed. No pertinent past surgical history.  Allergies: Review of patient's allergies indicates no known allergies.  Medications: Prior to Admission medications   Medication Sig Start Date End Date Taking? Authorizing Provider  amLODipine (NORVASC) 5 MG tablet Take 5 mg by mouth daily.   Yes Historical Provider, MD  aspirin EC 81 MG tablet Take 81 mg by mouth daily.   Yes Historical Provider, MD  Cholecalciferol (VITAMIN D) 2000 units tablet Take 2,000 Units by mouth daily.   Yes Historical Provider, MD     No family history on file.  Social History   Social History  . Marital Status: Unknown    Spouse Name: N/A  . Number of Children: N/A  . Years of Education: N/A   Social History Main Topics  . Smoking status: Unknown If Ever Smoked  . Smokeless tobacco: None  . Alcohol Use: Yes     Comment: heavily at times    . Drug Use: Yes     Comment: crack  . Sexual Activity: Not Asked   Other Topics Concern  . None   Social History Narrative    Review of Systems: A 12 point ROS  Review of Systems  Unable to perform ROS: Mental status change    Vital Signs: BP 122/68 mmHg  Pulse 96  Temp(Src) 100.2 F (37.9 C) (Axillary)  Resp 18  Ht  (1.651 m)  Wt 145 lb 8.1 oz (66 kg)  BMI 24.21 kg/m2  SpO2 95%  Physical Exam  HENT:  Head: Atraumatic.  Neck:  Tracheostomy with collar  Cardiovascular: Normal rate, regular rhythm and normal heart sounds.   Pulmonary/Chest:  Coarse breath sounds bilaterally  Abdominal: Soft. Bowel sounds are normal.  No scars  Neurological:  Unarousable. S/p left basal ganglia infarct  Vitals reviewed.   Mallampati Score:  MD Evaluation Airway: Other (comments) Airway comments: Trach collar Heart: WNL Abdomen: WNL Chest/ Lungs: Other (comments) Chest/ lungs comments: Coarse rhonchi bilaterally Other Pertinent Findings: Angioedema with extremely edematous tongue ASA  Classification: 3 Mallampati/Airway Score:  (Trach collar)  Imaging: Ct Abdomen Wo Contrast  06/20/2015  CLINICAL DATA:  60 year old male with a history of dysphagia. EXAM: CT ABDOMEN WITHOUT CONTRAST TECHNIQUE: Multidetector CT imaging of the abdomen was performed following the standard protocol without IV contrast. COMPARISON:  None. FINDINGS: Lower chest: Edema/ anasarca of the lower chest wall soft tissues. Adenopathy along the  left chest wall. Minimal gynecomastia. Bilateral small pleural effusions with associated atelectasis. Gastric tube within the esophagus, terminating within the stomach. Heart size within normal limits without significant pericardial fluid/ thickening. Abdomen/pelvis: Unremarkable appearance of the liver. Gallbladder is contracted with no radiopaque stones. No pericholecystic fluid. Unremarkable spleen. Unremarkable bilateral adrenal glands. Unremarkable pancreas.  Visualized colon and small bowel unremarkable, with no abnormal distention. Similar to the chest wall there is vague infiltration of the abdominal mesenteric. No significant ascites. No hydronephrosis or nephrolithiasis of the visualized kidneys. Hypodense structure or on the lateral cortex of the right kidney and the inferior cortex of the right kidney, both with low Hounsfield units. Superior measures 1.5 cm. Inferior measures 1.4 cm. Scattered vascular calcifications. Degenerative changes of the spine. No acute fracture. Multiple endplate irregularities compatible with Schmorl's nodes. Vacuum disc phenomenon of the mid lumbar spine. No significant bony canal narrowing. IMPRESSION: No acute intra abdominal process. Bilateral pleural effusions and atelectasis. Body wall and abdominal edema/ anasarca. Recommend correlation with fluid status. Right-sided low-density kidney lesions, most likely benign cysts though not fully characterized by noncontrast CT. Signed, Yvone Neu. Loreta Ave, DO [facility] Electronically Signed   By: Gilmer Mor D.O.   On: 06/20/2015 07:07   Ct Angio Head W/cm &/or Wo Cm  06/05/2015  CLINICAL DATA:  Patient found unresponsive. History of hypertension, crack cocaine, and ETOH abuse. Right-sided weakness. EXAM: CT ANGIOGRAPHY HEAD AND NECK TECHNIQUE: Multidetector CT imaging of the head and neck was performed using the standard protocol during bolus administration of intravenous contrast. Multiplanar CT image reconstructions and MIPs were obtained to evaluate the vascular anatomy. Carotid stenosis measurements (when applicable) are obtained utilizing NASCET criteria, using the distal internal carotid diameter as the denominator. CONTRAST:  50mL OMNIPAQUE IOHEXOL 350 MG/ML SOLN COMPARISON:  MR brain earlier today showed acute LEFT basal ganglia and periventricular white matter infarct. FINDINGS: CT HEAD Calvarium and skull base: No  fracture or destructive lesion. Mastoids and middle ears are grossly clear. Paranasal sinuses: Imaged portions are clear. Orbits: Negative. Brain: Developing cytotoxic edema, LEFT basal ganglia and periventricular white matter, corresponding to restricted diffusion on MRI. No hemorrhagic transformation. Advanced atrophy with chronic microvascular ischemic change. Large area of remote infarction RIGHT basal ganglia. Smaller LEFT cerebellar infarct. CTA NECK Aortic arch: Bovine trunk. Imaged portion shows no evidence of aneurysm or dissection. No significant stenosis of the major arch vessel origins. Minor atheromatous change LEFT subclavian origin. Right carotid system: No evidence of dissection, stenosis (50% or greater) or occlusion. Left carotid system: No evidence of dissection, stenosis (50% or greater) or occlusion. Vertebral arteries: LEFT vertebral dominant. No evidence of dissection, stenosis (50% or greater) or occlusion. Nonvascular soft tissues: Spondylosis. No lung apex lesion. Intubated. Orogastric tube. No neck masses. CTA HEAD Anterior circulation: No significant stenosis, proximal occlusion, aneurysm, or vascular malformation. Posterior circulation: No significant stenosis, proximal occlusion, aneurysm, or vascular malformation. Venous sinuses: As permitted by contrast timing, patent. Anatomic variants: None of significance. Delayed phase:   No abnormal intracranial enhancement. IMPRESSION: No extracranial or intracranial stenosis of significance to explain the patient's acute LEFT basal ganglia infarct. Specifically no LEFT MCA dissection or thrombus. Chronic changes as described. Developing cytotoxic edema LEFT basal ganglia and white matter corresponding to restricted diffusion on MR. No hemorrhagic transformation. Electronically Signed   By: Elsie Stain M.D.   On: 06/05/2015 12:23   Ct Head Wo Contrast  06/04/2015  CLINICAL DATA:  Altered mental status. Shaking  and decreased responsiveness.  History of alcohol abuse and stroke. EXAM: CT HEAD WITHOUT CONTRAST TECHNIQUE: Contiguous axial images were obtained from the base of the skull through the vertex without intravenous contrast. COMPARISON:  None. FINDINGS: Sinuses/Soft tissues: Surgical changes about the left maxillary sinus and zygoma, incompletely imaged. Apparent soft tissue fullness within the nasopharynx, including image 1/series 3. Hypoplastic frontal sinuses. Other paranasal sinuses and mastoid air cells are unremarkable. Intracranial: Age advanced cerebellar atrophy. Remote lacunar infarcts involving the left lateral basal ganglia, right thalamus and caudate. No mass lesion, hemorrhage, hydrocephalus, acute infarct, intra-axial, or extra-axial fluid collection. IMPRESSION: 1.  No acute intracranial abnormality. 2. Remote bilateral basal ganglia lacunar infarcts. 3. Apparent soft tissue fullness in the nasopharynx could relate to retained secretions, especially given absence of mastoid effusions. Consider physical exam correlation to exclude unlikely soft tissue mass in this area. Electronically Signed   By: Jeronimo GreavesKyle  Talbot M.D.   On: 06/04/2015 18:19   Ct Angio Neck W/cm &/or Wo/cm  06/05/2015  CLINICAL DATA:  Patient found unresponsive. History of hypertension, crack cocaine, and ETOH abuse. Right-sided weakness. EXAM: CT ANGIOGRAPHY HEAD AND NECK TECHNIQUE: Multidetector CT imaging of the head and neck was performed using the standard protocol during bolus administration of intravenous contrast. Multiplanar CT image reconstructions and MIPs were obtained to evaluate the vascular anatomy. Carotid stenosis measurements (when applicable) are obtained utilizing NASCET criteria, using the distal internal carotid diameter as the denominator. CONTRAST:  50mL OMNIPAQUE IOHEXOL 350 MG/ML SOLN COMPARISON:  MR brain earlier today showed acute LEFT basal ganglia and periventricular white matter infarct. FINDINGS: CT HEAD Calvarium and skull base: No  fracture or destructive lesion. Mastoids and middle ears are grossly clear. Paranasal sinuses: Imaged portions are clear. Orbits: Negative. Brain: Developing cytotoxic edema, LEFT basal ganglia and periventricular white matter, corresponding to restricted diffusion on MRI. No hemorrhagic transformation. Advanced atrophy with chronic microvascular ischemic change. Large area of remote infarction RIGHT basal ganglia. Smaller LEFT cerebellar infarct. CTA NECK Aortic arch: Bovine trunk. Imaged portion shows no evidence of aneurysm or dissection. No significant stenosis of the major arch vessel origins. Minor atheromatous change LEFT subclavian origin. Right carotid system: No evidence of dissection, stenosis (50% or greater) or occlusion. Left carotid system: No evidence of dissection, stenosis (50% or greater) or occlusion. Vertebral arteries: LEFT vertebral dominant. No evidence of dissection, stenosis (50% or greater) or occlusion. Nonvascular soft tissues: Spondylosis. No lung apex lesion. Intubated. Orogastric tube. No neck masses. CTA HEAD Anterior circulation: No significant stenosis, proximal occlusion, aneurysm, or vascular malformation. Posterior circulation: No significant stenosis, proximal occlusion, aneurysm, or vascular malformation. Venous sinuses: As permitted by contrast timing, patent. Anatomic variants: None of significance. Delayed phase:   No abnormal intracranial enhancement. IMPRESSION: No extracranial or intracranial stenosis of significance to explain the patient's acute LEFT basal ganglia infarct. Specifically no LEFT MCA dissection or thrombus. Chronic changes as described. Developing cytotoxic edema LEFT basal ganglia and white matter corresponding to restricted diffusion on MR. No hemorrhagic transformation. Electronically Signed   By: Elsie StainJohn T Curnes M.D.   On: 06/05/2015 12:23   Mr Brain Wo Contrast  06/05/2015  CLINICAL DATA:  Unresponsive. History of hypertension, stroke, alcohol  abuse. EXAM: MRI HEAD WITHOUT CONTRAST TECHNIQUE: Multiplanar, multiecho pulse sequences of the brain and surrounding structures were obtained without intravenous contrast. COMPARISON:  CT head June 04, 2015 FINDINGS: 15 x 24 mm area of reduced diffusion LEFT posterior basal ganglia with corresponding low ADC values, mild FLAIR T2 hyperintense  signal. No susceptibility artifact to suggest acute blood products. Old bilateral basal ganglia and thalamus lacunar infarcts, superimposed minimal susceptibility artifact associated with the RIGHT basal ganglia infarcts. Old small LEFT cerebellar infarct. Patchy to confluent supratentorial white matter FLAIR T2 hyperintensities without midline shift, mass effect or masses. Asymmetrically smaller RIGHT cerebral peduncle compatible with wallerian degeneration. No abnormal extra-axial fluid collections. No extra-axial masses though, contrast enhanced sequences would be more sensitive. Normal major intracranial vascular flow voids seen at the skull base. Old LEFT medial orbital blowout fracture. Old LEFT facial fractures. Small suspected LEFT frontal scalp hematoma. No abnormal sellar expansion. No suspicious calvarial bone marrow signal. Craniocervical junction maintained. Visualized paranasal sinuses and mastoid air cells are well-aerated. IMPRESSION: Acute LEFT basal ganglia lacunar infarct. Old bilateral basal ganglia and thalamus lacunar infarcts. Old small LEFT cerebellar infarct. Moderate to severe chronic small vessel ischemic disease. Electronically Signed   By: Awilda Metro M.D.   On: 06/05/2015 02:42   Dg Chest Port 1 View  06/23/2015  CLINICAL DATA:  Respiratory failure EXAM: PORTABLE CHEST 1 VIEW COMPARISON:  06/21/2015 chest radiograph. FINDINGS: Tracheostomy tube tip overlies the tracheal air column at the thoracic inlet. Enteric tube enters the stomach with the tip not seen on this image. Stable cardiomediastinal silhouette with top-normal heart size.  No pneumothorax. Probable trace left pleural effusion. There is severe patchy and linear opacities in the bilateral upper lungs greater than lower lungs, slightly worsened. IMPRESSION: 1. Slight worsening of severe patchy and linear opacities in the upper greater than lower lungs bilaterally. Differential includes pulmonary edema and/or multifocal pneumonia. 2. Probable trace left pleural effusion. Electronically Signed   By: Delbert Phenix M.D.   On: 06/23/2015 16:15   Dg Chest Port 1 View  06/21/2015  CLINICAL DATA:  Acute respiratory failure EXAM: PORTABLE CHEST 1 VIEW COMPARISON:  06/19/2015 FINDINGS: Tracheostomy remains in good position.  Gastric tube in the stomach. Diffuse bilateral airspace disease right greater than left is unchanged. No effusion. Mild atelectasis in the lung bases. IMPRESSION: Bilateral airspace disease unchanged. Support lines remain in satisfactory position. Electronically Signed   By: Marlan Palau M.D.   On: 06/21/2015 07:47   Dg Chest Port 1 View  06/19/2015  CLINICAL DATA:  Acute respiratory failure EXAM: PORTABLE CHEST 1 VIEW COMPARISON:  06/18/2015 FINDINGS: Cardiac shadow is stable. A tracheostomy tube and nasogastric catheter are noted in satisfactory position. Bilateral pulmonary infiltrates are noted relatively similar to that seen on the prior exam with the exception of some improved aeration in the right upper lobe. IMPRESSION: Diffuse bilateral infiltrates. Electronically Signed   By: Alcide Clever M.D.   On: 06/19/2015 08:15   Dg Chest Port 1 View  06/18/2015  CLINICAL DATA:  Patient with respiratory failure. EXAM: PORTABLE CHEST 1 VIEW COMPARISON:  Chest radiograph 06/17/2015. FINDINGS: Tracheostomy tube stable in position. Enteric tube tip and side-port project over the stomach. Grossly unchanged diffuse bilateral airspace pulmonary opacities. No pleural effusion or pneumothorax. Regional skeleton is unremarkable. IMPRESSION: Grossly unchanged diffuse bilateral  airspace opacities. Considerations include multi focal pneumonia, edema or hemorrhage. Electronically Signed   By: Annia Belt M.D.   On: 06/18/2015 07:57   Dg Chest Port 1 View  06/17/2015  CLINICAL DATA:  Respiratory failure EXAM: PORTABLE CHEST 1 VIEW COMPARISON:  06/16/2015 FINDINGS: Tracheostomy and NG tube are unchanged. Bilateral airspace opacities again noted, most confluent in the right upper lobe. Airspace disease slightly worsened since prior study. No effusions or acute bony abnormality.  Heart is normal size. IMPRESSION: Bilateral airspace disease, slightly worsened since prior study. Electronically Signed   By: Charlett Nose M.D.   On: 06/17/2015 10:15   Dg Chest Port 1 View  06/16/2015  CLINICAL DATA:  Shortness of breath EXAM: PORTABLE CHEST 1 VIEW COMPARISON:  June 15, 2015 FINDINGS: An NG tube is been inserted in the interval with the distal tip in the left upper quadrant. The tracheostomy tube is in good position. No pneumothorax. Bilateral diffuse pulmonary opacities are mildly worsened in the interval. The opacity is a little more focal in the right upper lobe on today's study which is a new finding. IMPRESSION: Worsening pulmonary opacities, now more focal in the right upper lobe. Recommend clinical correlation and follow-up to resolution. Electronically Signed   By: Gerome Sam III M.D   On: 06/16/2015 08:17   Dg Chest Port 1 View  06/15/2015  CLINICAL DATA:  Newly placed tracheostomy tube. EXAM: PORTABLE CHEST - 1 VIEW COMPARISON:  One-view chest x-ray from the same day. FINDINGS: A tracheostomy tube is now in place. The endotracheal tube and NG tube have been removed. The heart size is normal. Mild interstitial coarsening a is again noted. Overall aeration is improved. The visualized soft tissues and bony thorax are unremarkable. IMPRESSION: 1. Interval placement of tracheostomy tube without radiographic evidence for complication. 2. Improved aeration bilaterally. 3. Mild  interstitial coarsening remains. Electronically Signed   By: Marin Roberts M.D.   On: 06/15/2015 15:06   Dg Chest Port 1 View  06/15/2015  CLINICAL DATA:  Acute respiratory failure, status epilepticus, alcoholic withdrawal, acute encephalopathy with cerebral infarction. EXAM: PORTABLE CHEST 1 VIEW COMPARISON:  Portable chest x-ray of June 13, 2015 FINDINGS: The lungs are adequately inflated. There are persistent confluent alveolar opacities bilaterally. There is no pneumothorax or pleural effusion. The heart is normal in size. The pulmonary vascularity is not clearly engorged. The endotracheal tube tip lies 4.5 cm above the carina. The esophagogastric tube tip projects below the inferior margin of the image. IMPRESSION: Stable bilateral airspace opacities. No pleural effusion or pneumothorax. The support tubes are in stable position. Electronically Signed   By: David  [place]and M.D.   On: 06/15/2015 08:04   Dg Chest Port 1 View  06/13/2015  CLINICAL DATA:  Acute respiratory failure EXAM: PORTABLE CHEST 1 VIEW COMPARISON:  06/12/2015 FINDINGS: Support devices are stable. Moderate to severe diffuse bilateral airspace disease again noted, slightly worsened in the right upper lobe since prior study. No effusions. Heart is borderline in size. IMPRESSION: Severe bilateral airspace disease, worsening since prior study, particularly in the right upper lobe. Electronically Signed   By: Charlett Nose M.D.   On: 06/13/2015 07:51   Dg Chest Port 1 View  06/12/2015  CLINICAL DATA:  Respiratory failure. EXAM: PORTABLE CHEST 1 VIEW COMPARISON:  06/11/2015. FINDINGS: Endotracheal tube and NG tube in stable position. Heart size stable. Progressive bilateral pulmonary infiltrates. Small left pleural effusion. No pneumothorax. IMPRESSION: 1. Lines and tubes in stable position. 2. Progressive bilateral pulmonary infiltrates and/or edema. Small left pleural effusion. Electronically Signed   By: Maisie Fus  Register   On:  06/12/2015 07:33   Dg Chest Port 1 View  06/11/2015  CLINICAL DATA:  Pulmonary edema . EXAM: PORTABLE CHEST 1 VIEW COMPARISON:  06/10/2015. FINDINGS: Endotracheal tube and NG tube in stable position. Heart size stable. Pulmonary vascularity normal. Interim partial clearing of bilateral pulmonary infiltrates and/or edema. Small left pleural effusion. No pneumothorax. IMPRESSION: 1. Lines and  tubes stable position. 2. Interim partial clearing of bilateral pulmonary infiltrates and/or edema. Small left pleural effusion. Electronically Signed   By: Maisie Fus  Register   On: 06/11/2015 07:35   Dg Chest Port 1 View  06/10/2015  CLINICAL DATA:  Pneumonia, history stroke EXAM: PORTABLE CHEST 1 VIEW COMPARISON:  Portable exam 0532 hours compared to 06/09/2015 FINDINGS: Tip of endotracheal tube projects 3.2 cm above carina. Nasogastric tube extends into stomach. Upper normal heart size. Stable mediastinal contours. Increased BILATERAL lower lobe infiltrates. Upper lungs clear. No gross pleural effusion or pneumothorax. IMPRESSION: Increased BILATERAL lower lobe infiltrates. Electronically Signed   By: Ulyses Southward M.D.   On: 06/10/2015 07:41   Dg Chest Port 1 View  06/09/2015  CLINICAL DATA:  Intubation.  Stroke. EXAM: PORTABLE CHEST 1 VIEW COMPARISON:  June 08, 2015 FINDINGS: The ET and NG tubes are again seen in good position. No pneumothorax. Mild opacity remains in the medial right lung base. No other interval changes or acute abnormalities. IMPRESSION: No interval change. Mild persistent opacity in the medial right lung base. Support apparatus in good position. Electronically Signed   By: Gerome Sam III M.D   On: 06/09/2015 08:19   Dg Chest Port 1 View  06/08/2015  CLINICAL DATA:  Intubated, stroke EXAM: PORTABLE CHEST 1 VIEW COMPARISON:  06/07/2015 FINDINGS: Cardiomediastinal silhouette is unremarkable. Stable central mild vascular congestion and mild perihilar infrahilar interstitial prominence suspicious for  residual mild interstitial edema. Endotracheal and NG tube are unchanged in position. Mild basilar atelectasis. No segmental infiltrate. IMPRESSION: Stable endotracheal and NG tube position. Persistent central mild vascular congestion and residual mild interstitial edema. Mild basilar atelectasis. No segmental infiltrate. Electronically Signed   By: Natasha Mead M.D.   On: 06/08/2015 07:58   Dg Chest Port 1 View  06/07/2015  CLINICAL DATA:  Acute respiratory failure, intubated patient, CVA. EXAM: PORTABLE CHEST 1 VIEW COMPARISON:  Portable chest x-ray of June 06, 2015 FINDINGS: The lungs are less well inflated today. The interstitial markings are more conspicuous. There is some crowding of the pulmonary vascularity. Interval development of bibasilar atelectasis or pneumonia is suspected. There is no significant pleural effusion and no pneumothorax. The cardiac silhouette is normal in size. The central pulmonary vascularity is more engorged today. The endotracheal tube tip lies approximately 35. cm above the carina. The esophagogastric tube tip projects below the inferior margin of the image. IMPRESSION: Interval worsening in the appearance of the pulmonary vascularity and lung bases consistent with mild CHF and bibasilar atelectasis or pneumonia. The support tubes are in reasonable position. Electronically Signed   By: David  [place] M.D.   On: 06/07/2015 07:55   Dg Chest Port 1 View  06/06/2015  CLINICAL DATA:  Check endotracheal tube placement EXAM: PORTABLE CHEST 1 VIEW COMPARISON:  06/05/2015 FINDINGS: Cardiac shadow is stable. An endotracheal tube and nasogastric catheter are again seen and stable in appearance pre the lungs are well aerated bilaterally. Some very patchy left perihilar changes are noted. No bony abnormality is noted. IMPRESSION: Mild patchy left perihilar changes. A portion of this may be projectional in nature given some patient rotation. Electronically Signed   By: Alcide Clever M.D.   On:  06/06/2015 07:48   Dg Chest Port 1 View  06/05/2015  CLINICAL DATA:  Intubation. EXAM: PORTABLE CHEST 1 VIEW COMPARISON:  06/04/2015. FINDINGS: Lines and tubes in stable position. Heart size stable. Low lung volumes with mild bibasilar atelectasis. No pleural effusion or pneumothorax. IMPRESSION: 1. Lines  and tubes stable position. 2. Low lung volumes with mild bibasilar atelectasis Electronically Signed   By: Maisie Fus  Register   On: 06/05/2015 07:47   Dg Chest Port 1 View  06/04/2015  CLINICAL DATA:  Encounter for intubation. EXAM: PORTABLE CHEST 1 VIEW COMPARISON:  06/04/2015 at 1844 hours FINDINGS: Endotracheal tube tip now projects 4 cm above the carina. Nasal/ orogastric tube is well positioned passing below the diaphragm well into the stomach, unchanged. Interstitial prominence noted at the left lung base on the prior exams is not evident currently. Lungs are now clear. No pleural effusion or pneumothorax. IMPRESSION: 1. Endotracheal tube tip well-positioned 4 cm above the carina. 2. Clear lungs. Electronically Signed   By: Amie Portland M.D.   On: 06/04/2015 22:10   Dg Chest Portable 1 View  06/04/2015  CLINICAL DATA:  60 year old male with altered mental status. ET tube placement. EXAM: PORTABLE CHEST 1 VIEW COMPARISON:  None. FINDINGS: An endotracheal tube is noted with tip at the origin of the right mainstem bronchus. Recommend retraction and repositioning of tube by approximately 5-6 cm. An enteric tube is partially visualized coursing to the left upper abdomen. Tip of the enteric tube is beyond the image margin. Single-view of the chest demonstrate mild increased interstitial densities in the left lower lung field. No focal consolidation, pleural effusion, or pneumothorax. The cardiac silhouette is within normal limits. No acute osseous pathology. The degenerative changes of the left shoulder. IMPRESSION: Endotracheal tube in the right mainstem bronchus. Recommend retraction and repositioning by  approximately 5-6 cm. Mild left lung base interstitial markings. These results were called by telephone at the time of interpretation on 06/04/2015 at 6:33 pm to Dr. Clayborne Dana, who verbally acknowledged these results. Electronically Signed   By: Elgie Collard M.D.   On: 06/04/2015 18:34   Dg Chest Port 1v Same Day  06/04/2015  CLINICAL DATA:  Reposition of endotracheal tube.  Follow-up study. EXAM: PORTABLE CHEST 1 VIEW COMPARISON:  06/04/2015 at 1804 hours FINDINGS: Endotracheal tube tip has been retracted. Tip now projects 5.5 cm above the carina. Orogastric tube is stable passing below the diaphragm well into the stomach. Prominent interstitial densities the left lung base similar to the prior exam. No new lung abnormalities. No pleural effusion or pneumothorax. IMPRESSION: 1. Endotracheal tube tip now projects 5.5 cm above the carina. No other change from the prior study. Electronically Signed   By: Amie Portland M.D.   On: 06/04/2015 18:58   Dg Abd Portable 1v  06/15/2015  CLINICAL DATA:  NG placement EXAM: PORTABLE ABDOMEN - 1 VIEW COMPARISON:  None. FINDINGS: Enteric tube terminates in the distal gastric body. Nonobstructive bowel gas pattern. Moderate degenerative changes of the lumbar spine. IMPRESSION: Enteric tube terminates in the distal gastric body. Electronically Signed   By: Charline Bills M.D.   On: 06/15/2015 19:12    Labs:  CBC:  Recent Labs  06/18/15 0603 06/19/15 0304 06/20/15 0430 06/21/15 0540  WBC 11.6* 13.5* 14.4* 12.9*  HGB 8.1* 8.7* 8.6* 8.0*  HCT 25.7* 27.5* 25.4* 25.1*  PLT 574* 604* 585* 513*    COAGS:  Recent Labs  06/04/15 1753 06/14/15 0351 06/19/15 1121  INR 0.97 1.13 1.06  APTT  --  33 36    BMP:  Recent Labs  06/18/15 0603 06/19/15 0304 06/20/15 0430 06/21/15 0540  NA 146* 148* 141 138  K 3.9 4.4 4.5 4.5  CL 105 106 104 105  CO2 31 29 25 24   GLUCOSE 136*  125* 121* 207*  BUN 33* 29* 27* 31*  CALCIUM 8.5* 8.8* 8.5* 8.4*    CREATININE 0.86 0.82 0.75 0.82  GFRNONAA >60 >60 >60 >60  GFRAA >60 >60 >60 >60    LIVER FUNCTION TESTS:  Recent Labs  06/04/15 1753 06/10/15 0246 06/13/15 0300 06/18/15 0603  BILITOT 0.7 0.4 0.4 0.5  AST 27 140* 267* 83*  ALT 22 82* 62 62  ALKPHOS 75 82 58 68  PROT 9.6* 7.0 6.1* 6.0*  ALBUMIN 4.6 2.6* 2.1* 1.8*    TUMOR MARKERS: No results for input(s): AFPTM, CEA, CA199, CHROMGRNA in the last 8760 hours.  Assessment and Plan:  Dysphagia Malnutrition  Will attempt percutaneous gastrostomy tube placement, however given the patient's profound angioedema, this may be difficult.  Risks and Benefits discussed with the patient including, but not limited to the need for a barium enema during the procedure, bleeding, infection, peritonitis, or damage to adjacent structures.  All of the patient's questions were answered, patient is agreeable to proceed. Consent signed and in chart.  Thank you for this interesting consult.  I greatly enjoyed meeting Teruo Stilley and look forward to participating in their care.  A copy of this report was sent to the requesting provider on this date.  Electronically Signed: Gwynneth Macleod PA-C 06/24/2015, 8:26 AM   I spent a total of 20 Minutes in face to face in clinical consultation, greater than 50% of which was counseling/coordinating care for gastrostomy tube.

## 2015-06-25 ENCOUNTER — Inpatient Hospital Stay (HOSPITAL_COMMUNITY): Payer: Medicare Other

## 2015-06-25 DIAGNOSIS — Z930 Tracheostomy status: Secondary | ICD-10-CM

## 2015-06-25 LAB — CBC
HEMATOCRIT: 26.7 % — AB (ref 39.0–52.0)
Hemoglobin: 8.7 g/dL — ABNORMAL LOW (ref 13.0–17.0)
MCH: 29.8 pg (ref 26.0–34.0)
MCHC: 32.6 g/dL (ref 30.0–36.0)
MCV: 91.4 fL (ref 78.0–100.0)
PLATELETS: 618 10*3/uL — AB (ref 150–400)
RBC: 2.92 MIL/uL — ABNORMAL LOW (ref 4.22–5.81)
RDW: 15.2 % (ref 11.5–15.5)
WBC: 15.6 10*3/uL — ABNORMAL HIGH (ref 4.0–10.5)

## 2015-06-25 LAB — PROTIME-INR
INR: 1.06 (ref 0.00–1.49)
Prothrombin Time: 14 seconds (ref 11.6–15.2)

## 2015-06-25 LAB — CULTURE, BLOOD (ROUTINE X 2): Culture: NO GROWTH

## 2015-06-25 LAB — GLUCOSE, CAPILLARY
GLUCOSE-CAPILLARY: 199 mg/dL — AB (ref 65–99)
GLUCOSE-CAPILLARY: 154 mg/dL — AB (ref 65–99)
GLUCOSE-CAPILLARY: 114 mg/dL — AB (ref 65–99)
GLUCOSE-CAPILLARY: 126 mg/dL — AB (ref 65–99)
GLUCOSE-CAPILLARY: 126 mg/dL — AB (ref 65–99)
Glucose-Capillary: 117 mg/dL — ABNORMAL HIGH (ref 65–99)
Glucose-Capillary: 190 mg/dL — ABNORMAL HIGH (ref 65–99)

## 2015-06-25 MED ORDER — HEPARIN SODIUM (PORCINE) 5000 UNIT/ML IJ SOLN
5000.0000 [IU] | Freq: Three times a day (TID) | INTRAMUSCULAR | Status: DC
  Administered 2015-06-25 – 2015-07-12 (×50): 5000 [IU] via SUBCUTANEOUS
  Filled 2015-06-25 (×48): qty 1

## 2015-06-25 MED ORDER — ASPIRIN 81 MG PO CHEW
81.0000 mg | CHEWABLE_TABLET | Freq: Every day | ORAL | Status: DC
  Administered 2015-06-25 – 2015-07-02 (×8): 81 mg via ORAL
  Filled 2015-06-25 (×8): qty 1

## 2015-06-25 NOTE — Progress Notes (Signed)
PULMONARY / CRITICAL CARE MEDICINE   Name: Gregory Parker MRN: [id number] DOB: 1955/04/24    ADMISSION DATE:  06/04/2015 CONSULTATION DATE:  06/04/2015  REFERRING MD:  [facility] at [facility]  CHIEF COMPLAINT:  AMS  SUBJECTIVE:  Tolerating TC. No events.  VITAL SIGNS: BP 172/95 mmHg  Pulse 117  Temp(Src) 99.9 F (37.7 C) (Axillary)  Resp 31  Ht  (1.651 m)  Wt 65.5 kg (144 lb 6.4 oz)  BMI 24.03 kg/m2  SpO2 92%  VENTILATOR SETTINGS: Vent Mode:  [-]  FiO2 (%):  [35 %-40 %] 35 %  INTAKE / OUTPUT: I/O last 3 completed shifts: In: 3870 [NG/GT:3720; IV Piggyback:150] Out: 2875 [Urine:2875]  PHYSICAL EXAMINATION: General: ill-appearing, ventilated Neuro: opens eyes, may have turned to voice/noise, doesn't follow commands or do anything purposeful HEENT: massive tongue swelling looks stable 3/16, trach site clean Cardiac: regular Chest: b/l rhonchi Abd: soft, non tender Ext: 1+ edema Skin: no rashes  BMET  Recent Labs Lab 06/19/15 0304 06/20/15 0430 06/21/15 0540  NA 148* 141 138  K 4.4 4.5 4.5  CL 106 104 105  CO2 BUN 29* 27* 31*  CREATININE 0.82 0.75 0.82  GLUCOSE 125* 121* 207*   Electrolytes  Recent Labs Lab 06/19/15 0304 06/20/15 0430 06/21/15 0540  CALCIUM 8.8* 8.5* 8.4*  MG 2.0  --   --   PHOS 4.0  --   --    CBC  Recent Labs Lab 06/20/15 0430 06/21/15 0540 06/25/15 0456  WBC 14.4* 12.9* 15.6*  HGB 8.6* 8.0* 8.7*  HCT 25.4* 25.1* 26.7*  PLT 585* 513* 618*   Coag's  Recent Labs Lab 06/19/15 1121 06/25/15 0456  APTT 36  --   INR 1.06 1.06   Sepsis Markers  Recent Labs Lab 06/22/15 0545 06/23/15 0404  PROCALCITON 0.28 0.21   ABG  Recent Labs Lab 06/22/15 1202  PHART 7.491*  PCO2ART 35.0  PO2ART 58.0*   Liver Enzymes No results for input(s): AST, ALT, ALKPHOS, BILITOT, ALBUMIN in the last 168 hours. Cardiac Enzymes No results for input(s): TROPONINI, PROBNP in the last 168 hours. Glucose  Recent  Labs Lab 06/24/15 1603 06/24/15 2001 06/25/15 0034 06/25/15 0403 06/25/15 0744 06/25/15 1217  GLUCAP 121* 136* 190* 199* 154* 117*   Imaging No results found.   CULTURES: BCx2 2/27 , 2/28 >ng 3/2 resp >>> strep PNA (sens) 3/8 bld >> negative 3/8 resp >> nml flora\ 3/15 resp >> normal flora 3/15 blood >> 1 of 2 GPC >>  3/15 urine >> negative  LINES/TUBES: ETT 2/27>>> 3/10 Trach 3/10 >>  SIGNIFICANT EVENTS: 2/27 AMS, ? Seizure, intubated in [facility] ED, tx to [facility] ICU 3/05 cardene gtt 3/05 dantrolene stopped 3/10 change to pressure control  STUDIES:  CT head 2/27 >  No acute intracranial abnormality. Remote bilateral basal ganglia lacunar infarcts. Apparent soft tissue fullness in the nasopharynx could relate to retained secretions, especially given absence of mastoid effusions.  MRI brain 3/3>>>acute left basal ganglia lacunar EEG 3/6 nml  DISCUSSION: 60 yo male presented to [facility] with altered mental status.  He has hx of ETOH and CVA, and reported to smoke crack prior to admission.  Concern was he had alcohol withdrawal seizure.  Intubated for airway protection. Now w trach   I reviewed CXR myself trach in good position.  ASSESSMENT / PLAN:  NEUROLOGIC A:   Acute encephalopathy. 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 P:   Monitor mental status Will TEMPORARILY  stop ASA 3/16 given a possible contributor to angioedema, will restart ASA at 81 mg. Thiamine, folic acid completed Placed fentanyl patch 3/16, fentanyl gtt off  PULMONARY A: Acute respiratory failure in setting of seizures an HCAP. Failure to wean s/p trach, beginning some ATC on 3/13 Apparent angioedema, tongue enlargement P:   ATC as tolerated, F/u CXR intermittently Cause tongue enlargement unclear, will d/c potential offending meds as able > ASA on 3/16. ? Chlorhexadine.  Solumedrol and pepcid given, completed  CARDIOVASCULAR A:  HTN. P:  Continue cardizem per tube ASA 81 mg  per tube started 3/20, if swelling worsens then will stop and depend on another anti-plat agent.  RENAL A:  Hypernatremia. Rhabdomyolysis on admission >> resolved. P:   BMET in AM. Replace electrolytes as indicated. Continue free water as ordered  GASTROINTESTINAL A:   Protein calorie malnutrition. P:   Continue tube feeds No PEG today due to fever and elevated WBC. Protonix for SUP  HEMATOLOGIC A:   Anemia of chronic disease and critical illness. Leukocytosis  P:  F/u CBC SQ heparin  INFECTIOUS A:   CAP with pneumococcus in sputum. GPC on blood cx 1 of 2, ? contaminant P:   LP 3/1>>>negative Vancomycin 3/1>>>3/2 Rocephin 3/1>>>3/2>>> 3/12 Amp 3/1>>>3/2 Acyclovir 3/1>>>3/2  Completed course ceftriaxone 3/12 for pneumococcus  Repeated cx's 3/15, note 1 of 2 GPC blood cx. With dropping Pct will defer abx, follow   ENDOCRINE A:   No acute issues.   P:   Monitor blood sugar, has not required coverage  Disposition >> case manager assessing for trach SNF when PEG is in place.  Discussed with case management.  Alyson ReedyWesam G. Yacoub, M.D. [facility] Pulmonary/Critical Care Medicine. Pager: [phone]. After hours pager: [phone].

## 2015-06-25 NOTE — Progress Notes (Signed)
eLink Physician-Brief Progress Note Patient Name: Gregory CaterClay Parker DOB: [birth year] MRN: [id number]   Date of Service  06/25/2015  HPI/Events of Note  Patient pulled NGT out several inches. NGT repositioned.   eICU Interventions  Will order: 1. Portable Abdominal film for NGT position now.      Intervention Category Minor Interventions: Clinical assessment - ordering diagnostic tests  Lenell AntuSommer,Necia Kamm Eugene 06/25/2015, 5:45 PM

## 2015-06-25 NOTE — Progress Notes (Signed)
PCCM MD consulted. Patient profusely diaphoretic throughout the day. Patient NG Tube dislodged approximately 5.5cm. Tube feeding stopped. NG Tube repositioned and re-secured. New order received for xray to confirm placement of tube in order to resume feeds. Will continue to monitor patient closely.

## 2015-06-25 NOTE — Progress Notes (Signed)
Patient ID: [name], male   DOB: 10-Dec-1955, 60 y.o.   MRN: [id number] Pt has elevated WBC that is up from most recent numbers.  It is 15.6 today.  He had a low grade fever overnight of 100.2  He has new cx from his tracheal aspirate that is growing gram - rods and gram+ cocci.  We will hold off on g-tube today.  Repeat CBC in am and follow fever curve.  Danel Studzinski E 9:16 AM 06/25/2015

## 2015-06-26 ENCOUNTER — Inpatient Hospital Stay (HOSPITAL_COMMUNITY): Payer: Medicare Other

## 2015-06-26 LAB — BASIC METABOLIC PANEL
ANION GAP: 13 (ref 5–15)
BUN: 27 mg/dL — ABNORMAL HIGH (ref 6–20)
CALCIUM: 9.3 mg/dL (ref 8.9–10.3)
CO2: 23 mmol/L (ref 22–32)
Chloride: 103 mmol/L (ref 101–111)
Creatinine, Ser: 0.75 mg/dL (ref 0.61–1.24)
GFR calc Af Amer: >60 mL/min (ref >60)
GFR calc non Af Amer: >60 mL/min (ref >60)
GLUCOSE: 147 mg/dL — AB (ref 65–99)
Potassium: 5.5 mmol/L — ABNORMAL HIGH (ref 3.5–5.1)
Sodium: 139 mmol/L (ref 135–145)

## 2015-06-26 LAB — CBC
HCT: 28.5 % — ABNORMAL LOW (ref 39.0–52.0)
HEMOGLOBIN: 8.9 g/dL — AB (ref 13.0–17.0)
MCH: 28.4 pg (ref 26.0–34.0)
MCHC: 31.2 g/dL (ref 30.0–36.0)
MCV: 91.1 fL (ref 78.0–100.0)
Platelets: 662 10*3/uL — ABNORMAL HIGH (ref 150–400)
RBC: 3.13 MIL/uL — ABNORMAL LOW (ref 4.22–5.81)
RDW: 15.7 % — ABNORMAL HIGH (ref 11.5–15.5)
WBC: 17.6 10*3/uL — ABNORMAL HIGH (ref 4.0–10.5)

## 2015-06-26 LAB — CULTURE, RESPIRATORY: SPECIAL REQUESTS: NORMAL

## 2015-06-26 LAB — GLUCOSE, CAPILLARY
GLUCOSE-CAPILLARY: 102 mg/dL — AB (ref 65–99)
GLUCOSE-CAPILLARY: 93 mg/dL (ref 65–99)
Glucose-Capillary: 132 mg/dL — ABNORMAL HIGH (ref 65–99)
Glucose-Capillary: 151 mg/dL — ABNORMAL HIGH (ref 65–99)
Glucose-Capillary: 83 mg/dL (ref 65–99)
Glucose-Capillary: 85 mg/dL (ref 65–99)

## 2015-06-26 LAB — CULTURE, RESPIRATORY W GRAM STAIN: Culture: NORMAL

## 2015-06-26 LAB — PHOSPHORUS: Phosphorus: 5 mg/dL — ABNORMAL HIGH (ref 2.5–4.6)

## 2015-06-26 LAB — MAGNESIUM: MAGNESIUM: 2 mg/dL (ref 1.7–2.4)

## 2015-06-26 MED ORDER — SODIUM CHLORIDE 0.9 % IV SOLN
INTRAVENOUS | Status: AC | PRN
  Administered 2015-06-26: 10 mL/h via INTRAVENOUS

## 2015-06-26 MED ORDER — GLUCAGON HCL RDNA (DIAGNOSTIC) 1 MG IJ SOLR
INTRAMUSCULAR | Status: AC | PRN
  Administered 2015-06-26: 1 mg via INTRAVENOUS

## 2015-06-26 MED ORDER — IOHEXOL 300 MG/ML  SOLN
50.0000 mL | Freq: Once | INTRAMUSCULAR | Status: AC | PRN
  Administered 2015-06-26: 20 mL

## 2015-06-26 MED ORDER — FENTANYL CITRATE (PF) 100 MCG/2ML IJ SOLN
INTRAMUSCULAR | Status: AC
  Filled 2015-06-26: qty 4

## 2015-06-26 MED ORDER — FENTANYL CITRATE (PF) 100 MCG/2ML IJ SOLN
INTRAMUSCULAR | Status: AC | PRN
  Administered 2015-06-26: 25 ug via INTRAVENOUS
  Administered 2015-06-26: 50 ug via INTRAVENOUS
  Administered 2015-06-26: 25 ug via INTRAVENOUS

## 2015-06-26 MED ORDER — GLUCAGON HCL RDNA (DIAGNOSTIC) 1 MG IJ SOLR
INTRAMUSCULAR | Status: AC
Start: 2015-06-26 — End: 2015-06-26
  Filled 2015-06-26: qty 1

## 2015-06-26 MED ORDER — LIDOCAINE-EPINEPHRINE (PF) 1 %-1:200000 IJ SOLN
INTRAMUSCULAR | Status: AC
  Filled 2015-06-26: qty 30

## 2015-06-26 MED ORDER — SCOPOLAMINE 1 MG/3DAYS TD PT72
1.0000 | MEDICATED_PATCH | TRANSDERMAL | Status: DC
  Administered 2015-06-26 – 2015-07-11 (×6): 1.5 mg via TRANSDERMAL
  Filled 2015-06-26 (×6): qty 1

## 2015-06-26 MED ORDER — CEFAZOLIN SODIUM-DEXTROSE 2-3 GM-% IV SOLR
INTRAVENOUS | Status: AC
Start: 2015-06-26 — End: 2015-06-26
  Filled 2015-06-26: qty 50

## 2015-06-26 MED ORDER — MIDAZOLAM HCL 2 MG/2ML IJ SOLN
INTRAMUSCULAR | Status: AC | PRN
  Administered 2015-06-26 (×2): 0.5 mg via INTRAVENOUS
  Administered 2015-06-26: 1 mg via INTRAVENOUS

## 2015-06-26 MED ORDER — CEFAZOLIN SODIUM-DEXTROSE 2-3 GM-% IV SOLR
2.0000 g | INTRAVENOUS | Status: AC
  Administered 2015-06-26: 2 g via INTRAVENOUS

## 2015-06-26 MED ORDER — MIDAZOLAM HCL 2 MG/2ML IJ SOLN
INTRAMUSCULAR | Status: AC
  Filled 2015-06-26: qty 4

## 2015-06-26 NOTE — Clinical Social Work Note (Signed)
Clinical Social Work Assessment  Patient Details  Name: Gregory Parker MRN: [id number] Date of Birth: 10/11/1955  Date of referral:  06/26/15               Reason for consult:  Facility Placement                Permission sought to share information with:  Family Supports, Magazine features editoracility Contact Representative Permission granted to share information::  Yes, Verbal Permission Granted  Name::        Agency::     Relationship::     Contact Information:     Housing/Transportation Living arrangements for the past 2 months:  Single Family Home Source of Information:  Other (Comment Required) (sister caregiver Joylene IgoJean Subia [phone])) Patient Interpreter Needed:  None Criminal Activity/Legal Involvement Pertinent to Current Situation/Hospitalization:  No - Comment as needed Significant Relationships:  Siblings (brothers Dorinda HillDonald [phone]) and Link Snufferddie [phone])) Lives with:  Siblings Do you feel safe going back to the place where you live?  Yes Need for family participation in patient care:  Yes (Comment)  Care giving concerns:  Patient is on a trach. PT recommend SNF.   Social Worker assessment / plan:  BSW intern had to complete an assessment via telephone with patient sister Joylene IgoJean Burrill who can contacted at [phone]). BSW intern explained PT recommendation and referral process. Patient sister and brothers are agreeable with patient going to SNF because none of them can provide that 24 hour assistance to the patient while at home. Patient sister said to contact her regarding anything that concerns her brother or any direct questions we have.    Employment status:  Disabled (Comment on whether or not currently receiving Disability) Insurance information:  [place] PT Recommendations:  Skilled Nursing Facility Information / Referral to community resources:  Skilled Nursing Facility  Patient/Family's Response to care:  Patients family has a good response to patient care  and are appreciative of BSW intern help.  Patient/Family's Understanding of and Emotional Response to Diagnosis, Current Treatment, and Prognosis:  Patients family has good insight on patient current condition as well as treatment and planning.   Emotional Assessment Appearance:    Attitude/Demeanor/Rapport:  Unable to Assess Affect (typically observed):  Unable to Assess Orientation:    Alcohol / Substance use:  Never Used Psych involvement (Current and /or in the community):  No (Comment)  Discharge Needs  Concerns to be addressed:  No discharge needs identified Readmission within the last 30 days:  No Current discharge risk:  None Barriers to Discharge:  No Barriers Identified   Catheryn BaconCharlean McNeil  BSW intern  [phone]   CSW reviewed above assessment and agrees with its findings  Merlyn LotJenna Holoman, [facility] VinitaCSWA Clinical Social Worker [phone]

## 2015-06-26 NOTE — Progress Notes (Signed)
eLink Physician-Brief Progress Note Patient Name: Carlisle CaterClay Marasco DOB: 1955-12-04 MRN: [id number]   Date of Service  06/26/2015  HPI/Events of Note  Notified by bedside nurse of excessive oral and tracheal secretions. Status post tracheostomy.  eICU Interventions  Scopolamine patch ordered every 72 hour     Intervention Category Intermediate Interventions: Other:  Lawanda CousinsJennings Antonin Meininger 06/26/2015, 12:45 AM

## 2015-06-26 NOTE — Procedures (Signed)
Successful fluoroscopic guided insertion of gastrostomy tube.   The gastrostomy tube may be used immediately for medications.   Tube feeds may be initiated in 24 hours as per the primary team.   EBL: Minimal No immediate post procedural complications.   Jay Kaydence Baba, MD Pager #: [phone]    

## 2015-06-26 NOTE — Sedation Documentation (Signed)
Suctioned trach of moderate amount of thick, pink colored secretions

## 2015-06-26 NOTE — Progress Notes (Signed)
PULMONARY / CRITICAL CARE MEDICINE   Name: Carlisle CaterClay Wisnewski MRN: [id number] DOB: [birth year]    ADMISSION DATE:  06/04/2015 CONSULTATION DATE:  06/04/2015  [facility]  CHIEF COMPLAINT:  AMS  SUBJECTIVE:  Tolerating TC but increased to 35%, anticipate for the procedure only.  VITAL SIGNS: BP 168/128 mmHg  Pulse 101  Temp(Src) 99.8 F (37.7 C) (Axillary)  Resp 20  Ht 5\' 5"  (1.651 m)  Wt 65.9 kg (145 lb 4.5 oz)  BMI 24.18 kg/m2  SpO2 100%  VENTILATOR SETTINGS: Vent Mode:  [-]  FiO2 (%):  [28 %-35 %] 28 %  INTAKE / OUTPUT: I/O last 3 completed shifts: In: 2293.7 [NG/GT:2193.7; IV Piggyback:100] Out: 3225 [Urine:3225]  PHYSICAL EXAMINATION: General: ill-appearing, ventilated Neuro: opens eyes, may have turned to voice/noise, doesn't follow commands or do anything purposeful HEENT: massive tongue swelling looks stable 3/16, trach site clean Cardiac: regular Chest: b/l rhonchi Abd: soft, non tender Ext: 1+ edema Skin: no rashes  BMET  Recent Labs Lab 06/20/15 0430 06/21/15 0540 06/26/15 0204  NA 141 138 139  K 4.5 4.5 5.5*  CL 104 105 103  CO2 25 24 23   BUN 27* 31* 27*  CREATININE 0.75 0.82 0.75  GLUCOSE 121* 207* 147*   Electrolytes  Recent Labs Lab 06/20/15 0430 06/21/15 0540 06/26/15 0204  CALCIUM 8.5* 8.4* 9.3  MG  --   --  2.0  PHOS  --   --  5.0*   CBC  Recent Labs Lab 06/21/15 0540 06/25/15 0456 06/26/15 0204  WBC 12.9* 15.6* 17.6*  HGB 8.0* 8.7* 8.9*  HCT 25.1* 26.7* 28.5*  PLT 513* 618* 662*   Coag's  Recent Labs Lab 06/25/15 0456  INR 1.06   Sepsis Markers  Recent Labs Lab 06/22/15 0545 06/23/15 0404  PROCALCITON 0.28 0.21   ABG  Recent Labs Lab 06/22/15 1202  PHART 7.491*  PCO2ART 35.0  PO2ART 58.0*   Liver Enzymes No results for input(s): AST, ALT, ALKPHOS, BILITOT, ALBUMIN in the last 168 hours. Cardiac Enzymes No results for input(s): TROPONINI, PROBNP in the last 168  hours. Glucose  Recent Labs Lab 06/25/15 1947 06/25/15 2011 06/26/15 0013 06/26/15 0452 06/26/15 0807 06/26/15 1143  GLUCAP 126* 126* 151* 132* 102* 85   Imaging Ir Gastrostomy Tube Mod Sed  06/26/2015  INDICATION: History of stroke, now with dysphagia. Please perform percutaneous gastrostomy tube placement for enteric nutrition supplementation EXAM: PUSH GASTROSTOMY TUBE PLACEMENT COMPARISON:  CT abdomen pelvis - 06/19/2025 MEDICATIONS: Ancef 2 gm IV; Antibiotics were administered within 1 hour of the procedure. CONTRAST:  50 mL of Isovue 300 administered into the gastric lumen. ANESTHESIA/SEDATION: Moderate (conscious) sedation was employed during this procedure. A total of Versed 2 mg and Fentanyl 100 mcg was administered intravenously. Moderate Sedation Time: 25 minutes. The patient's level of consciousness and vital signs were monitored continuously by radiology nursing throughout the procedure under my direct supervision. FLUOROSCOPY TIME:  10 minutes 42 seconds (140 mGy) COMPLICATIONS: None immediate. PROCEDURE: Informed written consent was obtained from the patient's family following explanation of the procedure, risks, benefits and alternatives. A time out was performed prior to the initiation of the procedure. Ultrasound scanning was performed to demarcate the edge of the left lobe of the liver. Maximal barrier sterile technique utilized including caps, mask, sterile gowns, sterile gloves, large sterile drape, hand hygiene and Betadine prep. The left upper quadrant was sterilely prepped and draped. A oral gastric catheter was inserted into the stomach  under fluoroscopy. The existing nasogastric feeding tube was removed. The left costal margin and air opacified transverse colon were identified and avoided. Air was injected into the stomach for insufflation and visualization under fluoroscopy. Under sterile conditions and local anesthesia, 3 T tacks were utilized to pexy the anterior aspect of  the stomach against the ventral abdominal wall. Contrast injection confirmed appropriate positioning of each of the T tacks. An incision was made between the T tacks and a 17 gauge trocar needle was utilized to access the stomach. Needle position was confirmed within the stomach with aspiration of air and injection of a small amount of contrast. A stiff Glidewire was advanced into the gastric lumen and under intermittent fluoroscopic guidance, the access needle was exchanged for a Kumpe catheter. With the use of the Kumpe catheter, attempts were made to advance a stiff Glidewire into duodenum, however this ultimately proved unsuccessful. Under intermittent fluoroscopic guidance, the Kumpe catheter was exchanged for serial dilators, ultimately allowing placement of a 20 French peel-away sheath. Under intermittent fluoroscopic guidance, a 18-French balloon retention gastrostomy tube was inserted through the peel-away sheath. The retention balloon was insufflated with a mixture of dilute saline and contrast and pulled taut against the anterior wall of the stomach. The external disc was cinched. Contrast injection confirms positioning within the stomach. Several spot radiographic images were obtained in various obliquities for documentation. T he patient tolerated procedure well without immediate post procedural complication. FINDINGS: After successful fluoroscopic guided placement, the gastrostomy tube is appropriately positioned with internal retention balloon against the ventral aspect of the gastric lumen. IMPRESSION: Successful fluoroscopic insertion of an 53 French balloon retention gastrostomy tube. The gastrostomy may be used immediately for medication administration and in 24 hrs for the initiation of feeds. Electronically Signed   By: Simonne Come M.D.   On: 06/26/2015 11:03   Dg Abd Portable 1v  06/25/2015  CLINICAL DATA:  Check gastric catheter placement EXAM: PORTABLE ABDOMEN - 1 VIEW COMPARISON:  None.  FINDINGS: Nasogastric catheter is noted within the stomach. Scattered infiltrative changes are noted in the bases bilaterally. Scattered large and small bowel gas is noted. IMPRESSION: Nasogastric catheter within the stomach. Electronically Signed   By: Alcide Clever M.D.   On: 06/25/2015 18:19     CULTURES: BCx2 2/27 , 2/28 >ng 3/2 resp >>> strep PNA (sens) 3/8 bld >> negative 3/8 resp >> nml flora\ 3/15 resp >> normal flora 3/15 blood >> 1 of 2 GPC >>  3/15 urine >> negative  LINES/TUBES: ETT 2/27>>> 3/10 Trach 3/10 >>  SIGNIFICANT EVENTS: 2/27 AMS, ? Seizure, intubated in [facility] ED, tx to [facility] ICU 3/05 cardene gtt 3/05 dantrolene stopped 3/10 change to pressure control  STUDIES:  CT head 2/27 >  No acute intracranial abnormality. Remote bilateral basal ganglia lacunar infarcts. Apparent soft tissue fullness in the nasopharynx could relate to retained secretions, especially given absence of mastoid effusions.  MRI brain 3/3>>>acute left basal ganglia lacunar EEG 3/6 nml  DISCUSSION: 60 yo male presented to [facility] with altered mental status.  He has hx of ETOH and CVA, and reported to smoke crack prior to admission.  Concern was he had alcohol withdrawal seizure.  Intubated for airway protection. Now w trach   I reviewed CXR myself trach in good position.  ASSESSMENT / PLAN:  NEUROLOGIC A:   Acute encephalopathy. 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 P:   Monitor mental status TEMPORARILY stopped ASA 3/16 given a possible contributor to angioedema, will  restart ASA at 81 mg. Thiamine, folic acid completed Placed fentanyl patch 3/16, fentanyl gtt off  PULMONARY A: Acute respiratory failure in setting of seizures an HCAP. Failure to wean s/p trach, beginning some ATC on 3/13 Apparent angioedema, tongue enlargement P:   ATC as tolerated, F/u CXR intermittently Cause tongue enlargement unclear, will d/c potential offending meds as able > ASA on 3/16. ?  Chlorhexadine.  Solumedrol and pepcid given, completed  CARDIOVASCULAR A:  HTN. P:  Continue cardizem per tube ASA 81 mg per tube started 3/20, if swelling worsens then will stop and depend on another anti-plat agent.  RENAL A:  Hypernatremia. Rhabdomyolysis on admission >> resolved. P:   BMET in AM. Replace electrolytes as indicated. Continue free water as ordered  GASTROINTESTINAL A:   Protein calorie malnutrition. P:   Continue tube feeds G-tube placed by IR 3/21. Protonix for SUP  HEMATOLOGIC A:   Anemia of chronic disease and critical illness. Leukocytosis  P:  F/u CBC SQ heparin  INFECTIOUS A:   CAP with pneumococcus in sputum. GPC on blood cx 1 of 2, ? contaminant P:   LP 3/1>>>negative Vancomycin 3/1>>>3/2 Rocephin 3/1>>>3/2>>> 3/12 Amp 3/1>>>3/2 Acyclovir 3/1>>>3/2  Completed course ceftriaxone 3/12 for pneumococcus  Repeated cx's 3/15, note 1 of 2 GPC blood cx. With dropping Pct will defer abx, follow   ENDOCRINE A:   No acute issues.   P:   Monitor blood sugar, has not required coverage  Disposition >> case manager assessing for trach SNF when PEG is in place.  Discussed with case management.  Alyson Reedy, M.D. [facility] Pulmonary/Critical Care Medicine. Pager: [phone]. After hours pager: [phone].

## 2015-06-26 NOTE — Progress Notes (Signed)
Nutrition Follow-up  DOCUMENTATION CODES:   Severe malnutrition in context of chronic illness  INTERVENTION:    As medically appropriate, resume Vital AF 1.2 formula at 20 ml/hr and increase by 10 ml every 4 hours to goal rate of 70 ml/hr to provide 2016 kcals, 126 gm protein, 1362 ml of free water  NUTRITION DIAGNOSIS:   Malnutrition related to chronic illness as evidenced by severe depletion of body fat, severe depletion of muscle mass. Ongoing.   GOAL:   Patient will meet greater than or equal to 90% of their needs Met.   MONITOR:   TF tolerance, I & O's, Weight trends  ASSESSMENT:   Pt with etoh and drug abuse history presenting with seizures and acute CVA.   Patient s/p procedure 3/21: IR GASTROSTOMY TUBE MOD SED   Patient currently on trach collar.  Previous TF regimen: Vital AF 1.2 formula at 70 ml/hr providing 2016 kcals, 126 gm protein, 1362 ml of free water  Disposition: trach SNF placement.  Diet Order:  Diet NPO time specified Except for: Sips with Meds  Skin:  Reviewed, no issues  Last BM:  3/16  Height:   Ht Readings from Last 1 Encounters:  06/23/15 '5\' 5"'$  (1.651 m)    Weight:   Wt Readings from Last 1 Encounters:  06/26/15 145 lb 4.5 oz (65.9 kg)    Ideal Body Weight:  59 kg  BMI:  Body mass index is 24.18 kg/(m^2).  Estimated Nutritional Needs:   Kcal:  1900-2100  Protein:  95-120 grams  Fluid:  > 1.6 L/day  EDUCATION NEEDS:   No education needs identified at this time  Arthur Holms, RD, LDN Pager #: [phone] After-Hours Pager #: [phone]

## 2015-06-26 NOTE — NC FL2 (Signed)
[place] MEDICAID FL2 LEVEL OF CARE SCREENING TOOL     IDENTIFICATION  Patient Name: Gregory Parker Birthdate: 05-May-1955 Sex: male Admission Date (Current Location): 06/04/2015  [facility] and [place] Number:  [employer] and Address:  [facility] [place], [address] [place], [place] [address]      Provider Number:    Attending Physician Name and Address:  Chilton Greathouse, MD  Relative Name and Phone Number:   Quaron Delacruz [phone]))    Current Level of Care: Hospital Recommended Level of Care: Skilled Nursing Facility Prior Approval Number:    Date Approved/Denied:   PASRR Number:    Discharge Plan: SNF    Current Diagnoses: Patient Active Problem List   Diagnosis Date Noted  . Acute respiratory failure with hypoxemia (HCC)   . Stroke (HCC)   . Pneumonia   . Acute encephalopathy   . Cerebral thrombosis with cerebral infarction 06/05/2015  . Coma (HCC)   . Alcohol withdrawal (HCC)   . Acute respiratory failure (HCC) 06/04/2015  . Seizures (HCC) 06/04/2015  . Encounter for intubation   . Status epilepticus (HCC)   . Alcohol abuse   . Malignant hypertension     Orientation RESPIRATION BLADDER Height & Weight        Tracheostomy (Shiley 6mm cuffed, FiO2 28%) Continent Weight: 145 lb 4.5 oz (65.9 kg) Height:   (165.1 cm)  BEHAVIORAL SYMPTOMS/MOOD NEUROLOGICAL BOWEL NUTRITION STATUS     (Sedated, non-responsive. ) Continent Feeding tube  AMBULATORY STATUS COMMUNICATION OF NEEDS Skin   Total Care Non-Verbally Normal (Intact, warm, no rashes.)                       Personal Care Assistance Level of Assistance  Bathing, Dressing, Feeding Bathing Assistance: Maximum assistance Feeding assistance: Maximum assistance Dressing Assistance: Maximum assistance     Functional Limitations Info  Sight, Hearing, Speech Sight Info: Adequate Hearing Info: Adequate Speech Info: Impaired    SPECIAL CARE FACTORS  FREQUENCY                       Contractures      Additional Factors Info  Code Status, Allergies, Suctioning Needs Code Status Info: full Allergies Info: NKA           Current Medications (06/26/2015):  This is the current hospital active medication list Current Facility-Administered Medications  Medication Dose Route Frequency Provider Last Rate Last Dose  . acetaminophen (TYLENOL) solution 650 mg  650 mg Per Tube Q6H PRN Marvel Plan, MD   650 mg at 06/22/15 2136  . amLODipine (NORVASC) tablet 5 mg  5 mg Per Tube Daily Leslye Peer, MD   5 mg at 06/26/15 1319  . antiseptic oral rinse solution (CORINZ)  7 mL Mouth Rinse QID Praveen Mannam, MD   7 mL at 06/24/15 1512  . aspirin chewable tablet 81 mg  81 mg Oral Daily Alyson Reedy, MD   81 mg at 06/26/15 1319  . ceFAZolin (ANCEF) 2-3 GM-% IVPB SOLR           . diltiazem (CARDIZEM) 10 mg/ml oral suspension 60 mg  60 mg Per Tube 4 times per day Oretha Milch, MD   60 mg at 06/26/15 1320  . famotidine (PEPCID) IVPB 20 mg premix  20 mg Intravenous Q24H Roslynn Amble, MD   20 mg at 06/25/15 1607  . feeding supplement (VITAL AF 1.2  CAL) liquid 1,000 mL  1,000 mL Per Tube Continuous Oretha Milchakesh Alva V, MD   Stopped at 06/25/15 1746  . fentaNYL (DURAGESIC - dosed mcg/hr) patch 75 mcg  75 mcg Transdermal Q72H Leslye Peerobert S Byrum, MD   75 mcg at 06/24/15 [phone]  . fentaNYL (SUBLIMAZE) 100 MCG/2ML injection           . fentaNYL (SUBLIMAZE) injection 50 mcg  50 mcg Intravenous Q2H PRN Storm FriskPatrick E Wright, MD   50 mcg at 06/26/15 0544  . glucagon (human recombinant) (GLUCAGEN) 1 MG injection           . heparin injection 5,000 Units  5,000 Units Subcutaneous 3 times per day Barnetta ChapelKelly Osborne, PA-C   5,000 Units at 06/26/15 1324  . hydrALAZINE (APRESOLINE) injection 10-40 mg  10-40 mg Intravenous Q4H PRN Oretha Milchakesh Alva V, MD   20 mg at 06/25/15 1309  . labetalol (NORMODYNE,TRANDATE) injection 10-20 mg  10-20 mg Intravenous Q2H PRN Oretha Milchakesh Alva V, MD   20 mg  at 06/25/15 1504  . levalbuterol (XOPENEX) nebulizer solution 0.63 mg  0.63 mg Nebulization Q6H Roslynn AmbleJennings E Nestor, MD   0.63 mg at 06/26/15 1348  . lidocaine-EPINEPHrine (XYLOCAINE-EPINEPHrine) 1 %-1:200000 (PF) injection           . methylPREDNISolone sodium succinate (SOLU-MEDROL) 125 mg/2 mL injection 60 mg  60 mg Intravenous Q24H Roslynn AmbleJennings E Nestor, MD   60 mg at 06/25/15 1606  . midazolam (VERSED) 2 MG/2ML injection           . multivitamin with minerals tablet 1 tablet  1 tablet Per Tube Daily Praveen Mannam, MD   1 tablet at 06/26/15 1319  . scopolamine (TRANSDERM-SCOP) 1 MG/3DAYS 1.5 mg  1 patch Transdermal Q72H Roslynn AmbleJennings E Nestor, MD   1.5 mg at 06/26/15 0146     Discharge Medications: Please see discharge summary for a list of discharge medications.  Relevant Imaging Results:  Relevant Lab Results:   Additional Information SS#  [id number]  Catheryn BaconCharlean Taz Vanness  BSW intern  [phone]

## 2015-06-26 NOTE — Sedation Documentation (Signed)
Suctioned of moderate amount thick, pink colored secretions.

## 2015-06-26 NOTE — Sedation Documentation (Signed)
Trach suctioned of moderate amount thick, pink secretions.

## 2015-06-27 LAB — GLUCOSE, CAPILLARY
GLUCOSE-CAPILLARY: 109 mg/dL — AB (ref 65–99)
GLUCOSE-CAPILLARY: 119 mg/dL — AB (ref 65–99)
GLUCOSE-CAPILLARY: 132 mg/dL — AB (ref 65–99)
GLUCOSE-CAPILLARY: 92 mg/dL (ref 65–99)
GLUCOSE-CAPILLARY: 98 mg/dL (ref 65–99)
Glucose-Capillary: 118 mg/dL — ABNORMAL HIGH (ref 65–99)
Glucose-Capillary: 175 mg/dL — ABNORMAL HIGH (ref 65–99)

## 2015-06-27 NOTE — Progress Notes (Signed)
PULMONARY / CRITICAL CARE MEDICINE   Name: Gregory Parker MRN: [id number] DOB: [birth year]    ADMISSION DATE:  06/04/2015 CONSULTATION DATE:  06/04/2015  [facility]  CHIEF COMPLAINT:  AMS  SUBJECTIVE:  Tolerating TC but increased to 35%, anticipate for the procedure only.  VITAL SIGNS: BP 138/101 mmHg  Pulse 75  Temp(Src) 99.6 F (37.6 C) (Axillary)  Resp 21  Ht 5\' 5"  (1.651 m)  Wt 60.4 kg (133 lb 2.5 oz)  BMI 22.16 kg/m2  SpO2 92%  VENTILATOR SETTINGS: Vent Mode:  [-]  FiO2 (%):  [28 %] 28 %  INTAKE / OUTPUT: I/O last 3 completed shifts: In: 230 [Other:230] Out: 3425 [Urine:3425]  PHYSICAL EXAMINATION: General: ill-appearing, on TC Neuro: opens eyes, doesn't follow commands or do anything purposeful HEENT: massive tongue swelling looks stable 3/16, improving, trach site clean Cardiac: regular Chest: b/l rhonchi Abd: soft, non tender Ext: 1+ edema Skin: no rashes  BMET  Recent Labs Lab 06/21/15 0540 06/26/15 0204  NA 138 139  K 4.5 5.5*  CL 105 103  CO2 24 23  BUN 31* 27*  CREATININE 0.82 0.75  GLUCOSE 207* 147*   Electrolytes  Recent Labs Lab 06/21/15 0540 06/26/15 0204  CALCIUM 8.4* 9.3  MG  --  2.0  PHOS  --  5.0*   CBC  Recent Labs Lab 06/21/15 0540 06/25/15 0456 06/26/15 0204  WBC 12.9* 15.6* 17.6*  HGB 8.0* 8.7* 8.9*  HCT 25.1* 26.7* 28.5*  PLT 513* 618* 662*   Coag's  Recent Labs Lab 06/25/15 0456  INR 1.06   Sepsis Markers  Recent Labs Lab 06/22/15 0545 06/23/15 0404  PROCALCITON 0.28 0.21   ABG  Recent Labs Lab 06/22/15 1202  PHART 7.491*  PCO2ART 35.0  PO2ART 58.0*   Liver Enzymes No results for input(s): AST, ALT, ALKPHOS, BILITOT, ALBUMIN in the last 168 hours. Cardiac Enzymes No results for input(s): TROPONINI, PROBNP in the last 168 hours. Glucose  Recent Labs Lab 06/26/15 1632 06/26/15 2007 06/26/15 2345 06/27/15 0439 06/27/15 0901 06/27/15 1252  GLUCAP 83 93 132*  119* 109* 92   Imaging No results found.   CULTURES: BCx2 2/27 , 2/28 >ng 3/2 resp >>> strep PNA (sens) 3/8 bld >> negative 3/8 resp >> nml flora\ 3/15 resp >> normal flora 3/15 blood >> 1 of 2 GPC >>  3/15 urine >> negative  LINES/TUBES: ETT 2/27>>> 3/10 Trach 3/10 >>  SIGNIFICANT EVENTS: 2/27 AMS, ? Seizure, intubated in [facility] ED, [facility] 3/05 cardene gtt 3/05 dantrolene stopped 3/10 change to pressure control  STUDIES:  CT head 2/27 >  No acute intracranial abnormality. Remote bilateral basal ganglia lacunar infarcts. Apparent soft tissue fullness in the nasopharynx could relate to retained secretions, especially given absence of mastoid effusions.  MRI brain 3/3>>>acute left basal ganglia lacunar EEG 3/6 nml  DISCUSSION: 60 yo male presented to [facility] with altered mental status.  He has hx of ETOH and CVA, and reported to smoke crack prior to admission.  Concern was he had alcohol withdrawal seizure.  Intubated for airway protection. Now w trach   I reviewed CXR myself trach in good position.  ASSESSMENT / PLAN:  NEUROLOGIC A:   Acute encephalopathy. 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 P:   Monitor mental status TEMPORARILY stopped ASA 3/16 given a possible contributor to angioedema, will restart ASA at 81 mg. Thiamine, folic acid completed Placed fentanyl patch 3/16, fentanyl gtt off  PULMONARY A: Acute respiratory failure in setting of seizures an HCAP. Failure to wean s/p trach, beginning some ATC on 3/13 Apparent angioedema, tongue enlargement P:   ATC as tolerated, Change trach from cuffed to cuffless. F/u CXR intermittently Solumedrol and pepcid given, completed  CARDIOVASCULAR A:  HTN. P:  Continue cardizem per tube ASA 81 mg per tube started 3/20, if swelling worsens then will stop and depend on another anti-plat agent.  RENAL A:  Hypernatremia. Rhabdomyolysis on admission >> resolved. P:   BMET in AM. Replace  electrolytes as indicated. Continue free water as ordered  GASTROINTESTINAL A:   Protein calorie malnutrition. P:   Continue tube feeds G-tube placed by IR 3/21. Protonix for SUP  HEMATOLOGIC A:   Anemia of chronic disease and critical illness. Leukocytosis  P:  F/u CBC SQ heparin  INFECTIOUS A:   CAP with pneumococcus in sputum. GPC on blood cx 1 of 2, ? contaminant P:   LP 3/1>>>negative Vancomycin 3/1>>>3/2 Rocephin 3/1>>>3/2>>> 3/12 Amp 3/1>>>3/2 Acyclovir 3/1>>>3/2  Completed course ceftriaxone 3/12 for pneumococcus  Repeated cx's 3/15, note 1 of 2 GPC blood cx. With dropping Pct will defer abx, follow   ENDOCRINE A:   No acute issues.   P:   Monitor blood sugar, has not required coverage  Disposition >> case manager assessing for trach SNF when PEG is in place.  Discussed with case management and RT.  Alyson Reedy, M.D. [facility] Pulmonary/Critical Care Medicine. Pager: [phone]. After hours pager: [phone].

## 2015-06-28 LAB — GLUCOSE, CAPILLARY
GLUCOSE-CAPILLARY: 143 mg/dL — AB (ref 65–99)
GLUCOSE-CAPILLARY: 140 mg/dL — AB (ref 65–99)
Glucose-Capillary: 192 mg/dL — ABNORMAL HIGH (ref 65–99)
Glucose-Capillary: 117 mg/dL — ABNORMAL HIGH (ref 65–99)
Glucose-Capillary: 99 mg/dL (ref 65–99)

## 2015-06-28 NOTE — Progress Notes (Addendum)
4pm CSW spoke with RN concerning pt suctioning needs so facility would be aware- per RN pt requiring suctioning every 1-2 hours.  CSW informed SNF- they are unable to accept with such high suctioning needs- most facilities needing maximum every 4 hours to accept  CSW will continue to follow and is exploring alternative placement options  2pm [facility] do not have [facility] available today  CSW spoke with pt sister, Carney BernJean, next choice would be [facility]- they need 24 hours after trach change- can admit pt tomorrow  Merlyn LotJenna Holoman, [facility] Clinical Social Worker [phone]

## 2015-06-28 NOTE — Plan of Care (Signed)
Problem: Consults Goal: Tracheostomy Patient/Family Teaching See Patient Education Module for education specifics. Outcome: Not Progressing Family members has not been involve in pt care.

## 2015-06-28 NOTE — Procedures (Signed)
Bedside Tracheostomy Insertion Procedure Note   Patient Details:   Name: Gregory Parker DOB: Aug 10, 1955 MRN: [id number]  Procedure: Tracheostomy  Pre Procedure Assessment: ET Tube Size: ET Tube secured at lip (cm): Bite block in place: No Breath Sounds: Rhonch  Post Procedure Assessment: BP 135/77 mmHg  Pulse 91  Temp(Src) 99 F (37.2 C) (Axillary)  Resp 18  Ht 5\' 5"  (1.651 m)  Wt 125 lb 7.1 oz (56.9 kg)  BMI 20.87 kg/m2  SpO2 97% O2 sats: stable throughout Complications: No apparent complications Patient did tolerate procedure well Tracheostomy Brand:Shiley Tracheostomy Style:Uncuffed Tracheostomy Size: #6 Tracheostomy Secured WUJ:[id number]:Velcro Tracheostomy Placement Confirmation:Trach cuff visualized and in place and Chest X ray ordered for placement   MD changed trach. Positive color. Pt and bilateral breath sounds. RT will continue to monitor.   Darolyn Ruashley M Tali Cleaves 06/28/2015, 2:36 PM

## 2015-06-28 NOTE — Progress Notes (Signed)
PULMONARY / CRITICAL CARE MEDICINE   Name: Gregory Parker MRN: [id number] DOB: 12/12/1955    ADMISSION DATE:  06/04/2015 CONSULTATION DATE:  06/04/2015  REFERRING MD:  [facility]  CHIEF COMPLAINT:  AMS  SUBJECTIVE:  Tolerating TC but increased to 35%, anticipate for the procedure only.  VITAL SIGNS: BP 135/77 mmHg  Pulse 91  Temp(Src) 99 F (37.2 C) (Axillary)  Resp 18  Ht  (1.651 m)  Wt 56.9 kg (125 lb 7.1 oz)  BMI 20.87 kg/m2  SpO2 97%  VENTILATOR SETTINGS: Vent Mode:  [-]  FiO2 (%):  [28 %] 28 %  INTAKE / OUTPUT: I/O last 3 completed shifts: In: 318.3 [NG/GT:218.3; IV Piggyback:100] Out: 2475 [Urine:2475]  PHYSICAL EXAMINATION: General: ill-appearing, on TC Neuro: opens eyes, doesn't follow commands or do anything purposeful HEENT: massive tongue swelling looks stable 3/16, improving, trach site clean Cardiac: regular Chest: b/l rhonchi Abd: soft, non tender Ext: 1+ edema Skin: no rashes  BMET  Recent Labs Lab 06/26/15 0204  NA 139  K 5.5*  CL 103  CO2 23  BUN 27*  CREATININE 0.75  GLUCOSE 147*   Electrolytes  Recent Labs Lab 06/26/15 0204  CALCIUM 9.3  MG 2.0  PHOS 5.0*   CBC  Recent Labs Lab 06/25/15 0456 06/26/15 0204  WBC 15.6* 17.6*  HGB 8.7* 8.9*  HCT 26.7* 28.5*  PLT 618* 662*   Coag's  Recent Labs Lab 06/25/15 0456  INR 1.06   Sepsis Markers  Recent Labs Lab 06/22/15 0545 06/23/15 0404  PROCALCITON 0.28 0.21   ABG  Recent Labs Lab 06/22/15 1202  PHART 7.491*  PCO2ART 35.0  PO2ART 58.0*   Liver Enzymes No results for input(s): AST, ALT, ALKPHOS, BILITOT, ALBUMIN in the last 168 hours. Cardiac Enzymes No results for input(s): TROPONINI, PROBNP in the last 168 hours. Glucose  Recent Labs Lab 06/27/15 1543 06/27/15 1947 06/27/15 2352 06/28/15 0356 06/28/15 0759 06/28/15 1256  GLUCAP 98 118* 175* 192* 143* 117*   Imaging No results found.   CULTURES: BCx2 2/27 , 2/28 >ng 3/2 resp  >>> strep PNA (sens) 3/8 bld >> negative 3/8 resp >> nml flora\ 3/15 resp >> normal flora 3/15 blood >> 1 of 2 GPC >>  3/15 urine >> negative  LINES/TUBES: ETT 2/27>>> 3/10 Trach 3/10 >>  SIGNIFICANT EVENTS: 2/27 AMS, ? Seizure, intubated in [facility], tx to [facility] ICU 3/05 cardene gtt 3/05 dantrolene stopped 3/10 change to pressure control  STUDIES:  CT head 2/27 >  No acute intracranial abnormality. Remote bilateral basal ganglia lacunar infarcts. Apparent soft tissue fullness in the nasopharynx could relate to retained secretions, especially given absence of mastoid effusions.  MRI brain 3/3>>>acute left basal ganglia lacunar EEG 3/6 nml  DISCUSSION: 60 yo male presented to [facility] with altered mental status.  He has hx of ETOH and CVA, and reported to smoke crack prior to admission.  Concern was he had alcohol withdrawal seizure.  Intubated for airway protection. Now w trach   I reviewed CXR myself trach in good position.  ASSESSMENT / PLAN:  NEUROLOGIC A:   Acute encephalopathy. 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 P:   Monitor mental status TEMPORARILY stopped ASA 3/16 given a possible contributor to angioedema, will restart ASA at 81 mg. Thiamine, folic acid completed Placed fentanyl patch 3/16, fentanyl gtt off  PULMONARY A: Acute respiratory failure in setting of seizures an HCAP. Failure to wean s/p trach, beginning some ATC on 3/13 Apparent  angioedema, tongue enlargement P:   ATC as tolerated, Change trach from cuffed to cuffless. F/u CXR intermittently Solumedrol and pepcid given, completed  CARDIOVASCULAR A:  HTN. P:  Continue cardizem per tube ASA 81 mg per tube started 3/20, if swelling worsens then will stop and depend on another anti-plat agent.  RENAL A:  Hypernatremia. Rhabdomyolysis on admission >> resolved. P:   BMET in AM. Replace electrolytes as indicated. Continue free water as ordered  GASTROINTESTINAL A:   Protein  calorie malnutrition. P:   Continue tube feeds G-tube placed by IR 3/21. Protonix for SUP  HEMATOLOGIC A:   Anemia of chronic disease and critical illness. Leukocytosis  P:  F/u CBC SQ heparin  INFECTIOUS A:   CAP with pneumococcus in sputum. GPC on blood cx 1 of 2, ? contaminant P:   LP 3/1>>>negative Vancomycin 3/1>>>3/2 Rocephin 3/1>>>3/2>>> 3/12 Amp 3/1>>>3/2 Acyclovir 3/1>>>3/2  Completed course ceftriaxone 3/12 for pneumococcus  Repeated cx's 3/15, note 1 of 2 GPC blood cx. With dropping Pct will defer abx, follow   ENDOCRINE A:   No acute issues.   P:   Monitor blood sugar, has not required coverage  Disposition >> case manager assessing for trach SNF when PEG is in place.  Discussed with case management and RT.  Will change trach to cuffless 6 today and hold for 24 hours then can transfer to SNF in AM.  Alyson ReedyWesam G. Yacoub, M.D. [facility] Pulmonary/Critical Care Medicine. Pager: [phone]. After hours pager: [phone].

## 2015-06-28 NOTE — Plan of Care (Signed)
Problem: Phase I Progression Outcomes Goal: Free from Obvious Signs of Infection Outcome: Not Progressing Pt has current pneumococcal infection.

## 2015-06-28 NOTE — Progress Notes (Signed)
Pt had 150ml gastric residual from PEG twice today. Reporting nurse did not titrate feeding up to goal of 6970ml/hr. Kept rate at 6960ml/hr and will continue to monitor.

## 2015-06-28 NOTE — Procedures (Signed)
First Trach Change  Sutures removed, size 6 cuffed shiley removed and replaced by a size 6 cuffless with good air movement bilaterally and good color change.  Alyson ReedyWesam G. Yacoub, M.D. [facility] Pulmonary/Critical Care Medicine. Pager: [phone]. After hours pager: [phone].

## 2015-06-29 LAB — GLUCOSE, CAPILLARY
GLUCOSE-CAPILLARY: 143 mg/dL — AB (ref 65–99)
GLUCOSE-CAPILLARY: 129 mg/dL — AB (ref 65–99)
GLUCOSE-CAPILLARY: 117 mg/dL — AB (ref 65–99)
Glucose-Capillary: 144 mg/dL — ABNORMAL HIGH (ref 65–99)
Glucose-Capillary: 199 mg/dL — ABNORMAL HIGH (ref 65–99)
Glucose-Capillary: 200 mg/dL — ABNORMAL HIGH (ref 65–99)

## 2015-06-29 NOTE — Progress Notes (Signed)
To say trach secretions are copious would be an understatement.  Pt requires constant suctioning and trach care.

## 2015-06-29 NOTE — Progress Notes (Signed)
PULMONARY / CRITICAL CARE MEDICINE   Name: Gregory Parker MRN: [id number] DOB: 02/02/1956    ADMISSION DATE:  06/04/2015 CONSULTATION DATE:  06/04/2015  REFERRING MD:  EDP at [facility]  CHIEF COMPLAINT:  AMS  SUBJECTIVE:  Tolerating TC but increased to 35%, anticipate for the procedure only.  VITAL SIGNS: BP 130/62 mmHg  Pulse 71  Temp(Src) 99 F (37.2 C) (Oral)  Resp 24  Ht  (1.651 m)  Wt 56.2 kg (123 lb 14.4 oz)  BMI 20.62 kg/m2  SpO2 100%  VENTILATOR SETTINGS: Vent Mode:  [-]  FiO2 (%):  [28 %] 28 %  INTAKE / OUTPUT: I/O last 3 completed shifts: In: 1632.7 [NG/GT:1632.7] Out: 2665 [Urine:2365; Drains:300]  PHYSICAL EXAMINATION: General: ill-appearing, on TC Neuro: opens eyes, doesn't follow commands or do anything purposeful HEENT: massive tongue swelling looks stable 3/16, improving, trach site clean Cardiac: regular Chest: b/l rhonchi Abd: soft, non tender Ext: 1+ edema Skin: no rashes  BMET  Recent Labs Lab 06/26/15 0204  NA 139  K 5.5*  CL 103  CO2 23  BUN 27*  CREATININE 0.75  GLUCOSE 147*   Electrolytes  Recent Labs Lab 06/26/15 0204  CALCIUM 9.3  MG 2.0  PHOS 5.0*   CBC  Recent Labs Lab 06/25/15 0456 06/26/15 0204  WBC 15.6* 17.6*  HGB 8.7* 8.9*  HCT 26.7* 28.5*  PLT 618* 662*   Coag's  Recent Labs Lab 06/25/15 0456  INR 1.06   Sepsis Markers  Recent Labs Lab 06/23/15 0404  PROCALCITON 0.21   ABG No results for input(s): PHART, PCO2ART, PO2ART in the last 168 hours. Liver Enzymes No results for input(s): AST, ALT, ALKPHOS, BILITOT, ALBUMIN in the last 168 hours. Cardiac Enzymes No results for input(s): TROPONINI, PROBNP in the last 168 hours. Glucose  Recent Labs Lab 06/28/15 1256 06/28/15 1609 06/28/15 2040 06/29/15 0321 06/29/15 0742 06/29/15 1217  GLUCAP 117* 99 140* 143* 144* 129*   Imaging No results found.   CULTURES: BCx2 2/27 , 2/28 >ng 3/2 resp >>> strep PNA (sens) 3/8 bld >>  negative 3/8 resp >> nml flora\ 3/15 resp >> normal flora 3/15 blood >> 1 of 2 GPC >>  3/15 urine >> negative  LINES/TUBES: ETT 2/27>>> 3/10 Trach 3/10 >>  SIGNIFICANT EVENTS: 2/27 AMS, ? Seizure, intubated in [facility], tx to [facility] ICU 3/05 cardene gtt 3/05 dantrolene stopped 3/10 change to pressure control  STUDIES:  CT head 2/27 >  No acute intracranial abnormality. Remote bilateral basal ganglia lacunar infarcts. Apparent soft tissue fullness in the nasopharynx could relate to retained secretions, especially given absence of mastoid effusions.  MRI brain 3/3>>>acute left basal ganglia lacunar EEG 3/6 nml  DISCUSSION: 60 yo male presented to [facility] with altered mental status.  He has hx of ETOH and CVA, and reported to smoke crack prior to admission.  Concern was he had alcohol withdrawal seizure.  Intubated for airway protection. Now w trach   I reviewed CXR myself trach in good position.  ASSESSMENT / PLAN:  NEUROLOGIC A:   Acute encephalopathy. 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 P:   Monitor mental status TEMPORARILY stopped ASA 3/16 given a possible contributor to angioedema, will restart ASA at 81 mg. Thiamine, folic acid completed Placed fentanyl patch 3/16, fentanyl gtt off  PULMONARY A: Acute respiratory failure in setting of seizures an HCAP. Failure to wean s/p trach, beginning some ATC on 3/13 Apparent angioedema, tongue enlargement P:   ATC as tolerated.  Increased secretion with frequent section. F/u CXR intermittently. Solumedrol and pepcid given, completed. Diureses.  CARDIOVASCULAR A:  HTN. P:  Continue cardizem per tube ASA 81 mg per tube started 3/20, if swelling worsens then will stop and depend on another anti-plat agent.  RENAL A:  Hypernatremia. Rhabdomyolysis on admission >> resolved. P:   BMET in AM. Replace electrolytes as indicated. Continue free water as ordered Lasix 40 mg IV x1.  GASTROINTESTINAL A:    Protein calorie malnutrition. P:   Continue tube feeds G-tube placed by IR 3/21. Protonix for SUP  HEMATOLOGIC A:   Anemia of chronic disease and critical illness. Leukocytosis  P:  F/u CBC SQ heparin  INFECTIOUS A:   CAP with pneumococcus in sputum. GPC on blood cx 1 of 2, ? contaminant P:   LP 3/1>>>negative Vancomycin 3/1>>>3/2 Rocephin 3/1>>>3/2>>> 3/12 Amp 3/1>>>3/2 Acyclovir 3/1>>>3/2  Completed course ceftriaxone 3/12 for pneumococcus  Repeated cx's 3/15, note 1 of 2 GPC blood cx. With dropping Pct will defer abx, follow   ENDOCRINE A:   No acute issues.   P:   Monitor blood sugar, has not required coverage  Disposition >> case manager assessing for trach SNF when PEG is in place.  Discussed with case management and RT.  Changed trach to cuffless yesterday and well tolerated.  Awaiting decrease in secretion prior to transfer to trach/SNF  Alyson ReedyWesam G. Yacoub, M.D. [facility] Medicine. Pager: [phone]. After hours pager: [phone].

## 2015-06-30 ENCOUNTER — Inpatient Hospital Stay (HOSPITAL_COMMUNITY): Payer: Medicare Other

## 2015-06-30 ENCOUNTER — Encounter (HOSPITAL_COMMUNITY): Payer: Self-pay | Admitting: *Deleted

## 2015-06-30 DIAGNOSIS — Z931 Gastrostomy status: Secondary | ICD-10-CM

## 2015-06-30 LAB — GLUCOSE, CAPILLARY
GLUCOSE-CAPILLARY: 112 mg/dL — AB (ref 65–99)
GLUCOSE-CAPILLARY: 122 mg/dL — AB (ref 65–99)
Glucose-Capillary: 168 mg/dL — ABNORMAL HIGH (ref 65–99)
Glucose-Capillary: 127 mg/dL — ABNORMAL HIGH (ref 65–99)
Glucose-Capillary: 144 mg/dL — ABNORMAL HIGH (ref 65–99)

## 2015-06-30 LAB — CBC
HEMATOCRIT: 29.4 % — AB (ref 39.0–52.0)
HEMOGLOBIN: 9.4 g/dL — AB (ref 13.0–17.0)
MCH: 29.5 pg (ref 26.0–34.0)
MCHC: 32 g/dL (ref 30.0–36.0)
MCV: 92.2 fL (ref 78.0–100.0)
Platelets: 479 10*3/uL — ABNORMAL HIGH (ref 150–400)
RBC: 3.19 MIL/uL — AB (ref 4.22–5.81)
RDW: 15.3 % (ref 11.5–15.5)
WBC: 29.1 10*3/uL — ABNORMAL HIGH (ref 4.0–10.5)

## 2015-06-30 LAB — BASIC METABOLIC PANEL
Anion gap: 9 (ref 5–15)
BUN: 22 mg/dL — AB (ref 6–20)
CHLORIDE: 103 mmol/L (ref 101–111)
CO2: 25 mmol/L (ref 22–32)
Calcium: 9.1 mg/dL (ref 8.9–10.3)
Creatinine, Ser: 0.67 mg/dL (ref 0.61–1.24)
GFR calc Af Amer: >60 mL/min (ref >60)
GFR calc non Af Amer: >60 mL/min (ref >60)
GLUCOSE: 156 mg/dL — AB (ref 65–99)
POTASSIUM: 4.8 mmol/L (ref 3.5–5.1)
Sodium: 137 mmol/L (ref 135–145)

## 2015-06-30 LAB — PROCALCITONIN: PROCALCITONIN: 0.16 ng/mL

## 2015-06-30 LAB — MAGNESIUM: MAGNESIUM: 1.8 mg/dL (ref 1.7–2.4)

## 2015-06-30 LAB — PHOSPHORUS: Phosphorus: 3.1 mg/dL (ref 2.5–4.6)

## 2015-06-30 MED ORDER — METHYLPREDNISOLONE SODIUM SUCC 40 MG IJ SOLR
40.0000 mg | INTRAMUSCULAR | Status: DC
Start: 2015-06-30 — End: 2015-07-01
  Administered 2015-06-30: 40 mg via INTRAVENOUS
  Filled 2015-06-30: qty 1

## 2015-06-30 MED ORDER — NYSTATIN 100000 UNIT/ML MT SUSP
5.0000 mL | Freq: Four times a day (QID) | OROMUCOSAL | Status: DC
  Administered 2015-06-30 – 2015-07-12 (×41): 500000 [IU] via ORAL
  Filled 2015-06-30 (×34): qty 5

## 2015-06-30 MED ORDER — SENNOSIDES-DOCUSATE SODIUM 8.6-50 MG PO TABS
2.0000 | ORAL_TABLET | Freq: Two times a day (BID) | ORAL | Status: DC
  Administered 2015-06-30 – 2015-07-07 (×15): 2
  Filled 2015-06-30 (×16): qty 2

## 2015-06-30 NOTE — Progress Notes (Signed)
PROGRESS NOTE  Gregory Parker ZOX:[id number] DOB: October 18, 1955 DOA: 06/04/2015 PCP: Isabella Stalling, MD  HPI/Recap of past 24 hours:   alert, with trach, peg, not following command, chronic contracture,  Assessment/Plan: Active Problems:   Acute respiratory failure (HCC)   Seizures (HCC)   Encounter for intubation   Status epilepticus (HCC)   Alcohol abuse   Malignant hypertension   Cerebral thrombosis with cerebral infarction   Coma (HCC)   Alcohol withdrawal (HCC)   Pneumonia   Acute encephalopathy   Stroke [facility])   Acute respiratory failure with hypoxemia (HCC)  Acute encephalopathy/alcohol withdrawal seizure/neuroleptic malignant syndrome/maligant hyperthermia/old left basal ganglia infarct,  Patient was intubated for airway protection on 2/27 in [facility]. Now s/p trach and peg, on tube feeds. ldl 63, a1c 5.8 s/p dantrolene from 3/2 to 3/5, with reported dramatic improvement with spasticity and ck level.  Patient was initially treated with keppra which was later discontinued on 3/3 due to no evidence of seizure with unremarkable EEG.   Acute respiratory failure with pneumococcus pneumonia, possible angioedema during hospitalization, patient has finished abx course, failure to wean, now s/p trach and peg. Taper steroids, continue nebs. No scopolamine patch for secretion control. Trach care per pccm and respiratory.  Fever; intermittent fever, last fever 102  on 3/24 3pm and 101.5 on 3/24 7pm. Repeat ua, cxr, check pct level  HTN; stable on norvasc and ccb. On asa.  rhabdomyolysis on admission: resolved.  Leukocytosis: steroids induced? Taper steroids.  Patient was on fentanyl drip then On fentanyl patch, for pain control? Currently does not seem in pain, consider taper off.   Nutrition: tube feeds   Code Status: full  Family Communication: patient   Disposition Plan: snf eventually   Consultants:  Intubated at [facility] on 2/27 and admitted [facility] on  2/27, transfer to [facility] on 3/25  Neurology  (signed off)  IR for peg placement  Procedures:  Intubation on 2/27  Bronchoscopy and trach on 3/10  Lumbar puncture on 3/1, negative  Peg placement on 3/21  EEG    Antibiotics: Vancomycin 3/1>>>3/2 Rocephin 3/1>>>3/2>>> 3/12 Amp 3/1>>>3/2 Acyclovir 3/1>>>3/2 Above for empiric coverage for meningitis, these was d/ed after negative lumbar puncture.  Completed course ceftriaxone 3/12 for pneumococcus  Repeated cx's 3/15, note 1 of 2 GPC blood cx. With dropping Pct will defer abx, follow   Objective: BP 117/98 mmHg  Pulse 98  Temp(Src) 100.1 F (37.8 C) (Axillary)  Resp 25  Ht  (1.651 m)  Wt 57.2 kg (126 lb 1.7 oz)  BMI 20.98 kg/m2  SpO2 96%  Intake/Output Summary (Last 24 hours) at 06/30/15 1253 Last data filed at 06/30/15 0600  Gross per 24 hour  Intake   1760 ml  Output   1650 ml  Net    110 ml   Filed Weights   06/28/15 0432 06/29/15 0454 06/30/15 0404  Weight: 56.9 kg (125 lb 7.1 oz) 56.2 kg (123 lb 14.4 oz) 57.2 kg (126 lb 1.7 oz)    Exam:   General:  NAD  Cardiovascular: RRR  Respiratory: CTABL  Abdomen: Soft/ND/NT, positive BS  Musculoskeletal: No Edema  Neuro:   Data Reviewed: Basic Metabolic Panel:  Recent Labs Lab 06/26/15 0204 06/30/15 0523  NA 139 137  K 5.5* 4.8  CL 103 103  CO2 23 25  GLUCOSE 147* 156*  BUN 27* 22*  CREATININE 0.75 0.67  CALCIUM 9.3 9.1  MG 2.0 1.8  PHOS 5.0* 3.1  Liver Function Tests: No results for input(s): AST, ALT, ALKPHOS, BILITOT, PROT, ALBUMIN in the last 168 hours. No results for input(s): LIPASE, AMYLASE in the last 168 hours. No results for input(s): AMMONIA in the last 168 hours. CBC:  Recent Labs Lab 06/25/15 0456 06/26/15 0204 06/30/15 0523  WBC 15.6* 17.6* 29.1*  HGB 8.7* 8.9* 9.4*  HCT 26.7* 28.5* 29.4*  MCV 91.4 91.1 92.2  PLT 618* 662* 479*   Cardiac Enzymes:   No results for input(s): CKTOTAL, CKMB, CKMBINDEX,  TROPONINI in the last 168 hours. BNP (last 3 results) No results for input(s): BNP in the last 8760 hours.  ProBNP (last 3 results) No results for input(s): PROBNP in the last 8760 hours.  CBG:  Recent Labs Lab 06/29/15 1633 06/29/15 2121 06/29/15 2330 06/30/15 0453 06/30/15 0820  GLUCAP 117* 199* 200* 168* 112*    Recent Results (from the past 240 hour(s))  Culture, respiratory (NON-Expectorated)     Status: None   Collection Time: 06/20/15  2:58 PM  Result Value Ref Range Status   Specimen Description TRACHEAL ASPIRATE  Final   Special Requests NONE  Final   Gram Stain   Final    ABUNDANT WBC PRESENT, PREDOMINANTLY PMN FEW SQUAMOUS EPITHELIAL CELLS PRESENT FEW GRAM POSITIVE COCCI IN PAIRS Performed at [facility]    Culture   Final    NORMAL OROPHARYNGEAL FLORA Performed at [facility]    Report Status 06/22/2015 FINAL  Final  Urine culture     Status: None   Collection Time: 06/20/15  3:21 PM  Result Value Ref Range Status   Specimen Description URINE, CLEAN CATCH  Final   Special Requests NONE  Final   Culture NO GROWTH 1 DAY  Final   Report Status 06/21/2015 FINAL  Final  Culture, blood (Routine X 2) w Reflex to ID Panel     Status: None   Collection Time: 06/20/15  3:45 PM  Result Value Ref Range Status   Specimen Description BLOOD LEFT HAND  Final   Special Requests IN PEDIATRIC BOTTLE 2.5CC  Final   Culture  Setup Time   Final    GRAM POSITIVE COCCI IN CLUSTERS IN PEDIATRIC BOTTLE CRITICAL RESULT CALLED TO, READ BACK BY AND VERIFIED WITH: Bertram DenverM. Furr RN 17:50 06/21/15 ([email or web address])    Culture   Final    STAPHYLOCOCCUS SPECIES (COAGULASE NEGATIVE) THE SIGNIFICANCE OF ISOLATING THIS ORGANISM FROM A SINGLE SET OF BLOOD CULTURES WHEN MULTIPLE SETS ARE DRAWN IS UNCERTAIN. PLEASE NOTIFY THE MICROBIOLOGY DEPARTMENT WITHIN ONE WEEK IF SPECIATION AND SENSITIVITIES ARE REQUIRED.    Report Status 06/23/2015 FINAL  Final  Culture, blood (Routine X 2) w  Reflex to ID Panel     Status: None   Collection Time: 06/20/15  3:56 PM  Result Value Ref Range Status   Specimen Description BLOOD RIGHT HAND  Final   Special Requests IN PEDIATRIC BOTTLE 2.5CC  Final   Culture NO GROWTH 5 DAYS  Final   Report Status 06/25/2015 FINAL  Final  Culture, respiratory (NON-Expectorated)     Status: None   Collection Time: 06/23/15  3:33 PM  Result Value Ref Range Status   Specimen Description TRACHEAL ASPIRATE  Final   Special Requests Normal  Final   Gram Stain   Final    ABUNDANT WCBP FEW SQUAMOUS EPITHELIAL CELLS PRESENT MODERATE GRAM NEGATIVE RODS RARE GRAM POSITIVE COCCI IN PAIRS THIS SPECIMEN IS ACCEPTABLE FOR SPUTUM CULTURE Performed at [facility]  Culture   Final    NORMAL OROPHARYNGEAL FLORA Performed at [facility]    Report Status 06/26/2015 FINAL  Final     Studies: No results found.  Scheduled Meds: . amLODipine  5 mg Per Tube Daily  . aspirin  81 mg Oral Daily  . diltiazem  60 mg Per Tube 4 times per day  . famotidine (PEPCID) IV  20 mg Intravenous Q24H  . fentaNYL  75 mcg Transdermal Q72H  . heparin  5,000 Units Subcutaneous 3 times per day  . levalbuterol  0.63 mg Nebulization Q6H  . methylPREDNISolone (SOLU-MEDROL) injection  40 mg Intravenous Q24H  . multivitamin with minerals  1 tablet Per Tube Daily  . nystatin  5 mL Oral QID  . scopolamine  1 patch Transdermal Q72H  . senna-docusate  2 tablet Per Tube BID    Continuous Infusions: . feeding supplement (VITAL AF 1.2 CAL) 1,000 mL (06/30/15 0100)     Time spent:  Kamyla Olejnik MD, PhD  [facility] Pager [phone]. If 7PM-7AM, please contact night-coverage at [email or web address], password [facility] 06/30/2015, 12:53 PM  LOS: 26 days

## 2015-06-30 NOTE — Plan of Care (Signed)
Problem: Education: Goal: Knowledge of disease or condition will improve Outcome: Progressing Discussed with sister the patient's day.

## 2015-07-01 ENCOUNTER — Inpatient Hospital Stay (HOSPITAL_COMMUNITY): Payer: Medicare Other

## 2015-07-01 LAB — CBC
HEMATOCRIT: 29.2 % — AB (ref 39.0–52.0)
Hemoglobin: 9.3 g/dL — ABNORMAL LOW (ref 13.0–17.0)
MCH: 29.5 pg (ref 26.0–34.0)
MCHC: 31.8 g/dL (ref 30.0–36.0)
MCV: 92.7 fL (ref 78.0–100.0)
PLATELETS: 416 10*3/uL — AB (ref 150–400)
RBC: 3.15 MIL/uL — ABNORMAL LOW (ref 4.22–5.81)
RDW: 15.6 % — AB (ref 11.5–15.5)
WBC: 19.9 10*3/uL — AB (ref 4.0–10.5)

## 2015-07-01 LAB — COMPREHENSIVE METABOLIC PANEL
ALBUMIN: 2.4 g/dL — AB (ref 3.5–5.0)
ALK PHOS: 73 U/L (ref 38–126)
ALT: 98 U/L — ABNORMAL HIGH (ref 17–63)
ANION GAP: 11 (ref 5–15)
AST: 56 U/L — ABNORMAL HIGH (ref 15–41)
BILIRUBIN TOTAL: 0.3 mg/dL (ref 0.3–1.2)
BUN: 24 mg/dL — ABNORMAL HIGH (ref 6–20)
CALCIUM: 9 mg/dL (ref 8.9–10.3)
CO2: 23 mmol/L (ref 22–32)
Chloride: 103 mmol/L (ref 101–111)
Creatinine, Ser: 0.6 mg/dL — ABNORMAL LOW (ref 0.61–1.24)
Glucose, Bld: 222 mg/dL — ABNORMAL HIGH (ref 65–99)
POTASSIUM: 4.6 mmol/L (ref 3.5–5.1)
Sodium: 137 mmol/L (ref 135–145)
TOTAL PROTEIN: 6.8 g/dL (ref 6.5–8.1)

## 2015-07-01 LAB — HEPATIC FUNCTION PANEL
ALBUMIN: 2.3 g/dL — AB (ref 3.5–5.0)
ALT: 88 U/L — ABNORMAL HIGH (ref 17–63)
AST: 47 U/L — ABNORMAL HIGH (ref 15–41)
Alkaline Phosphatase: 74 U/L (ref 38–126)
BILIRUBIN DIRECT: 0.1 mg/dL (ref 0.1–0.5)
BILIRUBIN TOTAL: 0.3 mg/dL (ref 0.3–1.2)
Indirect Bilirubin: 0.2 mg/dL — ABNORMAL LOW (ref 0.3–0.9)
Total Protein: 6.4 g/dL — ABNORMAL LOW (ref 6.5–8.1)

## 2015-07-01 LAB — URINALYSIS, ROUTINE W REFLEX MICROSCOPIC
BILIRUBIN URINE: NEGATIVE
Glucose, UA: NEGATIVE mg/dL
HGB URINE DIPSTICK: NEGATIVE
Ketones, ur: NEGATIVE mg/dL
Leukocytes, UA: NEGATIVE
NITRITE: NEGATIVE
PROTEIN: NEGATIVE mg/dL
SPECIFIC GRAVITY, URINE: 1.02 (ref 1.005–1.030)
pH: 8.5 — ABNORMAL HIGH (ref 5.0–8.0)

## 2015-07-01 LAB — VITAMIN B12: VITAMIN B 12: 847 pg/mL (ref 180–914)

## 2015-07-01 LAB — TSH: TSH: 3.259 u[IU]/mL (ref 0.350–4.500)

## 2015-07-01 LAB — GLUCOSE, CAPILLARY
GLUCOSE-CAPILLARY: 153 mg/dL — AB (ref 65–99)
GLUCOSE-CAPILLARY: 167 mg/dL — AB (ref 65–99)
GLUCOSE-CAPILLARY: 173 mg/dL — AB (ref 65–99)
GLUCOSE-CAPILLARY: 177 mg/dL — AB (ref 65–99)
GLUCOSE-CAPILLARY: 97 mg/dL (ref 65–99)
Glucose-Capillary: 166 mg/dL — ABNORMAL HIGH (ref 65–99)

## 2015-07-01 LAB — FOLATE: FOLATE: 34.1 ng/mL (ref >5.9)

## 2015-07-01 MED ORDER — FUROSEMIDE 10 MG/ML IJ SOLN
40.0000 mg | Freq: Once | INTRAMUSCULAR | Status: AC
  Administered 2015-07-01: 40 mg via INTRAVENOUS
  Filled 2015-07-01: qty 4

## 2015-07-01 MED ORDER — PREDNISONE 20 MG PO TABS
30.0000 mg | ORAL_TABLET | Freq: Every day | ORAL | Status: DC
  Administered 2015-07-01 – 2015-07-02 (×2): 30 mg via ORAL
  Filled 2015-07-01 (×2): qty 1

## 2015-07-01 MED ORDER — GLYCOPYRROLATE 1 MG PO TABS
1.0000 mg | ORAL_TABLET | Freq: Two times a day (BID) | ORAL | Status: AC
  Administered 2015-07-01 – 2015-07-02 (×4): 1 mg
  Filled 2015-07-01 (×5): qty 1

## 2015-07-01 MED ORDER — INSULIN ASPART 100 UNIT/ML ~~LOC~~ SOLN
0.0000 [IU] | Freq: Three times a day (TID) | SUBCUTANEOUS | Status: DC
  Administered 2015-07-01: 2 [IU] via SUBCUTANEOUS

## 2015-07-01 MED ORDER — GUAIFENESIN ER 600 MG PO TB12
600.0000 mg | ORAL_TABLET | Freq: Two times a day (BID) | ORAL | Status: DC
  Administered 2015-07-01 – 2015-07-05 (×10): 600 mg via ORAL
  Filled 2015-07-01 (×10): qty 1

## 2015-07-01 MED ORDER — FREE WATER
200.0000 mL | Freq: Four times a day (QID) | Status: DC
  Administered 2015-07-01 – 2015-07-02 (×6): 200 mL

## 2015-07-01 MED ORDER — POLYETHYLENE GLYCOL 3350 17 G PO PACK
17.0000 g | PACK | Freq: Every day | ORAL | Status: DC
Start: 2015-07-01 — End: 2015-07-06
  Administered 2015-07-01 – 2015-07-06 (×6): 17 g via ORAL
  Filled 2015-07-01 (×6): qty 1

## 2015-07-01 MED ORDER — MINERAL OIL RE ENEM
1.0000 | ENEMA | Freq: Once | RECTAL | Status: AC
  Administered 2015-07-01: 1 via RECTAL
  Filled 2015-07-01: qty 1

## 2015-07-01 NOTE — Plan of Care (Signed)
Problem: Tissue Perfusion: Goal: Cerebral tissue perfusion will improve Outcome: Progressing Try to provide better IV access for the patient

## 2015-07-01 NOTE — Progress Notes (Addendum)
PROGRESS NOTE  Gregory Parker ZOX:[id number] DOB: November 28, 1955 DOA: 06/04/2015 PCP: Gregory StallingNDIEGO,Gregory Parker  HPI/Recap of past 24 hours:  alert, with trach, peg, not following command, chronic contracture, still large amount of oral secretion, tmax last 24hr 100.7, no bm since 3/21  Assessment/Plan: Active Problems:   Acute respiratory failure (HCC)   Seizures (HCC)   Encounter for intubation   Status epilepticus (HCC)   Alcohol abuse   Malignant hypertension   Cerebral thrombosis with cerebral infarction   Coma (HCC)   Alcohol withdrawal (HCC)   Pneumonia   Acute encephalopathy   Stroke [facility](HCC)   Acute respiratory failure with hypoxemia (HCC)  Acute encephalopathy/alcohol withdrawal seizure/neuroleptic malignant syndrome/maligant hyperthermia/old left basal ganglia infarct,  Patient was intubated for airway protection on 2/27 in [facility]. Now s/p trach and peg, on tube feeds. ldl 63, a1c 5.8 s/p dantrolene from 3/2 to 3/5, with reported dramatic improvement with spasticity and ck level.  Patient was initially treated with keppra which was later discontinued on 3/3 due to no evidence of seizure with unremarkable EEG.   Acute respiratory failure with pneumococcus pneumonia, possible angioedema during hospitalization (possible from [facility]), patient has finished abx course, failure to wean, now s/p trach and peg. Taper steroids, continue nebs. On scopolamine patch for secretion control, add glycopyrrolate, Trach care per pccm and respiratory.   Fever; intermittent fever, last fever 102  on 3/24 3pm and 101.5 on 3/24 7pm. tmax 100.7 on 3/25-3/26, Repeat ua unremarkable,  Cxr+ infiltrate vs pulmonary,  pct continue to decrease, will give lasix, decrease free water flush, hopefully this will help airway secretion as well.  HTN; stable on norvasc and ccb. On asa.  rhabdomyolysis on admission: resolved.  Leukocytosis: steroids induced? Taper steroids.  Patient was on fentanyl drip then On  fentanyl patch, for pain control? Currently does not seem in pain, consider taper off.  Constipation: no bm since 3/21, kub no obstruction, enema ordered.   Nutrition: tube feeds   Code Status: full  Family Communication: patient   Disposition Plan: snf eventually   Consultants:  Int[facility]ated at [facility] on 2/27 and [facility]  Neurology  (signed off)  IR for peg placement  Procedures:  Intubation on 2/27  Bronchoscopy and trach on 3/10  Lumbar puncture on 3/1, negative  Peg placement on 3/21  EEG    Antibiotics: Vancomycin 3/1>>>3/2 Rocephin 3/1>>>3/2>>> 3/12 Amp 3/1>>>3/2 Acyclovir 3/1>>>3/2 Above for empiric coverage for meningitis, these was d/ed after negative lumbar puncture.  Completed course ceftriaxone 3/12 for pneumococcus  Repeated cx's 3/15, note 1 of 2 GPC blood cx. With dropping Pct will defer abx, follow   Objective: BP 130/63 mmHg  Pulse 71  Temp(Src) 99 F (37.2 C) (Oral)  Resp 21  Ht 5\' 5"  (1.651 Parker)  Wt 57.4 kg (126 lb 8.7 oz)  BMI 21.06 kg/m2  SpO2 100%  Intake/Output Summary (Last 24 hours) at 07/01/15 0755 Last data filed at 07/01/15 0400  Gross per 24 hour  Intake    650 ml  Output   2175 ml  Net  -1525 ml   Filed Weights   06/29/15 0454 06/30/15 0404 07/01/15 0430  Weight: 56.2 kg (123 lb 14.4 oz) 57.2 kg (126 lb 1.7 oz) 57.4 kg (126 lb 8.7 oz)    Exam:   General:  S/p trach, peg, open eyes to voice, oral thrush  Cardiovascular: RRR, few pac's  Respiratory: large amount of  airway secretion need frequent suction, +rhonchi  Abdomen: Soft/ND/NT, positive BS, s/p peg  Musculoskeletal: No Edema, orthotic boots on left foot to prevent foot drop  Neuro: open eyes, not following command, chronic contracture, right side contracture, moving left side.  Data Reviewed: Basic Metabolic Panel:  Recent Labs Lab 06/26/15 0204 06/30/15 0523 07/01/15 0335  NA 139 137 137  K  5.5* 4.8 4.6  CL 103 103 103  CO2 GLUCOSE 147* 156* 222*  BUN 27* 22* 24*  CREATININE 0.75 0.67 0.60*  CALCIUM 9.3 9.1 9.0  MG 2.0 1.8  --   PHOS 5.0* 3.1  --    Liver Function Tests:  Recent Labs Lab 07/01/15 0335  AST 56*  ALT 98*  ALKPHOS 73  BILITOT 0.3  PROT 6.8  ALBUMIN 2.4*   No results for input(s): LIPASE, AMYLASE in the last 168 hours. No results for input(s): AMMONIA in the last 168 hours. CBC:  Recent Labs Lab 06/25/15 0456 06/26/15 0204 06/30/15 0523  WBC 15.6* 17.6* 29.1*  HGB 8.7* 8.9* 9.4*  HCT 26.7* 28.5* 29.4*  MCV 91.4 91.1 92.2  PLT 618* 662* 479*   Cardiac Enzymes:   No results for input(s): CKTOTAL, CKMB, CKMBINDEX, TROPONINI in the last 168 hours. BNP (last 3 results) No results for input(s): BNP in the last 8760 hours.  ProBNP (last 3 results) No results for input(s): PROBNP in the last 8760 hours.  CBG:  Recent Labs Lab 06/30/15 1633 06/30/15 2053 07/01/15 0046 07/01/15 0447 07/01/15 0720  GLUCAP 127* 144* 177* 173* 153*    Recent Results (from the past 240 hour(s))  Culture, respiratory (NON-Expectorated)     Status: None   Collection Time: 06/23/15  3:33 PM  Result Value Ref Range Status   Specimen Description TRACHEAL ASPIRATE  Final   Special Requests Normal  Final   Gram Stain   Final    ABUNDANT WCBP FEW SQUAMOUS EPITHELIAL CELLS PRESENT MODERATE GRAM NEGATIVE RODS RARE GRAM POSITIVE COCCI IN PAIRS THIS SPECIMEN IS ACCEPTABLE FOR SPUTUM CULTURE Performed at [facility]    Culture   Final    NORMAL OROPHARYNGEAL FLORA Performed at [facility]    Report Status 06/26/2015 FINAL  Final     Studies: Dg Chest Port 1 View  06/30/2015  CLINICAL DATA:  Fever EXAM: PORTABLE CHEST 1 VIEW COMPARISON:  06/23/2015 FINDINGS: Cardiomediastinal silhouette is stable. Tracheostomy tube unchanged in position. Again noted bilateral patchy airspace disease bilaterally and streaky interstitial  prominence suspicious for multifocal pneumonia. Superimposed mild interstitial edema cannot be excluded. Clinical correlation is necessary. IMPRESSION: Again noted bilateral patchy airspace disease bilaterally and streaky interstitial prominence suspicious for multifocal pneumonia. Superimposed mild interstitial edema cannot be excluded. Clinical correlation is necessary. Electronically Signed   By: Natasha Mead Parker.D.   On: 06/30/2015 15:39    Scheduled Meds: . amLODipine  5 mg Per Tube Daily  . aspirin  81 mg Oral Daily  . diltiazem  60 mg Per Tube 4 times per day  . famotidine (PEPCID) IV  20 mg Intravenous Q24H  . fentaNYL  75 mcg Transdermal Q72H  . furosemide  40 mg Intravenous Once  . glycopyrrolate  1 mg Per Tube BID  . guaiFENesin  600 mg Oral BID  . heparin  5,000 Units Subcutaneous 3 times per day  . levalbuterol  0.63 mg Nebulization Q6H  . multivitamin with minerals  1 tablet Per Tube Daily  . nystatin  5  mL Oral QID  . polyethylene glycol  17 g Oral Daily  . predniSONE  30 mg Oral Q breakfast  . scopolamine  1 patch Transdermal Q72H  . senna-docusate  2 tablet Per Tube BID    Continuous Infusions: . feeding supplement (VITAL AF 1.2 CAL) 1,000 mL (07/01/15 0430)     Time spent:  Stehanie Ekstrom MD, PhD  [facility] Pager [phone]. If 7PM-7AM, please contact night-coverage at [email or web address], password [facility] 07/01/2015, 7:55 AM  LOS: 27 days

## 2015-07-01 NOTE — Plan of Care (Signed)
Problem: Phase I Progression Outcomes Goal: [facility] Outcome: Not Progressing APatient having large amount of secretions coming from his trach and at risk for skin breakdown and aspiration; would patient benefit from atropine?

## 2015-07-02 DIAGNOSIS — Z430 Encounter for attention to tracheostomy: Secondary | ICD-10-CM | POA: Diagnosis present

## 2015-07-02 DIAGNOSIS — E46 Unspecified protein-calorie malnutrition: Secondary | ICD-10-CM | POA: Diagnosis present

## 2015-07-02 DIAGNOSIS — Z931 Gastrostomy status: Secondary | ICD-10-CM | POA: Diagnosis present

## 2015-07-02 DIAGNOSIS — K5909 Other constipation: Secondary | ICD-10-CM

## 2015-07-02 DIAGNOSIS — K5900 Constipation, unspecified: Secondary | ICD-10-CM | POA: Diagnosis present

## 2015-07-02 LAB — GLUCOSE, CAPILLARY
GLUCOSE-CAPILLARY: 129 mg/dL — AB (ref 65–99)
GLUCOSE-CAPILLARY: 137 mg/dL — AB (ref 65–99)
GLUCOSE-CAPILLARY: 183 mg/dL — AB (ref 65–99)
Glucose-Capillary: 135 mg/dL — ABNORMAL HIGH (ref 65–99)
Glucose-Capillary: 173 mg/dL — ABNORMAL HIGH (ref 65–99)
Glucose-Capillary: 107 mg/dL — ABNORMAL HIGH (ref 65–99)

## 2015-07-02 LAB — MAGNESIUM: MAGNESIUM: 1.8 mg/dL (ref 1.7–2.4)

## 2015-07-02 LAB — BASIC METABOLIC PANEL
ANION GAP: 12 (ref 5–15)
BUN: 24 mg/dL — AB (ref 6–20)
CO2: 25 mmol/L (ref 22–32)
CREATININE: 0.72 mg/dL (ref 0.61–1.24)
Calcium: 9.1 mg/dL (ref 8.9–10.3)
Chloride: 101 mmol/L (ref 101–111)
GFR calc Af Amer: >60 mL/min (ref >60)
GLUCOSE: 135 mg/dL — AB (ref 65–99)
POTASSIUM: 4.2 mmol/L (ref 3.5–5.1)
SODIUM: 138 mmol/L (ref 135–145)

## 2015-07-02 LAB — PROCALCITONIN

## 2015-07-02 MED ORDER — INSULIN ASPART 100 UNIT/ML ~~LOC~~ SOLN
0.0000 [IU] | Freq: Three times a day (TID) | SUBCUTANEOUS | Status: DC
Start: 2015-07-02 — End: 2015-07-12
  Administered 2015-07-02: 1 [IU] via SUBCUTANEOUS
  Administered 2015-07-02 (×2): 2 [IU] via SUBCUTANEOUS
  Administered 2015-07-03: 1 [IU] via SUBCUTANEOUS
  Administered 2015-07-03 (×2): 2 [IU] via SUBCUTANEOUS
  Administered 2015-07-04 – 2015-07-05 (×4): 1 [IU] via SUBCUTANEOUS
  Administered 2015-07-05 – 2015-07-06 (×4): 2 [IU] via SUBCUTANEOUS
  Administered 2015-07-06: 3 [IU] via SUBCUTANEOUS
  Administered 2015-07-07 – 2015-07-08 (×6): 2 [IU] via SUBCUTANEOUS
  Administered 2015-07-09: 3 [IU] via SUBCUTANEOUS
  Administered 2015-07-09: 2 [IU] via SUBCUTANEOUS
  Administered 2015-07-09: 1 [IU] via SUBCUTANEOUS
  Administered 2015-07-10 – 2015-07-11 (×5): 2 [IU] via SUBCUTANEOUS
  Administered 2015-07-11: 1 [IU] via SUBCUTANEOUS
  Administered 2015-07-12 (×2): 2 [IU] via SUBCUTANEOUS

## 2015-07-02 MED ORDER — ASPIRIN 81 MG PO CHEW
81.0000 mg | CHEWABLE_TABLET | Freq: Every day | ORAL | Status: DC
  Administered 2015-07-03 – 2015-07-12 (×10): 81 mg
  Filled 2015-07-02 (×10): qty 1

## 2015-07-02 MED ORDER — LABETALOL HCL 5 MG/ML IV SOLN
10.0000 mg | INTRAVENOUS | Status: DC | PRN
  Administered 2015-07-06: 10 mg via INTRAVENOUS
  Filled 2015-07-02: qty 4

## 2015-07-02 MED ORDER — FREE WATER
100.0000 mL | Freq: Four times a day (QID) | Status: DC
  Administered 2015-07-02 – 2015-07-03 (×3): 100 mL

## 2015-07-02 MED ORDER — BISACODYL 10 MG RE SUPP
10.0000 mg | Freq: Every day | RECTAL | Status: AC
  Administered 2015-07-02 – 2015-07-04 (×3): 10 mg via RECTAL
  Filled 2015-07-02 (×3): qty 1

## 2015-07-02 MED ORDER — PREDNISONE 20 MG PO TABS
20.0000 mg | ORAL_TABLET | Freq: Every day | ORAL | Status: DC
  Administered 2015-07-03: 20 mg via ORAL
  Filled 2015-07-02: qty 1

## 2015-07-02 MED ORDER — FAMOTIDINE 40 MG/5ML PO SUSR
20.0000 mg | Freq: Two times a day (BID) | ORAL | Status: DC
  Administered 2015-07-02 – 2015-07-12 (×20): 20 mg
  Filled 2015-07-02 (×21): qty 2.5

## 2015-07-02 MED ORDER — AMLODIPINE BESYLATE 5 MG PO TABS
5.0000 mg | ORAL_TABLET | Freq: Every day | ORAL | Status: DC
  Administered 2015-07-03: 5 mg
  Filled 2015-07-02: qty 1

## 2015-07-02 MED ORDER — FUROSEMIDE 10 MG/ML IJ SOLN
40.0000 mg | Freq: Every day | INTRAMUSCULAR | Status: DC
  Administered 2015-07-02: 40 mg via INTRAVENOUS
  Filled 2015-07-02: qty 4

## 2015-07-02 MED ORDER — FENTANYL 25 MCG/HR TD PT72
50.0000 ug | MEDICATED_PATCH | TRANSDERMAL | Status: DC
  Administered 2015-07-03: 50 ug via TRANSDERMAL
  Filled 2015-07-02: qty 2

## 2015-07-02 NOTE — Progress Notes (Signed)
PROGRESS NOTE  Gregory Parker ZOX:[id number] DOB: 1955-05-17 DOA: 06/04/2015 PCP: Isabella Stalling, MD  HPI/Recap of past 24 hours:  alert, with trach, peg, not following command, chronic contracture,  Per RN, oral secretion is better,  Afebrile last 24hrs, no bm since 3/21, had bm on 3/27 after tap water enema  Assessment/Plan: Active Problems:   Acute respiratory failure (HCC)   Seizures (HCC)   Encounter for intubation   Status epilepticus (HCC)   Alcohol abuse   Malignant hypertension   Cerebral thrombosis with cerebral infarction   Coma (HCC)   Alcohol withdrawal (HCC)   Pneumonia   Acute encephalopathy   Stroke [facility])   Acute respiratory failure with hypoxemia (HCC)  Acute encephalopathy/alcohol withdrawal seizure/neuroleptic malignant syndrome/maligant hyperthermia/old left basal ganglia infarct,  Patient was intubated for airway protection on 2/27 in [facility] ED. Now s/p trach and peg, on tube feeds. ldl 63, a1c 5.8 s/p dantrolene from 3/2 to 3/5, with reported dramatic improvement with spasticity and ck level.  Patient was initially treated with keppra which was later discontinued on 3/3 due to no evidence of seizure with unremarkable EEG.   Acute respiratory failure with pneumococcus pneumonia, possible angioedema during hospitalization (possible from CHG), patient has finished abx course, failure to wean, now s/p trach and peg. Taper steroids, continue nebs. On scopolamine patch for secretion control, add glycopyrrolate, Trach care per pccm and respiratory. SNF requires that patient does not require suctioning more often  than every 4hrs   Fever; intermittent fever, last fever 102  on 3/24 3pm and 101.5 on 3/24 7pm. tmax 100.7 on 3/25-3/26, Repeat ua unremarkable,  Cxr+ infiltrate vs pulmonary,  pct continue to decrease, will give lasix, decrease free water flush, hopefully this will help airway secretion as well. No fever 3/26-3/27  HTN; was on on norvasc and ccb. On  asa. Has started lasix, novasc d/ced on 3/27 due to low normal bp,   rhabdomyolysis on admission: resolved.  Leukocytosis: steroids induced? Taper steroids. better  Patient was on fentanyl drip then On fentanyl patch, for pain control? Currently does not seem in pain, decrease fentanyl from 75 mcg q72hrs to q72hrs start from 3/28.  Constipation: no bm since 3/21, kub no obstruction, enema ordered. bmx1 on 3/27   Nutrition: tube feeds   Code Status: full  Family Communication: patient   Disposition Plan: snf eventually, barrier, frequent oral suction , constipation   Consultants:  Intubated at [facility] on 2/27 and admitted to [facility] ICU on 2/27, transfer to [facility] on 3/25, pulmonology continue to follow  Neurology  (signed off)  IR for peg placement  Procedures:  Intubation on 2/27  Bronchoscopy and trach on 3/10  Lumbar puncture on 3/1, negative  Peg placement on 3/21  EEG    Antibiotics: Vancomycin 3/1>>>3/2 Rocephin 3/1>>>3/2>>> 3/12 Amp 3/1>>>3/2 Acyclovir 3/1>>>3/2 Above for empiric coverage for meningitis, these was d/ed after negative lumbar puncture.  Completed course ceftriaxone 3/12 for pneumococcus  Repeated cx's 3/15, note 1 of 2 GPC blood cx. With dropping Pct will defer abx, follow   Objective: BP 109/67 mmHg  Pulse 69  Temp(Src) 99.1 F (37.3 C) (Axillary)  Resp 19  Ht  (1.651 m)  Wt 57 kg (125 lb 10.6 oz)  BMI 20.91 kg/m2  SpO2 97%  Intake/Output Summary (Last 24 hours) at 07/02/15 0831 Last data filed at 07/02/15 0600  Gross per 24 hour  Intake   1530 ml  Output   1800 ml  Net   -  270 ml   Filed Weights   06/30/15 0404 07/01/15 0430 07/02/15 0500  Weight: 57.2 kg (126 lb 1.7 oz) 57.4 kg (126 lb 8.7 oz) 57 kg (125 lb 10.6 oz)    Exam:   General:  S/p trach, peg, open eyes to voice, oral thrush better, tongue edema has resolved,  Cardiovascular: RRR, few pac's  Respiratory: + airway secretion need frequent  suction, +rhonchi  Abdomen: Soft/ND/NT, positive BS, s/p peg  Musculoskeletal: No Edema, orthotic boots on left foot to prevent foot drop  Neuro: open eyes, not following command, chronic contracture, right side contracture, moving left side.  Data Reviewed: Basic Metabolic Panel:  Recent Labs Lab 06/26/15 0204 06/30/15 0523 07/01/15 0335 07/02/15 0329  NA 139 137 137 138  K 5.5* 4.8 4.6 4.2  CL 103 103 103 101  CO2 GLUCOSE 147* 156* 222* 135*  BUN 27* 22* 24* 24*  CREATININE 0.75 0.67 0.60* 0.72  CALCIUM 9.3 9.1 9.0 9.1  MG 2.0 1.8  --  1.8  PHOS 5.0* 3.1  --   --    Liver Function Tests:  Recent Labs Lab 07/01/15 0335 07/01/15 0919  AST 56* 47*  ALT 98* 88*  ALKPHOS 73 74  BILITOT 0.3 0.3  PROT 6.8 6.4*  ALBUMIN 2.4* 2.3*   No results for input(s): LIPASE, AMYLASE in the last 168 hours. No results for input(s): AMMONIA in the last 168 hours. CBC:  Recent Labs Lab 06/26/15 0204 06/30/15 0523 07/01/15 0919  WBC 17.6* 29.1* 19.9*  HGB 8.9* 9.4* 9.3*  HCT 28.5* 29.4* 29.2*  MCV 91.1 92.2 92.7  PLT 662* 479* 416*   Cardiac Enzymes:   No results for input(s): CKTOTAL, CKMB, CKMBINDEX, TROPONINI in the last 168 hours. BNP (last 3 results) No results for input(s): BNP in the last 8760 hours.  ProBNP (last 3 results) No results for input(s): PROBNP in the last 8760 hours.  CBG:  Recent Labs Lab 07/01/15 1212 07/01/15 1555 07/01/15 2057 07/02/15 0043 07/02/15 0457  GLUCAP 97 167* 166* 137* 129*    Recent Results (from the past 240 hour(s))  Culture, respiratory (NON-Expectorated)     Status: None   Collection Time: 06/23/15  3:33 PM  Result Value Ref Range Status   Specimen Description TRACHEAL ASPIRATE  Final   Special Requests Normal  Final   Gram Stain   Final    ABUNDANT WCBP FEW SQUAMOUS EPITHELIAL CELLS PRESENT MODERATE GRAM NEGATIVE RODS RARE GRAM POSITIVE COCCI IN PAIRS THIS SPECIMEN IS ACCEPTABLE FOR SPUTUM  CULTURE Performed at [facility]    Culture   Final    NORMAL OROPHARYNGEAL FLORA Performed at [facility]    Report Status 06/26/2015 FINAL  Final     Studies: Dg Abd Portable 1v  07/01/2015  CLINICAL DATA:  Abdominal distension. No bowel movement since 06/26/2015 EXAM: PORTABLE ABDOMEN - 1 VIEW COMPARISON:  None. FINDINGS: Contrast material is noted throughout the colon as well as fecal material consistent with a degree of constipation. A gastrostomy catheter is noted in place. No free air is seen. No other focal abnormality is noted. IMPRESSION: Contrast fecal material throughout the colon consistent with a degree of constipation. No acute obstructive changes are noted. Electronically Signed   By: Alcide Clever M.D.   On: 07/01/2015 16:30   US Abdomen Limited Ruq  07/01/2015  CLINICAL DATA:  LFT elevation.  Alcohol abuse. EXAM: US ABDOMEN LIMITED - RIGHT UPPER QUADRANT COMPARISON:  None. FINDINGS: Gallbladder: No gallstones or wall thickening visualized. No sonographic Murphy sign noted by sonographer. Common bile duct: Diameter: Upper limits normal 6.5 mm. Liver: No focal lesion identified. Within normal limits in parenchymal echogenicity. IMPRESSION: No evidence for gallstones or biliary obstruction. No focal hepatic abnormality is detected. Electronically Signed   By: Elsie StainJohn T Curnes M.D.   On: 07/01/2015 14:01    Scheduled Meds: . amLODipine  5 mg Per Tube Daily  . aspirin  81 mg Oral Daily  . bisacodyl  10 mg Rectal Daily  . diltiazem  60 mg Per Tube 4 times per day  . famotidine (PEPCID) IV  20 mg Intravenous Q24H  . fentaNYL  75 mcg Transdermal Q72H  . free water  200 mL Per Tube 4 times per day  . furosemide  40 mg Intravenous Daily  . glycopyrrolate  1 mg Per Tube BID  . guaiFENesin  600 mg Oral BID  . heparin  5,000 Units Subcutaneous 3 times per day  . insulin aspart  0-9 Units Subcutaneous TID WC  . levalbuterol  0.63 mg Nebulization Q6H  . multivitamin  with minerals  1 tablet Per Tube Daily  . nystatin  5 mL Oral QID  . polyethylene glycol  17 g Oral Daily  . predniSONE  30 mg Oral Q breakfast  . scopolamine  1 patch Transdermal Q72H  . senna-docusate  2 tablet Per Tube BID    Continuous Infusions: . feeding supplement (VITAL AF 1.2 CAL) 1,000 mL (07/01/15 2205)     Time spent: 35mins  Jalin Erpelding MD, PhD  [facility] Pager [phone]. If 7PM-7AM, please contact night-coverage at [email or web address], password [email or web address] 07/02/2015, 8:31 AM  LOS: 28 days

## 2015-07-02 NOTE — Progress Notes (Signed)
CSW continues to follow pt for SNF placement when pt is appropriate.  SNF requires that pt does not require suctioning more often than every 4 hours- currently pt high level of suctioning needs are barrier to placement  Merlyn LotJenna Holoman, Dim[facility] County Memorial HospitalCSWA Clinical Social Worker [phone]

## 2015-07-02 NOTE — Progress Notes (Signed)
PULMONARY / CRITICAL CARE MEDICINE   Name: Gregory Parker MRN: [id number] DOB: [birth year]    ADMISSION DATE:  06/04/2015 CONSULTATION DATE:  06/04/2015  [facility]  CHIEF COMPLAINT:  AMS  SUBJECTIVE:  Tolerating TC on 28%. Lots of purulent trache secretions.   VITAL SIGNS: BP 164/103 mmHg  Pulse 104  Temp(Src) 98.7 F (37.1 C) (Oral)  Resp 22  Ht 5\' 5"  (1.651 m)  Wt 125 lb 10.6 oz (57 kg)  BMI 20.91 kg/m2  SpO2 97%  VENTILATOR SETTINGS: Vent Mode:  [-]  FiO2 (%):  [28 %] 28 %  INTAKE / OUTPUT: I/O last 3 completed shifts: In: 2740 [I.V.:180; NG/GT:2460; IV Piggyback:100] Out: 3375 [Urine:3375]  PHYSICAL EXAMINATION: General: ill-appearing, on TC Neuro: opens eyes, doesn't follow commands or do anything purposeful HEENT: massive tongue swelling looks stable 3/16, improving, trach site clean Cardiac: regular Chest: b/l rhonchi Abd: soft, non tender Ext: 1+ edema Skin: no rashes  BMET  Recent Labs Lab 06/30/15 0523 07/01/15 0335 07/02/15 0329  NA 137 137 138  K 4.8 4.6 4.2  CL 103 103 101  CO2 25 23 25   BUN 22* 24* 24*  CREATININE 0.67 0.60* 0.72  GLUCOSE 156* 222* 135*   Electrolytes  Recent Labs Lab 06/26/15 0204 06/30/15 0523 07/01/15 0335 07/02/15 0329  CALCIUM 9.3 9.1 9.0 9.1  MG 2.0 1.8  --  1.8  PHOS 5.0* 3.1  --   --    CBC  Recent Labs Lab 06/26/15 0204 06/30/15 0523 07/01/15 0919  WBC 17.6* 29.1* 19.9*  HGB 8.9* 9.4* 9.3*  HCT 28.5* 29.4* 29.2*  PLT 662* 479* 416*   Coag's No results for input(s): APTT, INR in the last 168 hours. Sepsis Markers  Recent Labs Lab 06/30/15 1439 07/02/15 0329  PROCALCITON 0.16 <0.10   ABG No results for input(s): PHART, PCO2ART, PO2ART in the last 168 hours. Liver Enzymes  Recent Labs Lab 07/01/15 0335 07/01/15 0919  AST 56* 47*  ALT 98* 88*  ALKPHOS 73 74  BILITOT 0.3 0.3  ALBUMIN 2.4* 2.3*   Cardiac Enzymes No results for input(s): TROPONINI, PROBNP in  the last 168 hours. Glucose  Recent Labs Lab 07/01/15 1555 07/01/15 2057 07/02/15 0043 07/02/15 0457 07/02/15 0839 07/02/15 1123  GLUCAP 167* 166* 137* 129* 183* 135*   Imaging Dg Abd Portable 1v  07/01/2015  CLINICAL DATA:  Abdominal distension. No bowel movement since 06/26/2015 EXAM: PORTABLE ABDOMEN - 1 VIEW COMPARISON:  None. FINDINGS: Contrast material is noted throughout the colon as well as fecal material consistent with a degree of constipation. A gastrostomy catheter is noted in place. No free air is seen. No other focal abnormality is noted. IMPRESSION: Contrast fecal material throughout the colon consistent with a degree of constipation. No acute obstructive changes are noted. Electronically Signed   By: Alcide CleverMark  Lukens M.D.   On: 07/01/2015 16:30   [place] Abdomen Limited Ruq  07/01/2015  CLINICAL DATA:  LFT elevation.  Alcohol abuse. EXAM: US ABDOMEN LIMITED - RIGHT UPPER QUADRANT COMPARISON:  None. FINDINGS: Gallbladder: No gallstones or wall thickening visualized. No sonographic Murphy sign noted by sonographer. Common bile duct: Diameter: Upper limits normal 6.5 mm. Liver: No focal lesion identified. Within normal limits in parenchymal echogenicity. IMPRESSION: No evidence for gallstones or biliary obstruction. No focal hepatic abnormality is detected. Electronically Signed   By: Elsie StainJohn T Curnes M.D.   On: 07/01/2015 14:01     CULTURES: BCx2 2/27 , 2/28 >ng  3/2 resp >>> strep PNA (sens) 3/8 bld >> negative 3/8 resp >> nml flora\ 3/15 resp >> normal flora 3/15 blood >> 1 of 2 GPC >>  3/15 urine >> negative  LINES/TUBES: ETT 2/27>>> 3/10 Trach 3/10 >>  SIGNIFICANT EVENTS: 2/27 AMS, ? Seizure, intubated in [facility], tx to [facility] ICU 3/05 cardene gtt 3/05 dantrolene stopped 3/10 change to pressure control  STUDIES:  CT head 2/27 >  No acute intracranial abnormality. Remote bilateral basal ganglia lacunar infarcts. Apparent soft tissue fullness in the nasopharynx could relate  to retained secretions, especially given absence of mastoid effusions.  MRI brain 3/3>>>acute left basal ganglia lacunar EEG 3/6 nml  DISCUSSION: 60 yo male presented to [facility] with altered mental status.  He has hx of ETOH and CVA, and reported to smoke crack prior to admission.  Concern was he had alcohol withdrawal seizure.  Intubated for airway protection. Now w trach     ASSESSMENT / PLAN:  NEUROLOGIC A:   Acute encephalopathy. 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 P:   Monitor mental status Thiamine, folic acid completed On fentanyl patch  PULMONARY A: Acute respiratory failure in setting of seizures an HCAP. Failure to wean s/p trach, beginning some ATC on 3/13 Apparent angioedema, tongue enlargement With inc trache secretions P:   ATC as tolerated. Increased secretion with frequent section. F/u CXR intermittently. Solumedrol and pepcid given, completed. Diureses.  CARDIOVASCULAR A:  HTN. P:  Continue cardizem per tube ASA 81 mg per tube started 3/20, if swelling worsens then will stop and depend on another anti-plat agent.  RENAL A:  Hypernatremia >>  better Rhabdomyolysis on admission >> resolved. P:   BMET in AM. Replace electrolytes as indicated. D/c free water?   GASTROINTESTINAL A:   Protein calorie malnutrition. P:   Continue tube feeds G-tube placed by IR 3/21. Protonix for SUP  HEMATOLOGIC A:   Anemia of chronic disease and critical illness. Leukocytosis  P:  F/u CBC SQ heparin  INFECTIOUS A:   CAP with pneumococcus in sputum. GPC on blood cx 1 of 2, ? contaminant P:   LP 3/1>>>negative Vancomycin 3/1>>>3/2 Rocephin 3/1>>>3/2>>> 3/12 Amp 3/1>>>3/2 Acyclovir 3/1>>>3/2  Completed course ceftriaxone 3/12 for pneumococcus  Repeated cx's 3/15, note 1 of 2 GPC blood cx. With dropping Pct will defer abx, follow   ENDOCRINE A:   No acute issues.   P:   Monitor blood sugar, has not required  coverage  Disposition >> plan fro LTAC/SNF  J. Alexis Frock, MD 07/02/2015, 12:05 PM [facility] Pager [phone] After 3 pm or if no answer, call [phone]

## 2015-07-03 ENCOUNTER — Inpatient Hospital Stay (HOSPITAL_COMMUNITY): Payer: Medicare Other

## 2015-07-03 LAB — HEPATIC FUNCTION PANEL
ALK PHOS: 98 U/L (ref 38–126)
ALT: 110 U/L — AB (ref 17–63)
AST: 66 U/L — ABNORMAL HIGH (ref 15–41)
Albumin: 2.9 g/dL — ABNORMAL LOW (ref 3.5–5.0)
BILIRUBIN INDIRECT: 0.5 mg/dL (ref 0.3–0.9)
Bilirubin, Direct: 0.1 mg/dL (ref 0.1–0.5)
TOTAL PROTEIN: 7.7 g/dL (ref 6.5–8.1)
Total Bilirubin: 0.6 mg/dL (ref 0.3–1.2)

## 2015-07-03 LAB — URINE MICROSCOPIC-ADD ON

## 2015-07-03 LAB — URINALYSIS, ROUTINE W REFLEX MICROSCOPIC
Bilirubin Urine: NEGATIVE
GLUCOSE, UA: NEGATIVE mg/dL
HGB URINE DIPSTICK: NEGATIVE
Ketones, ur: NEGATIVE mg/dL
Leukocytes, UA: NEGATIVE
Nitrite: NEGATIVE
Protein, ur: NEGATIVE mg/dL
SPECIFIC GRAVITY, URINE: 1.023 (ref 1.005–1.030)
pH: 8.5 — ABNORMAL HIGH (ref 5.0–8.0)

## 2015-07-03 LAB — GLUCOSE, CAPILLARY
GLUCOSE-CAPILLARY: 166 mg/dL — AB (ref 65–99)
Glucose-Capillary: 122 mg/dL — ABNORMAL HIGH (ref 65–99)
Glucose-Capillary: 141 mg/dL — ABNORMAL HIGH (ref 65–99)
Glucose-Capillary: 167 mg/dL — ABNORMAL HIGH (ref 65–99)
Glucose-Capillary: 143 mg/dL — ABNORMAL HIGH (ref 65–99)
Glucose-Capillary: 114 mg/dL — ABNORMAL HIGH (ref 65–99)

## 2015-07-03 LAB — CBC
HCT: 34.4 % — ABNORMAL LOW (ref 39.0–52.0)
HEMOGLOBIN: 11.1 g/dL — AB (ref 13.0–17.0)
MCH: 29.9 pg (ref 26.0–34.0)
MCHC: 32.3 g/dL (ref 30.0–36.0)
MCV: 92.7 fL (ref 78.0–100.0)
Platelets: 467 10*3/uL — ABNORMAL HIGH (ref 150–400)
RBC: 3.71 MIL/uL — AB (ref 4.22–5.81)
RDW: 16.1 % — ABNORMAL HIGH (ref 11.5–15.5)
WBC: 20.3 10*3/uL — ABNORMAL HIGH (ref 4.0–10.5)

## 2015-07-03 LAB — HEPATITIS PANEL, ACUTE
HCV Ab: >11 s/co ratio — ABNORMAL HIGH (ref 0.0–0.9)
HEP B C IGM: NEGATIVE
HEP B S AG: NEGATIVE
Hep A IgM: NEGATIVE

## 2015-07-03 LAB — BASIC METABOLIC PANEL
ANION GAP: 11 (ref 5–15)
BUN: 28 mg/dL — ABNORMAL HIGH (ref 6–20)
CALCIUM: 9.8 mg/dL (ref 8.9–10.3)
CO2: 26 mmol/L (ref 22–32)
Chloride: 99 mmol/L — ABNORMAL LOW (ref 101–111)
Creatinine, Ser: 0.63 mg/dL (ref 0.61–1.24)
GFR calc non Af Amer: >60 mL/min (ref >60)
Glucose, Bld: 134 mg/dL — ABNORMAL HIGH (ref 65–99)
POTASSIUM: 4.3 mmol/L (ref 3.5–5.1)
SODIUM: 136 mmol/L (ref 135–145)

## 2015-07-03 MED ORDER — FREE WATER
50.0000 mL | Freq: Four times a day (QID) | Status: DC
  Administered 2015-07-03 – 2015-07-09 (×25): 50 mL

## 2015-07-03 MED ORDER — FUROSEMIDE 10 MG/ML IJ SOLN
40.0000 mg | Freq: Two times a day (BID) | INTRAMUSCULAR | Status: DC
  Administered 2015-07-03 – 2015-07-05 (×4): 40 mg via INTRAVENOUS
  Filled 2015-07-03 (×4): qty 4

## 2015-07-03 MED ORDER — PREDNISONE 10 MG PO TABS
10.0000 mg | ORAL_TABLET | Freq: Every day | ORAL | Status: DC
  Administered 2015-07-05 – 2015-07-06 (×2): 10 mg via ORAL
  Filled 2015-07-03 (×3): qty 1

## 2015-07-03 NOTE — Progress Notes (Signed)
PULMONARY / CRITICAL CARE MEDICINE   Name: Gregory Parker MRN: [id number] DOB: 14-Nov-1955    ADMISSION DATE:  06/04/2015 CONSULTATION DATE:  06/04/2015  REFERRING MD:  [facility]  CHIEF COMPLAINT:  AMS  SUBJECTIVE:  NAD on t collar  VITAL SIGNS: BP 132/80 mmHg  Pulse 105  Temp(Src) 100.2 F (37.9 C) (Axillary)  Resp 24  Ht  (1.651 m)  Wt 126 lb 1.7 oz (57.2 kg)  BMI 20.98 kg/m2  SpO2 97%  VENTILATOR SETTINGS: Vent Mode:  [-]  FiO2 (%):  [28 %] 28 %  INTAKE / OUTPUT: I/O last 3 completed shifts: In: 3834.5 [I.V.:380; NG/GT:3454.5] Out: 2930 [Urine:2930]  PHYSICAL EXAMINATION: General: ill-appearing, on TC Neuro: opens eyes, doesn't follow commands or do anything purposeful HEENT: massive tongue swelling looks stable 3/16, improving, trach site clean Cardiac: regular Chest: b/l rhonchi, decreased in bases Abd: soft, non tender Ext: 1+ edema Skin: no rashes  BMET  Recent Labs Lab 07/01/15 0335 07/02/15 0329 07/03/15 0513  NA 137 138 136  K 4.6 4.2 4.3  CL 103 101 99*  CO2 BUN 24* 24* 28*  CREATININE 0.60* 0.72 0.63  GLUCOSE 222* 135* 134*   Electrolytes  Recent Labs Lab 06/30/15 0523 07/01/15 0335 07/02/15 0329 07/03/15 0513  CALCIUM 9.1 9.0 9.1 9.8  MG 1.8  --  1.8  --   PHOS 3.1  --   --   --    CBC  Recent Labs Lab 06/30/15 0523 07/01/15 0919 07/03/15 0513  WBC 29.1* 19.9* 20.3*  HGB 9.4* 9.3* 11.1*  HCT 29.4* 29.2* 34.4*  PLT 479* 416* 467*   Coag's No results for input(s): APTT, INR in the last 168 hours. Sepsis Markers  Recent Labs Lab 06/30/15 1439 07/02/15 0329  PROCALCITON 0.16 <0.10   ABG No results for input(s): PHART, PCO2ART, PO2ART in the last 168 hours. Liver Enzymes  Recent Labs Lab 07/01/15 0335 07/01/15 0919 07/03/15 0513  AST 56* 47* 66*  ALT 98* 88* 110*  ALKPHOS 73 74 98  BILITOT 0.3 0.3 0.6  ALBUMIN 2.4* 2.3* 2.9*   Cardiac Enzymes No results for input(s): TROPONINI,  PROBNP in the last 168 hours. Glucose  Recent Labs Lab 07/02/15 1123 07/02/15 1623 07/02/15 2030 07/03/15 0138 07/03/15 0546 07/03/15 0918  GLUCAP 135* 173* 107* 122* 141* 166*   Imaging No results found.   CULTURES: BCx2 2/27 , 2/28 >ng 3/2 resp >>> strep PNA (sens) 3/8 bld >> negative 3/8 resp >> nml flora\ 3/15 resp >> normal flora 3/15 blood >> 1 of 2 GPC >>  3/15 urine >> negative  LINES/TUBES: ETT 2/27>>> 3/10 Trach 3/10 >>  SIGNIFICANT EVENTS: 2/27 AMS, ? Seizure, intubated in [facility] ED, tx to [facility] ICU 3/05 cardene gtt 3/05 dantrolene stopped 3/10 change to pressure control  STUDIES:  CT head 2/27 >  No acute intracranial abnormality. Remote bilateral basal ganglia lacunar infarcts. Apparent soft tissue fullness in the nasopharynx could relate to retained secretions, especially given absence of mastoid effusions.  MRI brain 3/3>>>acute left basal ganglia lacunar EEG 3/6 nml  DISCUSSION: 60 yo male presented to [facility] with altered mental status.  He has hx of ETOH and CVA, and reported to smoke crack prior to admission.  Concern was he had alcohol withdrawal seizure.  Intubated for airway protection. Now w trach     ASSESSMENT / PLAN:  NEUROLOGIC A:   Acute encephalopathy. 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 P:  Monitor mental status Thiamine, folic acid completed On fentanyl patch  PULMONARY A: Acute respiratory failure in setting of seizures an HCAP. Failure to wean s/p trach, beginning some ATC on 3/13 Apparent angioedema, tongue enlargement With inc trache secretions P:   ATC as tolerated. Increased secretion with frequent section. F/u CXR intermittently. Solumedrol and pepcid given, completed. Diureses.  CARDIOVASCULAR A:  HTN. P:  Continue cardizem per tube ASA 81 mg per tube started 3/20, if swelling worsens then will stop and depend on another anti-plat agent.  RENAL  Recent Labs Lab 07/01/15 0335  07/02/15 0329 07/03/15 0513  NA 137 138 136    A:  Hypernatremia >>  resolved Rhabdomyolysis on admission >> resolved. P:   BMET per Primary team Replace electrolytes as indicated.   GASTROINTESTINAL A:   Protein calorie malnutrition. P:   Continue tube feeds G-tube placed by IR 3/21. Protonix for SUP  HEMATOLOGIC  Recent Labs  07/01/15 0919 07/03/15 0513  HGB 9.3* 11.1*    A:   Anemia of chronic disease and critical illness. Leukocytosis  P:  F/u CBC SQ heparin  INFECTIOUS A:   CAP with pneumococcus in sputum. GPC on blood cx 1 of 2, ? contaminant P:   LP 3/1>>>negative Vancomycin 3/1>>>3/2 Rocephin 3/1>>>3/2>>> 3/12 Amp 3/1>>>3/2 Acyclovir 3/1>>>3/2  Completed course ceftriaxone 3/12 for pneumococcus  Repeated cx's 3/15, note 1 of 2 GPC blood cx. With dropping Pct will defer abx, follow   ENDOCRINE A:   No acute issues.   P:   Monitor blood sugar, has not required coverage  Disposition >> plan fro LTAC/SNF  Brett CanalesSteve Vicke Plotner ACNP [facility] P[facility] Pager [phone] till 3 pm If no answer page [phone] 07/03/2015, 11:53 AM

## 2015-07-03 NOTE — Progress Notes (Signed)
Nutrition Follow-up  DOCUMENTATION CODES:   Severe malnutrition in context of chronic illness  INTERVENTION:    Continue Vital AF 1.2 formula goal rate of 70 ml/hr via G-tube to provide 2016 kcals, 126 gm protein, 1362 ml of free water  NUTRITION DIAGNOSIS:   Malnutrition related to chronic illness as evidenced by severe depletion of body fat, severe depletion of muscle mass, ongoing  GOAL:   Patient will meet greater than or equal to 90% of their needs, met  MONITOR:   TF tolerance, I & O's, Weight trends  ASSESSMENT:   Pt with etoh and drug abuse history presenting with seizures and acute CVA.   Patient s/p procedure 3/21: IR GASTROSTOMY TUBE MOD SED   Patient currently on trach collar.  Trach changed 3/23.  + purulent secretions.  Vital AF 1.2 formula continues to infuse at 70 ml/hr providing 2016 kcals, 126 gm protein, 1362 ml of free water.  Per RN, pt had TF residual of 20 ml.  RD advised RN to continue with TF infusion.    Adult Tube Feeding Protocol in place (notify MD if abdominal pain, distention, vomiting or suspected aspiration).  Disposition: trach SNF placement.  Diet Order:  Diet NPO time specified Except for: Sips with Meds  Skin:  Reviewed, no issues  Last BM:  3/27  Height:   Ht Readings from Last 1 Encounters:  06/23/15 _0  (1.651 m)    Weight:   Wt Readings from Last 1 Encounters:  07/03/15 126 lb 1.7 oz (57.2 kg)    Ideal Body Weight:  59 kg  BMI:  Body mass index is 20.98 kg/(m^2).  Estimated Nutritional Needs:   Kcal:  1900-2100  Protein:  95-120 grams  Fluid:  > 1.6 L/day  EDUCATION NEEDS:   No education needs identified at this time  Arthur Holms, RD, LDN Pager #: [phone] After-Hours Pager #: [phone]

## 2015-07-03 NOTE — Plan of Care (Signed)
Problem: Nutrition: Goal: Risk of aspiration will decrease Outcome: Progressing Residuals of peg tube feeding less than 10 ml even though secretions from trach smell like feeding tube.

## 2015-07-03 NOTE — Progress Notes (Addendum)
PROGRESS NOTE  Gregory Parker WUJ:[id number] DOB: 07/13/1955 DOA: 06/04/2015 PCP: Isabella StallingNDIEGO,RICHARD M, MD  Brief summary:  Intubated at [facility] and [facility], PCCM pick up on 3/25, now trached and packed, still significant airway secretion, ? Fever, LTAC vs snf placement  HPI/Recap of past 24 hours:  alert, with trach, peg, not following command, chronic contracture right upper extremity  Still significant airway secretion Afebrile last 24hrs, no bm since 3/21, had bm on 3/27 after tap water enema  Assessment/Plan: Active Problems:   Acute respiratory failure (HCC)   Seizures (HCC)   Encounter for intubation   Status epilepticus (HCC)   Alcohol abuse   Malignant hypertension   Cerebral thrombosis with cerebral infarction   Coma (HCC)   Alcohol withdrawal (HCC)   Pneumonia   Acute encephalopathy   Stroke (HCC)   Acute respiratory failure with hypoxemia (HCC)   Constipation   Protein calorie malnutrition (HCC)   Tracheostomy care (HCC)   PEG (percutaneous endoscopic gastrostomy) status (HCC)   Acute encephalopathy/alcohol withdrawal seizure/neuroleptic malignant syndrome/maligant hyperthermia/old left basal ganglia infarct,  Patient was intubated for airway protection on 2/27 in [facility]. Now s/p trach and peg, on tube feeds. ldl 63, a1c 5.8 s/p dantrolene from 3/2 to 3/5, with reported dramatic improvement with spasticity and ck level.  Patient was initially treated with keppra which was later discontinued on 3/3 due to no evidence of seizure with unremarkable EEG.   Acute respiratory failure with pneumococcus pneumonia, possible angioedema during hospitalization (possible from CHG), patient has finished abx course, failure to wean, now s/p trach and peg.  Taper steroids, continue nebs.  On scopolamine patch for secretion control, add glycopyrrolate, decrease free water flush, increase lasix to 40mg  bid, Trach care, ariway secretion per pccm and  respiratory. SNF requires that patient does not require suctioning more often  than every 4hrs   Fever;   last fever 100.7 on 3/25,  Repeat ua unremarkable,  Cxr+ infiltrate vs pulmonary edema,  pct continue to decrease, persistent leukocytosis ( but also on steroids)  will give lasix, decrease free water flush, hopefully this will help airway secretion as well. If spike fever will add abx  HTN; was on on norvasc and ccb. On asa. Has started lasix, norvasc d/ced on 3/27 due to low normal bp, restarted norvasc on 3/28  rhabdomyolysis on admission: resolved.  Leukocytosis: steroids induced? Taper steroids. Add abx if spike  fever  Patient was on fentanyl drip then On fentanyl patch, for pain control? Currently does not seem in pain, decrease fentanyl from 75 mcg q72hrs to 50mcg q72hrs start from 3/28.   lft elevation: + hep c, negative hiv  Constipation: no bm since 3/21, kub no obstruction, enema ordered. bmx1 on 3/27   Nutrition: tube feeds   Code Status: full  Family Communication: patient   Disposition Plan:  LTAC vs snf eventually, barrier, frequent oral suction , fever?   Consultants:  Intubated at [facility] on 2[facility]27 and [facility], pulmonology continue to follow  Neurology  (signed off)  IR for peg placement  Procedures:  Intubation on 2/27  Bronchoscopy and trach on 3/10  Lumbar puncture on 3/1, negative  Peg placement on 3/21  EEG    Antibiotics: Vancomycin 3/1>>>3/2 Rocephin 3/1>>>3/2>>> 3/12 Amp 3/1>>>3/2 Acyclovir 3/1>>>3/2 Above for empiric coverage for meningitis, these was d/ed after negative lumbar puncture.  Completed course ceftriaxone 3/12 for  pneumococcus  Repeated cx's 3/15, note 1 of 2 GPC blood cx. With dropping Pct will defer abx, follow   Objective: BP 132/80 mmHg  Pulse 95  Temp(Src) 99.6 F (37.6 C) (Axillary)  Resp 20  Ht  (1.651 m)  Wt 57.2 kg (126 lb 1.7 oz)  BMI  20.98 kg/m2  SpO2 97%  Intake/Output Summary (Last 24 hours) at 07/03/15 0834 Last data filed at 07/03/15 0600  Gross per 24 hour  Intake 2624.5 ml  Output   2030 ml  Net  594.5 ml   Filed Weights   07/01/15 0430 07/02/15 0500 07/03/15 0500  Weight: 57.4 kg (126 lb 8.7 oz) 57 kg (125 lb 10.6 oz) 57.2 kg (126 lb 1.7 oz)    Exam:   General:  S/p trach, peg, open eyes to voice, oral thrush better, tongue edema has resolved,  Cardiovascular: RRR, few pac's  Respiratory: + airway secretion need frequent suction, +rhonchi  Abdomen: Soft/ND/NT, positive BS, s/p peg  Musculoskeletal: No Edema, orthotic boots on left foot to prevent foot drop  Neuro: open eyes, not following command, chronic contracture, right side contracture, moving left side.  Data Reviewed: Basic Metabolic Panel:  Recent Labs Lab 06/30/15 0523 07/01/15 0335 07/02/15 0329 07/03/15 0513  NA 137 137 138 136  K 4.8 4.6 4.2 4.3  CL 103 103 101 99*  CO2 GLUCOSE 156* 222* 135* 134*  BUN 22* 24* 24* 28*  CREATININE 0.67 0.60* 0.72 0.63  CALCIUM 9.1 9.0 9.1 9.8  MG 1.8  --  1.8  --   PHOS 3.1  --   --   --    Liver Function Tests:  Recent Labs Lab 07/01/15 0335 07/01/15 0919 07/03/15 0513  AST 56* 47* 66*  ALT 98* 88* 110*  ALKPHOS 73 74 98  BILITOT 0.3 0.3 0.6  PROT 6.8 6.4* 7.7  ALBUMIN 2.4* 2.3* 2.9*   No results for input(s): LIPASE, AMYLASE in the last 168 hours. No results for input(s): AMMONIA in the last 168 hours. CBC:  Recent Labs Lab 06/30/15 0523 07/01/15 0919 07/03/15 0513  WBC 29.1* 19.9* 20.3*  HGB 9.4* 9.3* 11.1*  HCT 29.4* 29.2* 34.4*  MCV 92.2 92.7 92.7  PLT 479* 416* 467*   Cardiac Enzymes:   No results for input(s): CKTOTAL, CKMB, CKMBINDEX, TROPONINI in the last 168 hours. BNP (last 3 results) No results for input(s): BNP in the last 8760 hours.  ProBNP (last 3 results) No results for input(s): PROBNP in the last 8760 hours.  CBG:  Recent  Labs Lab 07/02/15 1123 07/02/15 1623 07/02/15 2030 07/03/15 0138 07/03/15 0546  GLUCAP 135* 173* 107* 122* 141*    Recent Results (from the past 240 hour(s))  Culture, respiratory (NON-Expectorated)     Status: None   Collection Time: 06/23/15  3:33 PM  Result Value Ref Range Status   Specimen Description TRACHEAL ASPIRATE  Final   Special Requests Normal  Final   Gram Stain   Final    ABUNDANT WCBP FEW SQUAMOUS EPITHELIAL CELLS PRESENT MODERATE GRAM NEGATIVE RODS RARE GRAM POSITIVE COCCI IN PAIRS THIS SPECIMEN IS ACCEPTABLE FOR SPUTUM CULTURE Performed at [facility]    Culture   Final    NORMAL OROPHARYNGEAL FLORA Performed at [facility]    Report Status 06/26/2015 FINAL  Final     Studies: No results found.  Scheduled Meds: . amLODipine  5 mg Per Tube Daily  . aspirin  81 mg Per Tube Daily  . bisacodyl  10 mg Rectal Daily  . diltiazem  60 mg Per Tube 4 times per day  . famotidine  20 mg Per Tube BID  . fentaNYL  50 mcg Transdermal Q72H  . free water  100 mL Per Tube 4 times per day  . furosemide  40 mg Intravenous Daily  . guaiFENesin  600 mg Oral BID  . heparin  5,000 Units Subcutaneous 3 times per day  . insulin aspart  0-9 Units Subcutaneous TID WC  . levalbuterol  0.63 mg Nebulization Q6H  . nystatin  5 mL Oral QID  . polyethylene glycol  17 g Oral Daily  . [START ON 07/04/2015] predniSONE  10 mg Oral Q breakfast  . scopolamine  1 patch Transdermal Q72H  . senna-docusate  2 tablet Per Tube BID    Continuous Infusions: . feeding supplement (VITAL AF 1.2 CAL) 1,000 mL (07/02/15 1841)     Time spent:  Martia Dalby MD, PhD  [facility] Pager [phone]. If 7PM-7AM, please contact night-coverage at [email or web address], password [facility] 07/03/2015, 8:34 AM  LOS: 29 days

## 2015-07-04 ENCOUNTER — Inpatient Hospital Stay (HOSPITAL_COMMUNITY): Payer: Medicare Other

## 2015-07-04 DIAGNOSIS — Z430 Encounter for attention to tracheostomy: Secondary | ICD-10-CM

## 2015-07-04 DIAGNOSIS — R1310 Dysphagia, unspecified: Secondary | ICD-10-CM | POA: Diagnosis present

## 2015-07-04 LAB — GLUCOSE, CAPILLARY
GLUCOSE-CAPILLARY: 123 mg/dL — AB (ref 65–99)
GLUCOSE-CAPILLARY: 149 mg/dL — AB (ref 65–99)
GLUCOSE-CAPILLARY: 87 mg/dL (ref 65–99)
Glucose-Capillary: 124 mg/dL — ABNORMAL HIGH (ref 65–99)
Glucose-Capillary: 127 mg/dL — ABNORMAL HIGH (ref 65–99)
Glucose-Capillary: 138 mg/dL — ABNORMAL HIGH (ref 65–99)

## 2015-07-04 LAB — BASIC METABOLIC PANEL
Anion gap: 10 (ref 5–15)
BUN: 29 mg/dL — ABNORMAL HIGH (ref 6–20)
CHLORIDE: 99 mmol/L — AB (ref 101–111)
CO2: 27 mmol/L (ref 22–32)
CREATININE: 0.7 mg/dL (ref 0.61–1.24)
Calcium: 9.8 mg/dL (ref 8.9–10.3)
GFR calc non Af Amer: >60 mL/min (ref >60)
GLUCOSE: 127 mg/dL — AB (ref 65–99)
Potassium: 4.1 mmol/L (ref 3.5–5.1)
Sodium: 136 mmol/L (ref 135–145)

## 2015-07-04 LAB — PROCALCITONIN: Procalcitonin: <0.1 ng/mL

## 2015-07-04 MED ORDER — LEVOFLOXACIN IN D5W 750 MG/150ML IV SOLN
750.0000 mg | INTRAVENOUS | Status: DC
  Administered 2015-07-04: 750 mg via INTRAVENOUS
  Filled 2015-07-04: qty 150

## 2015-07-04 MED ORDER — VITAL 1.5 CAL PO LIQD
1000.0000 mL | ORAL | Status: DC
  Administered 2015-07-05 – 2015-07-12 (×6): 1000 mL
  Filled 2015-07-04 (×14): qty 1000

## 2015-07-04 NOTE — Progress Notes (Signed)
Turned tube feeding off during night d/t concern with aspiration having copious secretions.  Restarted tube feeding at 0600 with copious secretions resuming after start of tube feeding

## 2015-07-04 NOTE — Progress Notes (Addendum)
Nutrition Brief Note  RD spoke with Dr. Allena KatzPatel with Internal Medicine regarding pt's TF regimen and concern for aspiration.  Nutrition plan of care is to change to more concentrated TF formula, therefore decreasing TF rate.  Estimated Nutritional Needs:   Kcal: 1900-2100  Protein: 90-100 gm  Fluid:1.9-2.1 L/day  Will change to Vital 1.5 formula -- initiate at 15 ml/hr and increase by 10 ml every 6 hours to goal rate of 55 ml/hr.  This will provide 1980 kcals, 89 gm protein, 1008 ml of free water daily.  RD to continue to follow.  Maureen ChattersKatie Malloree Raboin, RD, LDN Pager #: [phone] After-Hours Pager #: [phone]

## 2015-07-04 NOTE — Progress Notes (Signed)
Pharmacy Antibiotic Note  Gregory Parker is a 60 y.o. male admitted on 06/04/2015 AMS, now with pneumonia.  Pharmacy has been consulted for levofloxacin dosing.  Plan: Levofloxacin 750 mg iv q24h  Height: 5\' 5"  (165.1 cm) Weight: 126 lb 1.7 oz (57.2 kg) IBW/kg (Calculated) : 61.5  Temp (24hrs), Avg:98.8 F (37.1 C), Min:98.5 F (36.9 C), Max:99.1 F (37.3 C)   Recent Labs Lab 06/30/15 0523 07/01/15 0335 07/01/15 0919 07/02/15 0329 07/03/15 0513 07/04/15 0530  WBC 29.1*  --  19.9*  --  20.3*  --   CREATININE 0.67 0.60*  --  0.72 0.63 0.70    Estimated Creatinine Clearance: 78.5 mL/min (by C-G formula based on Cr of 0.7).    Allergies  Allergen Reactions  . Chlorhexidine Swelling    Antimicrobials this admission: Vanc 3/1>>3/2 Acyclovir 3/1>>3/2 Unasyn 3/1>>3/2 CTX 3/1>>3/12 Levofloxacin 3/29>>  Microbiology results: 2/28 Blood - NGTD 3/1 CSF - neg 2/27 Blood - NGTD 2/28 TA - pending 2/27 MRSA - NEG 3/28 blood x 2  Gregory Parker, PharmD, BCPS, [facility] HospitalBCCCP Clinical Pharmacist Pager [phone] 07/04/2015 1:47 PM

## 2015-07-04 NOTE — Progress Notes (Signed)
PULMONARY / CRITICAL CARE MEDICINE   Name: Gregory Parker MRN: [id number] DOB: 08-17-1955    ADMISSION DATE:  06/04/2015 CONSULTATION DATE:  06/04/2015  [facility]ING [facility]  [facility]  CHIEF COMPLAINT:  AMS  SUBJECTIVE:  NAD on t collar. Still with inc trache secretions requiring frequent suctioning.  Question of TF per trache last night.   VITAL SIGNS: BP 99/72 mmHg  Pulse 80  Temp(Src) 98.6 F (37 C) (Oral)  Resp 16  Ht 5\' 5"  (1.651 m)  Wt 126 lb 1.7 oz (57.2 kg)  BMI 20.98 kg/m2  SpO2 100%  VENTILATOR SETTINGS: Vent Mode:  [-]  FiO2 (%):  [28 %] 28 %  INTAKE / OUTPUT: I/O last 3 completed shifts: In: 2544.5 [I.V.:200; NG/GT:2344.5] Out: 1600 [Urine:1600]  PHYSICAL EXAMINATION: General: ill-appearing, on TC Neuro: opens eyes, doesn't follow commands or do anything purposeful HEENT: massive tongue swelling looks stable 3/16, improving, trach site clean Cardiac: regular Chest: b/l rhonchi, decreased in bases Abd: soft, non tender Ext: 1+ edema Skin: no rashes  BMET  Recent Labs Lab 07/02/15 0329 07/03/15 0513 07/04/15 0530  NA 138 136 136  K 4.2 4.3 4.1  CL 101 99* 99*  CO2 25 26 27   BUN 24* 28* 29*  CREATININE 0.72 0.63 0.70  GLUCOSE 135* 134* 127*   Electrolytes  Recent Labs Lab 06/30/15 0523  07/02/15 0329 07/03/15 0513 07/04/15 0530  CALCIUM 9.1  < > 9.1 9.8 9.8  MG 1.8  --  1.8  --   --   PHOS 3.1  --   --   --   --   < > = values in this interval not displayed. CBC  Recent Labs Lab 06/30/15 0523 07/01/15 0919 07/03/15 0513  WBC 29.1* 19.9* 20.3*  HGB 9.4* 9.3* 11.1*  HCT 29.4* 29.2* 34.4*  PLT 479* 416* 467*   Coag's No results for input(s): APTT, INR in the last 168 hours. Sepsis Markers  Recent Labs Lab 06/30/15 1439 07/02/15 0329 07/04/15 0530  PROCALCITON 0.16 <0.10 <0.10   ABG No results for input(s): PHART, PCO2ART, PO2ART in the last 168 hours. Liver Enzymes  Recent Labs Lab 07/01/15 0335 07/01/15 0919  07/03/15 0513  AST 56* 47* 66*  ALT 98* 88* 110*  ALKPHOS 73 74 98  BILITOT 0.3 0.3 0.6  ALBUMIN 2.4* 2.3* 2.9*   Cardiac Enzymes No results for input(s): TROPONINI, PROBNP in the last 168 hours. Glucose  Recent Labs Lab 07/03/15 1222 07/03/15 1738 07/03/15 2329 07/04/15 0027 07/04/15 0339 07/04/15 0734  GLUCAP 167* 143* 114* 127* 123* 138*   Imaging Dg Chest Port 1 View  07/03/2015  CLINICAL DATA:  Respiratory distress EXAM: PORTABLE CHEST 1 VIEW COMPARISON:  June 30, 2015 FINDINGS: The heart size and mediastinal contours are within normal limits. Tracheostomy tube is unchanged. There is mild interstitial edema. There is no focal pneumonia or pleural effusion. The visualized skeletal structures are unremarkable. IMPRESSION: Mild interstitial edema. Electronically Signed   By: Sherian ReinWei-Chen  Lin M.D.   On: 07/03/2015 14:56     CULTURES: BCx2 2/27 , 2/28 >ng 3/2 resp >>> strep PNA (sens) 3/8 bld >> negative 3/8 resp >> nml flora\ 3/15 resp >> normal flora 3/15 blood >> 1 of 2 GPC >>  3/15 urine >> negative  LINES/TUBES: ETT 2/27>>> 3/10 Trach 3/10 >>  SIGNIFICANT EVENTS: 2/27 AMS, ? Seizure, int[facility] in [facility], [facility] 3/05 cardene gtt 3/05 dantrolene stopped 3/10 trached  STUDIES:  CT  head 2/27 >  No acute intracranial abnormality. Remote bilateral basal ganglia lacunar infarcts. Apparent soft tissue fullness in the nasopharynx could relate to retained secretions, especially given absence of mastoid effusions.  MRI brain 3/3>>>acute left basal ganglia lacunar EEG 3/6 nml  DISCUSSION: 60 yo male presented to [facility] with altered mental status.  He has hx of ETOH and CVA, and reported to smoke crack prior to admission.  Concern was he had alcohol withdrawal seizure.  Intubated for airway protection. Now w trach     ASSESSMENT / PLAN:  NEUROLOGIC A:   Acute encephalopathy. 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 P:   Monitor mental  status Thiamine, folic acid completed   PULMONARY A: Acute respiratory failure in setting of seizures an HCAP. Failure to wean s/p trach (3/10), Apparent angioedema, tongue enlargement >> better With inc trache secretions Concern for asp TF >> TF per trache per RN last night ? P:   ATC as tolerated. Increased secretion with frequent section still Solumedrol and pepcid given, completed. Diureses. On robinul Check CXR.  Cant be placed or transferred unless he has less trache secretions.    INFECTIOUS A:   CAP with pneumococcus in sputum. GPC on blood cx 1 of 2, ? Contaminant No acute infxn now P:   LP 3/1>>>negative Vancomycin 3/1>>>3/2 Rocephin 3/1>>>3/2>>> 3/12 Amp 3/1>>>3/2 Acyclovir 3/1>>>3/2  Completed course ceftriaxone 3/12 for pneumococcus  Repeated cx's 3/15, note 1 of 2 GPC blood cx. With dropping Pct will defer abx, follow    Disposition >> plan for LTAC/SNF once with less trache secretions  J. Alexis Frock, MD 07/04/2015, 11:47 AM [facility] Pager [phone] After 3 pm or if no answer, call [phone]

## 2015-07-04 NOTE — Progress Notes (Addendum)
[facility] Progress Note  Patient: Gregory Parker ZOX:[id number]   PCP: Isabella Stalling, MD DOB: 11-04-1955   DOA: 06/04/2015   DOS: 07/04/2015   Date of Service: the patient was seen and examined on 07/04/2015  Subjective: The patient is nonverbal, not following command, not showing any significant purposeful movements. After resuming tube feeding the patient had recurrence of increased tracheal secretions Nutrition: On tube feeding  Brief hospital course: Patient was admitted on 06/04/2015, with complaint of shaking episodes, was found to have status epilepticus, patient was intubated at the [facility] and transferred to [facility]. Patient was initially also started on broad-spectrum antibiotics with CNS coverage, but lumbar puncture was negative. His sputum grew Streptococcus and patient was treated with ceftriaxone for 8 days. Patient also developed rhabdomyolysis which improved with fluids. Initially was on dantrolene for spasticity. After discussing with patient's brother critical care decided to insert tracheostomy as well as PEG tube since the patient was not able to come off the vent. tube feedings were initiated, and the patient was transferred to hospitalist service on 06/30/2015. Currently further plan is identified and reduced the cause of excess tracheal secretion.  Assessment and Plan: 1. Status epilepticus [facility]) Patient presented with acute encephalopathy most up to secondary to alcohol withdrawal causing seizure. Patient was also suspected to have neuroleptic malignant syndrome during the hospitalization. Currently noncommunicative, not following commands. Difficult to understand patient's baseline. I suspect that the possibility of improvement in his mental status is less likely as the patient may have sustained significant hypoxic brain damage. Initially the patient was given IV Keppra which was discontinued on 06/08/2015 since there was no evidence of  seizure and EEG was unremarkable. Currently status post trach and PEG. Monitor clinically, patient was on fentanyl in the ICU and has been transferred to step down with fentanyl patch, we will gradually reduce the dose.  2. Acute respiratory failure with hypoxia Status post tracheostomy. Pneumococcal pneumonia. Excessive tracheal secretion. Recurrent aspiration is showing positive Gram stain, we will await for the culture. We'll start the patient on levofloxacin.  Blood cultures are negative performed on 07/03/2015. Increased secretion can also be likely aspiration from tube feeding. Requesting dietitian to reduce the rate of the tube feeding. Discuss with interventional radiology and at present he feels that the tube is in appropriate position and should be appropriately working. We'll monitor aspiration precautions for the patient. Use scopolamine patch. Gradually taper steroids Continue IV Lasix at present.  3. Essential hypertension. Continue Cardizem at present. We will avoid combination of Cardizem and amlodipine. Continue Lasix. Continue labetalol as needed.  4. Constipation. Patient did have a bowel movement on 07/02/2015. We will use schedules stool softener.  5. Spasticity with rhabdomyolysis. Peak CK 5000. Improved with IV dantrolene. Currently renal function stable. We'll monitor.  6. Acute left basal ganglia lacunar infarct. Neurology consulted. Continue aspirin Echocardiogram shows ejection fraction 60-65%. CTA head and neck unremarkable.  7. Alcohol abuse. Out of the window for withdrawal at present. Continue to closely monitor  8. Protein calorie malnutrition. Continue tube feeding. Reduce the rate.  Activity: physical therapy SNF Bowel regimen: last BM 07/02/2015 after enema DVT Prophylaxis: subcutaneous Heparin Nutrition: On tube feeding Advance goals of care discussion: Full code  Procedures: Intubation and tracheostomy insertion, PEG tube  insertion EEG to 2/28, 3/06 Echocardiogram Consultants: Critical care primarily admission, neurology, interventional radiology, Antibiotics: Anti-infectives    Start     Dose/Rate Route Frequency Ordered Stop   06/26/15 1000  ceFAZolin (ANCEF) IVPB 2 g/50 mL premix     2 g 100 mL/hr over 30 Minutes Intravenous To Short Stay 06/26/15 0927 06/26/15 1023   06/26/15 0904  ceFAZolin (ANCEF) 2-3 GM-% IVPB SOLR    Comments:  Dhers, Patricia   : cabinet override      06/26/15 0904 06/26/15 2114   06/25/15 0000  ceFAZolin (ANCEF) IVPB 2 g/50 mL premix     2 g 100 mL/hr over 30 Minutes Intravenous To Radiology 06/23/15 1903 06/25/15 0121   06/10/15 1000  cefTRIAXone (ROCEPHIN) 2 g in dextrose 5 % 50 mL IVPB     2 g 100 mL/hr over 30 Minutes Intravenous Every 12 hours 06/10/15 0958 06/17/15 1011   06/10/15 0945  cefTRIAXone (ROCEPHIN) 1 g in dextrose 5 % 50 mL IVPB  Status:  Discontinued     1 g 100 mL/hr over 30 Minutes Intravenous Every 24 hours 06/10/15 0934 06/10/15 0958   06/07/15 2330  cefTRIAXone (ROCEPHIN) 1 g in dextrose 5 % 50 mL IVPB  Status:  Discontinued     1 g 100 mL/hr over 30 Minutes Intravenous Daily at bedtime 06/07/15 2248 06/10/15 0921   06/07/15 0130  vancomycin (VANCOCIN) 500 mg in sodium chloride 0.9 % 100 mL IVPB  Status:  Discontinued     500 mg 100 mL/hr over 60 Minutes Intravenous Every 12 hours 06/06/15 1228 06/07/15 1123   06/06/15 1330  acyclovir (ZOVIRAX) 550 mg in dextrose 5 % 100 mL IVPB  Status:  Discontinued     10 mg/kg  55 kg 111 mL/hr over 60 Minutes Intravenous Every 8 hours 06/06/15 1228 06/07/15 1123   06/06/15 1330  vancomycin (VANCOCIN) IVPB 1000 mg/200 mL premix     1,000 mg 200 mL/hr over 60 Minutes Intravenous  Once 06/06/15 1228 06/06/15 1625   06/06/15 1330  Ampicillin-Sulbactam (UNASYN) 3 g in sodium chloride 0.9 % 100 mL IVPB  Status:  Discontinued     3 g 100 mL/hr over 60 Minutes Intravenous Every 8 hours 06/06/15 1228 06/07/15 1123    06/06/15 1300  cefTRIAXone (ROCEPHIN) 2 g in dextrose 5 % 50 mL IVPB  Status:  Discontinued     2 g 100 mL/hr over 30 Minutes Intravenous Every 12 hours 06/06/15 1207 06/07/15 1123   06/06/15 1215  cefTRIAXone (ROCEPHIN) 2 g in dextrose 5 % 50 mL IVPB  Status:  Discontinued     2 g 100 mL/hr over 30 Minutes Intravenous Every 24 hours 06/06/15 1200 06/06/15 1206       Family Communication: no family was present at bedside, at the time of interview.   Disposition:  Barriers to safe discharge: Transfer to SNF when secretions are improved   Intake/Output Summary (Last 24 hours) at 07/04/15 1244 Last data filed at 07/04/15 1134  Gross per 24 hour  Intake   1130 ml  Output   1950 ml  Net   -820 ml   Filed Weights   07/01/15 0430 07/02/15 0500 07/03/15 0500  Weight: 57.4 kg (126 lb 8.7 oz) 57 kg (125 lb 10.6 oz) 57.2 kg (126 lb 1.7 oz)    Objective: Physical Exam: Filed Vitals:   07/04/15 0745 07/04/15 0750 07/04/15 1119 07/04/15 1132  BP:  104/73  99/72  Pulse:  104 77 80  Temp:  99.1 F (37.3 C)  98.6 F (37 C)  TempSrc:  Oral  Oral  Resp:  Height:      Weight:  SpO2: 100% 93% 100% 100%     General: Appear in moderate distress, no Rash; Oral Mucosa moist Cardiovascular: S1 and S2 Present, no Murmur,  Respiratory: Bilateral Air entry present and bilateral Crackles, no wheezes Abdomen: Bowel Sound sluggish, Soft and no tenderness Extremities: no Pedal edema, no calf tenderness Neurology: Grossly no focal neuro deficit.  Data Reviewed: CBC:  Recent Labs Lab 06/30/15 0523 07/01/15 0919 07/03/15 0513  WBC 29.1* 19.9* 20.3*  HGB 9.4* 9.3* 11.1*  HCT 29.4* 29.2* 34.4*  MCV 92.2 92.7 92.7  PLT 479* 416* 467*   Basic Metabolic Panel:  Recent Labs Lab 06/30/15 0523 07/01/15 0335 07/02/15 0329 07/03/15 0513 07/04/15 0530  NA 137 137 138 136 136  K 4.8 4.6 4.2 4.3 4.1  CL 103 103 101 99* 99*  CO2 GLUCOSE 156* 222* 135* 134*  127*  BUN 22* 24* 24* 28* 29*  CREATININE 0.67 0.60* 0.72 0.63 0.70  CALCIUM 9.1 9.0 9.1 9.8 9.8  MG 1.8  --  1.8  --   --   PHOS 3.1  --   --   --   --    Liver Function Tests:  Recent Labs Lab 07/01/15 0335 07/01/15 0919 07/03/15 0513  AST 56* 47* 66*  ALT 98* 88* 110*  ALKPHOS 73 74 98  BILITOT 0.3 0.3 0.6  PROT 6.8 6.4* 7.7  ALBUMIN 2.4* 2.3* 2.9*   No results for input(s): LIPASE, AMYLASE in the last 168 hours. No results for input(s): AMMONIA in the last 168 hours.  Cardiac Enzymes: No results for input(s): CKTOTAL, CKMB, CKMBINDEX, TROPONINI in the last 168 hours.  BNP (last 3 results) No results for input(s): BNP in the last 8760 hours.  CBG:  Recent Labs Lab 07/03/15 2329 07/04/15 0027 07/04/15 0339 07/04/15 0734 07/04/15 1136  GLUCAP 114* 127* 123* 138* 149*    Recent Results (from the past 240 hour(s))  Culture, respiratory (NON-Expectorated)     Status: None (Preliminary result)   Collection Time: 07/03/15  7:40 AM  Result Value Ref Range Status   Specimen Description TRACHEAL ASPIRATE  Final   Special Requests NONE  Final   Gram Stain   Final    ABUNDANT WBC PRESENT, PREDOMINANTLY PMN NO SQUAMOUS EPITHELIAL CELLS SEEN MODERATE GRAM POSITIVE COCCI IN PAIRS MODERATE GRAM NEGATIVE RODS FEW GRAM POSITIVE RODS Performed at [facility]    Culture   Final    Culture reincubated for better growth Performed at [facility]    Report Status PENDING  Incomplete  Culture, blood (routine x 2)     Status: None (Preliminary result)   Collection Time: 07/03/15  7:45 AM  Result Value Ref Range Status   Specimen Description BLOOD RIGHT ARM  Final   Special Requests BOTTLES DRAWN AEROBIC AND ANAEROBIC 10CC  Final   Culture NO GROWTH 1 DAY  Final   Report Status PENDING  Incomplete  Culture, blood (routine x 2)     Status: None (Preliminary result)   Collection Time: 07/03/15  7:50 AM  Result Value Ref Range Status   Specimen  Description BLOOD RIGHT HAND  Final   Special Requests BOTTLES DRAWN AEROBIC AND ANAEROBIC 10CC  Final   Culture NO GROWTH 1 DAY  Final   Report Status PENDING  Incomplete     Studies: Dg Chest Port 1 View  07/04/2015  CLINICAL DATA:  Possible aspiration EXAM: PORTABLE CHEST 1 VIEW COMPARISON:  07/03/2015 FINDINGS: Tracheostomy  tube is again identified. The cardiac shadow is stable. The lungs are well aerated bilaterally. Interstitial changes seen on the prior exam are again noted and stable. No new focal infiltrate is seen. No bony abnormality is noted. IMPRESSION: Stable appearance of the chest when compared with the previous day. Electronically Signed   By: Alcide CleverMark  Lukens M.D.   On: 07/04/2015 12:36   Dg Chest Port 1 View  07/03/2015  CLINICAL DATA:  Respiratory distress EXAM: PORTABLE CHEST 1 VIEW COMPARISON:  June 30, 2015 FINDINGS: The heart size and mediastinal contours are within normal limits. Tracheostomy tube is unchanged. There is mild interstitial edema. There is no focal pneumonia or pleural effusion. The visualized skeletal structures are unremarkable. IMPRESSION: Mild interstitial edema. Electronically Signed   By: Sherian ReinWei-Chen  Lin M.D.   On: 07/03/2015 14:56     Scheduled Meds: . aspirin  81 mg Per Tube Daily  . bisacodyl  10 mg Rectal Daily  . diltiazem  60 mg Per Tube 4 times per day  . famotidine  20 mg Per Tube BID  . fentaNYL  50 mcg Transdermal Q72H  . free water  50 mL Per Tube Q6H  . furosemide  40 mg Intravenous BID  . guaiFENesin  600 mg Oral BID  . heparin  5,000 Units Subcutaneous 3 times per day  . insulin aspart  0-9 Units Subcutaneous TID WC  . levalbuterol  0.63 mg Nebulization Q6H  . nystatin  5 mL Oral QID  . polyethylene glycol  17 g Oral Daily  . predniSONE  10 mg Oral Q breakfast  . scopolamine  1 patch Transdermal Q72H  . senna-docusate  2 tablet Per Tube BID   Continuous Infusions: . feeding supplement (VITAL 1.5 CAL)     PRN Meds: acetaminophen  (TYLENOL) oral liquid 160 mg/5 mL, fentaNYL (SUBLIMAZE) injection, labetalol  Time spent: 30 minutes  Author: Lynden OxfordPranav Jacody Beneke, MD [facility] Pager: [phone] 07/04/2015 12:44 PM  If 7PM-7AM, please contact night-coverage at [email or web address], password [email or web address]

## 2015-07-04 NOTE — Discharge Instructions (Signed)
Acute Respiratory Distress Syndrome °Acute respiratory distress syndrome is a life-threatening condition in which fluid collects in the lungs. This condition can cause severe shortness of breath and low oxygen levels in the blood. It can also cause the lungs and other vital organs to fail. The condition usually develops within 24 to 48 hours of an infection, illness, surgery, or injury. °CAUSES °This condition may be caused by: °· An infection, such as sepsis or pneumonia. °· A serious injury to the head or chest. °· A major surgery. °· A drug overdose. °· Breathing in harmful chemicals or smoke. °· Blood transfusions. °· A blood clot in the lungs. °SYMPTOMS °Symptoms of this condition include: °· Fever. °· Difficulty breathing. °· Bluish skin (cyanosis). °· A fast or irregular heartbeat. °· Low blood pressure (hypotension). °· Agitation. °· Confusion. °· Lack of energy (lethargy). °· Sweating. °DIAGNOSIS °This condition may be diagnosed by using tests to rule out other diseases and conditions that cause similar symptoms. You may have: °· A chest X-ray or CT scan. °· Blood tests. °· A sputum culture. In this test, you will be asked to spit so that a sample of lung fluid can be taken for testing. °· Bronchoscopy. During this test a thin, flexible tool is passed into the mouth or nose, down the windpipe, and into the lungs. °TREATMENT °This condition may be treated with: °· Oxygen. A breathing machine (ventilator) is often used to provide oxygen and help with breathing. °· Medicine to help you relax (sedative). °· Fluids and nutrients given through an IV tube. °· Blood pressure medicine. °· Steroid medicine to help decrease inflammation in the lungs. °· Diuretic medicine to get rid of extra fluid in the body. °Additional treatment may be needed, depending on the cause of the condition. It can take up to 12 months to recover from this condition. Some people recover fully, but others may continue to  have: °· Weakness. °· Shortness of breath. °· Memory problems. °· Depression. °HOME CARE INSTRUCTIONS °Until you recover from this condition: °· Do not smoke. °· Limit alcohol intake to no more than 1 drink per day for nonpregnant women and 2 drinks per day for men. One drink equals 12 oz of beer, 5 oz of wine, or 1½ oz of hard liquor. °· Ask friends and family to help you if daily activities make you tired. °SEEK MEDICAL CARE IF: °· You become short of breath with activity or while resting. °· You develop a cough that does not go away. °· You have a fever. °SEEK IMMEDIATE MEDICAL CARE IF: °· You have sudden shortness of breath. °· You develop chest pain that does not go away. °· You develop swelling or pain in one of your legs. °· You cough up blood. °  °This information is not intended to replace advice given to you by your health care provider. Make sure you discuss any questions you have with your health care provider. °  °Document Released: 03/24/2005 Document Revised: 08/08/2014 Document Reviewed: 03/20/2014 °Elsevier Interactive Patient Education ©2016 [employer] ° °

## 2015-07-05 LAB — CBC
HEMATOCRIT: 37.4 % — AB (ref 39.0–52.0)
Hemoglobin: 12.1 g/dL — ABNORMAL LOW (ref 13.0–17.0)
MCH: 29.9 pg (ref 26.0–34.0)
MCHC: 32.4 g/dL (ref 30.0–36.0)
MCV: 92.3 fL (ref 78.0–100.0)
PLATELETS: 410 10*3/uL — AB (ref 150–400)
RBC: 4.05 MIL/uL — ABNORMAL LOW (ref 4.22–5.81)
RDW: 16.1 % — AB (ref 11.5–15.5)
WBC: 13.7 10*3/uL — AB (ref 4.0–10.5)

## 2015-07-05 LAB — GLUCOSE, CAPILLARY
GLUCOSE-CAPILLARY: 100 mg/dL — AB (ref 65–99)
GLUCOSE-CAPILLARY: 130 mg/dL — AB (ref 65–99)
GLUCOSE-CAPILLARY: 139 mg/dL — AB (ref 65–99)
Glucose-Capillary: 160 mg/dL — ABNORMAL HIGH (ref 65–99)
Glucose-Capillary: 165 mg/dL — ABNORMAL HIGH (ref 65–99)
Glucose-Capillary: 147 mg/dL — ABNORMAL HIGH (ref 65–99)

## 2015-07-05 LAB — MAGNESIUM: MAGNESIUM: 2 mg/dL (ref 1.7–2.4)

## 2015-07-05 LAB — BASIC METABOLIC PANEL
Anion gap: 15 (ref 5–15)
BUN: 37 mg/dL — AB (ref 6–20)
CALCIUM: 9.9 mg/dL (ref 8.9–10.3)
CO2: 25 mmol/L (ref 22–32)
CREATININE: 0.91 mg/dL (ref 0.61–1.24)
Chloride: 96 mmol/L — ABNORMAL LOW (ref 101–111)
GFR calc Af Amer: >60 mL/min (ref >60)
GLUCOSE: 153 mg/dL — AB (ref 65–99)
POTASSIUM: 4.5 mmol/L (ref 3.5–5.1)
SODIUM: 136 mmol/L (ref 135–145)

## 2015-07-05 LAB — HEPATIC FUNCTION PANEL
ALT: 103 U/L — ABNORMAL HIGH (ref 17–63)
AST: 69 U/L — AB (ref 15–41)
Albumin: 3 g/dL — ABNORMAL LOW (ref 3.5–5.0)
Alkaline Phosphatase: 106 U/L (ref 38–126)
BILIRUBIN DIRECT: 0.2 mg/dL (ref 0.1–0.5)
BILIRUBIN INDIRECT: 0.3 mg/dL (ref 0.3–0.9)
BILIRUBIN TOTAL: 0.5 mg/dL (ref 0.3–1.2)
Total Protein: 7.8 g/dL (ref 6.5–8.1)

## 2015-07-05 LAB — CULTURE, RESPIRATORY: CULTURE: NORMAL

## 2015-07-05 LAB — PROTIME-INR
INR: 1.02 (ref 0.00–1.49)
Prothrombin Time: 13.6 seconds (ref 11.6–15.2)

## 2015-07-05 LAB — CULTURE, RESPIRATORY W GRAM STAIN

## 2015-07-05 MED ORDER — FUROSEMIDE 10 MG/ML IJ SOLN
60.0000 mg | Freq: Every day | INTRAMUSCULAR | Status: DC
  Administered 2015-07-06: 60 mg via INTRAVENOUS
  Filled 2015-07-05: qty 6

## 2015-07-05 MED ORDER — FENTANYL 25 MCG/HR TD PT72
37.5000 ug | MEDICATED_PATCH | TRANSDERMAL | Status: DC
  Administered 2015-07-06: 37.5 ug via TRANSDERMAL
  Filled 2015-07-05: qty 1

## 2015-07-05 MED ORDER — FUROSEMIDE 10 MG/ML IJ SOLN
20.0000 mg | Freq: Once | INTRAMUSCULAR | Status: AC
  Administered 2015-07-05: 20 mg via INTRAVENOUS
  Filled 2015-07-05: qty 2

## 2015-07-05 NOTE — Progress Notes (Signed)
07/05/2015- Respiratory care note- Pt has a #6 shiley trach cuffless in place.  Pt also has a 28% aerosol trach collar in place.  Trach care supplies at bedside.  Pt continues to have a mod to large amt of secretions at bedside.  Will cont to monitor progress.

## 2015-07-05 NOTE — Progress Notes (Signed)
CSW continues to follow pt for placement needs- pt discussed in progression- continuing to have high suctioning needs- still not appropriate for SNF  Merlyn LotJenna Holoman, [facility]SWA Clinical Social Worker [phone]

## 2015-07-05 NOTE — Progress Notes (Signed)
[facility] Progress Note  Patient: Gregory Parker QQV:[id number]   PCP: Isabella Stalling, MD DOB: [birth year]   DOA: 06/04/2015   DOS: 07/05/2015   Date of Service: the patient was seen and examined on 07/05/2015  Subjective: The patient is more awake this morning, continues to remain nonverbal not following command, but does show purposeful movements Secretion appears to have improved after reducing the tube feeding rate Nutrition: On tube feeding  Brief hospital course: Patient was admitted on 06/04/2015, with complaint of shaking episodes, was found to have status epilepticus, patient was intubated at the [facility] and transferred to [facility]. Patient was initially also started on broad-spectrum antibiotics with CNS coverage, but lumbar puncture was negative. His sputum grew Streptococcus and patient was treated with ceftriaxone for 8 days. Patient also developed rhabdomyolysis which improved with fluids. Initially was on dantrolene for spasticity. After discussing with patient's brother critical care decided to insert tracheostomy as well as PEG tube since the patient was not able to come off the vent. tube feedings were initiated, and the patient was transferred to hospitalist service on 06/30/2015. Currently further plan is to reduce excess tracheal secretion.  Assessment and Plan: 1. Status epilepticus [facility]) Patient presented with acute encephalopathy most up to secondary to alcohol withdrawal causing seizure. Patient was also suspected to have neuroleptic malignant syndrome during the hospitalization. Currently noncommunicative, not following commands. Difficult to understand patient's baseline. I suspect that the possibility of improvement in his mental status is less likely as the patient may have sustained significant hypoxic brain damage. Initially the patient was given IV Keppra which was discontinued on 06/08/2015 since there was no evidence of seizure and  EEG was unremarkable. Currently status post trach and PEG. Monitor clinically, patient was on fentanyl in the ICU and has been transferred to step down with fentanyl patch, we will gradually reduce the dose.  2. Acute respiratory failure with hypoxia Status post tracheostomy. Pneumococcal pneumonia. Excessive tracheal secretion. Recurrent aspiration  Tracheal culture is negative for any growth even though the Gram stain was positive. We'll discontinue levofloxacin.  Blood cultures are negative performed on 07/03/2015. Increased secretion can also be likely aspiration from tube feeding. Discuss with interventional radiology and at present he feels that the tube is in appropriate position and should be appropriately working. We'll monitor aspiration precautions for the patient. Use scopolamine patch. Gradually taper steroids Continue IV Lasix at present reduce from 40 twice a day to 60 daily  3. Essential hypertension. Continue Cardizem at present. We will avoid combination of Cardizem and amlodipine. Continue Lasix. Continue labetalol as needed.  4. Constipation. Patient did have a bowel movement on 07/02/2015. We will use schedules stool softener.  5. Spasticity with rhabdomyolysis. Peak CK 5000. Improved with IV dantrolene. Currently renal function stable. We'll monitor.  6. Acute left basal ganglia lacunar infarct. Neurology consulted. Continue aspirin Echocardiogram shows ejection fraction 60-65%. CTA head and neck unremarkable.  7. Alcohol abuse. Out of the window for withdrawal at present. Continue to closely monitor  8. Protein calorie malnutrition. Continue tube feeding. Reduce the rate.  Activity: physical therapy SNF Bowel regimen: last BM 07/02/2015 after enema DVT Prophylaxis: subcutaneous Heparin Nutrition: On tube feeding Advance goals of care discussion: Full code  Procedures: Intubation and tracheostomy insertion, PEG tube insertion EEG to 2/28,  3/06 Echocardiogram Consultants: Critical care primarily admission, neurology, interventional radiology, Antibiotics: Anti-infectives    Start     Dose/Rate Route Frequency Ordered Stop   07/04/15 1500  levofloxacin (LEVAQUIN) IVPB 750 mg  Status:  Discontinued     750 mg 100 mL/hr over 90 Minutes Intravenous Every 24 hours 07/04/15 1348 07/05/15 1102   06/26/15 1000  ceFAZolin (ANCEF) IVPB 2 g/50 mL premix     2 g 100 mL/hr over 30 Minutes Intravenous To Short Stay 06/26/15 0927 06/26/15 1023   06/26/15 0904  ceFAZolin (ANCEF) 2-3 GM-% IVPB SOLR    Comments:  Dhers, Patricia   : cabinet override      06/26/15 0904 06/26/15 2114   06/25/15 0000  ceFAZolin (ANCEF) IVPB 2 g/50 mL premix     2 g 100 mL/hr over 30 Minutes Intravenous To Radiology 06/23/15 1903 06/25/15 0121   06/10/15 1000  cefTRIAXone (ROCEPHIN) 2 g in dextrose 5 % 50 mL IVPB     2 g 100 mL/hr over 30 Minutes Intravenous Every 12 hours 06/10/15 0958 06/17/15 1011   06/10/15 0945  cefTRIAXone (ROCEPHIN) 1 g in dextrose 5 % 50 mL IVPB  Status:  Discontinued     1 g 100 mL/hr over 30 Minutes Intravenous Every 24 hours 06/10/15 0934 06/10/15 0958   06/07/15 2330  cefTRIAXone (ROCEPHIN) 1 g in dextrose 5 % 50 mL IVPB  Status:  Discontinued     1 g 100 mL/hr over 30 Minutes Intravenous Daily at bedtime 06/07/15 2248 06/10/15 0921   06/07/15 0130  vancomycin (VANCOCIN) 500 mg in sodium chloride 0.9 % 100 mL IVPB  Status:  Discontinued     500 mg 100 mL/hr over 60 Minutes Intravenous Every 12 hours 06/06/15 1228 06/07/15 1123   06/06/15 1330  acyclovir (ZOVIRAX) 550 mg in dextrose 5 % 100 mL IVPB  Status:  Discontinued     10 mg/kg  55 kg 111 mL/hr over 60 Minutes Intravenous Every 8 hours 06/06/15 1228 06/07/15 1123   06/06/15 1330  vancomycin (VANCOCIN) IVPB 1000 mg/200 mL premix     1,000 mg 200 mL/hr over 60 Minutes Intravenous  Once 06/06/15 1228 06/06/15 1625   06/06/15 1330  Ampicillin-Sulbactam (UNASYN) 3 g in  sodium chloride 0.9 % 100 mL IVPB  Status:  Discontinued     3 g 100 mL/hr over 60 Minutes Intravenous Every 8 hours 06/06/15 1228 06/07/15 1123   06/06/15 1300  cefTRIAXone (ROCEPHIN) 2 g in dextrose 5 % 50 mL IVPB  Status:  Discontinued     2 g 100 mL/hr over 30 Minutes Intravenous Every 12 hours 06/06/15 1207 06/07/15 1123   06/06/15 1215  cefTRIAXone (ROCEPHIN) 2 g in dextrose 5 % 50 mL IVPB  Status:  Discontinued     2 g 100 mL/hr over 30 Minutes Intravenous Every 24 hours 06/06/15 1200 06/06/15 1206       Family Communication: no family was present at bedside, at the time of interview.   Disposition:  Barriers to safe discharge: Transfer to SNF when secretions are improved   Intake/Output Summary (Last 24 hours) at 07/05/15 1103 Last data filed at 07/05/15 0700  Gross per 24 hour  Intake   1340 ml  Output   1250 ml  Net     90 ml   Filed Weights   07/02/15 0500 07/03/15 0500 07/05/15 0400  Weight: 57 kg (125 lb 10.6 oz) 57.2 kg (126 lb 1.7 oz) 51.3 kg (113 lb 1.5 oz)    Objective: Physical Exam: Filed Vitals:   07/05/15 0325 07/05/15 0400 07/05/15 0738 07/05/15 0841  BP: 122/85  133/100   Pulse: 95  87   Temp: 98.8 F (37.1 C)  98.4 F (36.9 C)   TempSrc: Oral  Oral   Resp: 21  19   Height:      Weight:  51.3 kg (113 lb 1.5 oz)    SpO2: 100%  98% 97%    General: Appear in moderate distress, no Rash; Oral Mucosa moist Cardiovascular: S1 and S2 Present, no Murmur,  Respiratory: Bilateral Air entry present and bilateral Crackles, no wheezes Abdomen: Bowel Sound sluggish, Soft and no tenderness Extremities: no Pedal edema, no calf tenderness Neurology: Grossly no New focal neuro deficit.  Data Reviewed: CBC:  Recent Labs Lab 06/30/15 0523 07/01/15 0919 07/03/15 0513 07/05/15 0625  WBC 29.1* 19.9* 20.3* 13.7*  HGB 9.4* 9.3* 11.1* 12.1*  HCT 29.4* 29.2* 34.4* 37.4*  MCV 92.2 92.7 92.7 92.3  PLT 479* 416* 467* 410*   Basic Metabolic Panel:  Recent  Labs Lab 06/30/15 0523 07/01/15 0335 07/02/15 0329 07/03/15 0513 07/04/15 0530 07/05/15 0625  NA 137 137 138 136 136 136  K 4.8 4.6 4.2 4.3 4.1 4.5  CL 103 103 101 99* 99* 96*  CO2 25 23 25 26 27 25   GLUCOSE 156* 222* 135* 134* 127* 153*  BUN 22* 24* 24* 28* 29* 37*  CREATININE 0.67 0.60* 0.72 0.63 0.70 0.91  CALCIUM 9.1 9.0 9.1 9.8 9.8 9.9  MG 1.8  --  1.8  --   --  2.0  PHOS 3.1  --   --   --   --   --    Liver Function Tests:  Recent Labs Lab 07/01/15 0335 07/01/15 0919 07/03/15 0513 07/05/15 0625  AST 56* 47* 66* 69*  ALT 98* 88* 110* 103*  ALKPHOS 73 74 98 106  BILITOT 0.3 0.3 0.6 0.5  PROT 6.8 6.4* 7.7 7.8  ALBUMIN 2.4* 2.3* 2.9* 3.0*   No results for input(s): LIPASE, AMYLASE in the last 168 hours. No results for input(s): AMMONIA in the last 168 hours.  Cardiac Enzymes: No results for input(s): CKTOTAL, CKMB, CKMBINDEX, TROPONINI in the last 168 hours.  BNP (last 3 results) No results for input(s): BNP in the last 8760 hours.  CBG:  Recent Labs Lab 07/04/15 1520 07/04/15 2004 07/05/15 0015 07/05/15 0336 07/05/15 0736  GLUCAP 124* 87 100* 139* 160*    Recent Results (from the past 240 hour(s))  Culture, respiratory (NON-Expectorated)     Status: None   Collection Time: 07/03/15  7:40 AM  Result Value Ref Range Status   Specimen Description TRACHEAL ASPIRATE  Final   Special Requests NONE  Final   Gram Stain   Final    ABUNDANT WBC PRESENT, PREDOMINANTLY PMN NO SQUAMOUS EPITHELIAL CELLS SEEN MODERATE GRAM POSITIVE COCCI IN PAIRS MODERATE GRAM NEGATIVE RODS FEW GRAM POSITIVE RODS Performed at [facility]    Culture   Final    NORMAL OROPHARYNGEAL FLORA Performed at [facility]    Report Status 07/05/2015 FINAL  Final  Culture, blood (routine x 2)     Status: None (Preliminary result)   Collection Time: 07/03/15  7:45 AM  Result Value Ref Range Status   Specimen Description BLOOD RIGHT ARM  Final   Special Requests  BOTTLES DRAWN AEROBIC AND ANAEROBIC 10CC  Final   Culture NO GROWTH 1 DAY  Final   Report Status PENDING  Incomplete  Culture, blood (routine x 2)     Status: None (Preliminary result)   Collection Time: 07/03/15  7:50 AM  Result Value Ref Range Status   Specimen Description BLOOD RIGHT HAND  Final   Special Requests BOTTLES DRAWN AEROBIC AND ANAEROBIC 10CC  Final   Culture NO GROWTH 1 DAY  Final   Report Status PENDING  Incomplete     Studies: Dg Chest Port 1 View  07/04/2015  CLINICAL DATA:  Possible aspiration EXAM: PORTABLE CHEST 1 VIEW COMPARISON:  07/03/2015 FINDINGS: Tracheostomy tube is again identified. The cardiac shadow is stable. The lungs are well aerated bilaterally. Interstitial changes seen on the prior exam are again noted and stable. No new focal infiltrate is seen. No bony abnormality is noted. IMPRESSION: Stable appearance of the chest when compared with the previous day. Electronically Signed   By: Alcide Clever M.D.   On: 07/04/2015 12:36     Scheduled Meds: . aspirin  81 mg Per Tube Daily  . diltiazem  60 mg Per Tube 4 times per day  . famotidine  20 mg Per Tube BID  . fentaNYL  50 mcg Transdermal Q72H  . free water  50 mL Per Tube Q6H  . furosemide  40 mg Intravenous BID  . guaiFENesin  600 mg Oral BID  . heparin  5,000 Units Subcutaneous 3 times per day  . insulin aspart  0-9 Units Subcutaneous TID WC  . levalbuterol  0.63 mg Nebulization Q6H  . nystatin  5 mL Oral QID  . polyethylene glycol  17 g Oral Daily  . predniSONE  10 mg Oral Q breakfast  . scopolamine  1 patch Transdermal Q72H  . senna-docusate  2 tablet Per Tube BID   Continuous Infusions: . feeding supplement (VITAL 1.5 CAL) 1,000 mL (07/04/15 1258)   PRN Meds: acetaminophen (TYLENOL) oral liquid 160 mg/5 mL, fentaNYL (SUBLIMAZE) injection, labetalol  Time spent: 30 minutes  Author: Lynden Oxford, MD [facility] Pager: [phone] 07/05/2015 11:03 AM  If 7PM-7AM, please contact  night-coverage at [email or web address], password [facility]

## 2015-07-05 NOTE — Progress Notes (Signed)
PULMONARY / CRITICAL CARE MEDICINE   Name: Gregory Parker MRN: [id number] DOB: 21-Nov-1955    ADMISSION DATE:  06/04/2015 CONSULTATION DATE:  06/04/2015  REFERRING MD:  EDP at [facility]  CHIEF COMPLAINT:  AMS  SUBJECTIVE:  NAD on t collar.  Less trache secretions now. On 28% TC.   VITAL SIGNS: BP 132/97 mmHg  Pulse 105  Temp(Src) 99.8 F (37.7 C) (Axillary)  Resp 33  Ht  (1.651 m)  Wt 113 lb 1.5 oz (51.3 kg)  BMI 18.82 kg/m2  SpO2 100%  VENTILATOR SETTINGS: Vent Mode:  [-]  FiO2 (%):  [28 %] 28 %  INTAKE / OUTPUT: I/O last 3 completed shifts: In: 1590 [Other:200; NG/GT:1240; IV Piggyback:150] Out: 2050 [Urine:2050]  PHYSICAL EXAMINATION: General: ill-appearing, on TC Neuro: opens eyes, doesn't follow commands or do anything purposeful HEENT: massive tongue swelling looks stable 3/16, improving, trach site clean Cardiac: regular Chest: b/l rhonchi, decreased in bases Abd: soft, non tender Ext: 1+ edema Skin: no rashes  BMET  Recent Labs Lab 07/03/15 0513 07/04/15 0530 07/05/15 0625  NA 136 136 136  K 4.3 4.1 4.5  CL 99* 99* 96*  CO2 BUN 28* 29* 37*  CREATININE 0.63 0.70 0.91  GLUCOSE 134* 127* 153*   Electrolytes  Recent Labs Lab 06/30/15 0523  07/02/15 0329 07/03/15 0513 07/04/15 0530 07/05/15 0625  CALCIUM 9.1  < > 9.1 9.8 9.8 9.9  MG 1.8  --  1.8  --   --  2.0  PHOS 3.1  --   --   --   --   --   < > = values in this interval not displayed. CBC  Recent Labs Lab 07/01/15 0919 07/03/15 0513 07/05/15 0625  WBC 19.9* 20.3* 13.7*  HGB 9.3* 11.1* 12.1*  HCT 29.2* 34.4* 37.4*  PLT 416* 467* 410*   Coag's  Recent Labs Lab 07/05/15 0501  INR 1.02   Sepsis Markers  Recent Labs Lab 06/30/15 1439 07/02/15 0329 07/04/15 0530  PROCALCITON 0.16 <0.10 <0.10   ABG No results for input(s): PHART, PCO2ART, PO2ART in the last 168 hours. Liver Enzymes  Recent Labs Lab 07/01/15 0919 07/03/15 0513 07/05/15 0625  AST  47* 66* 69*  ALT 88* 110* 103*  ALKPHOS 74 98 106  BILITOT 0.3 0.6 0.5  ALBUMIN 2.3* 2.9* 3.0*   Cardiac Enzymes No results for input(s): TROPONINI, PROBNP in the last 168 hours. Glucose  Recent Labs Lab 07/04/15 1136 07/04/15 1520 07/04/15 2004 07/05/15 0015 07/05/15 0336 07/05/15 0736  GLUCAP 149* 124* 87 100* 139* 160*   Imaging No results found.   CULTURES: BCx2 2/27 , 2/28 >ng 3/2 resp >>> strep PNA (sens) 3/8 bld >> negative 3/8 resp >> nml flora\ 3/15 resp >> normal flora 3/15 blood >> 1 of 2 GPC >>  3/15 urine >> negative  LINES/TUBES: ETT 2/27>>> 3/10 Trach 3/10 >>  SIGNIFICANT EVENTS: 2/27 AMS, ? Seizure, intubated in [facility] ED, tx to [facility] ICU 3/05 cardene gtt 3/05 dantrolene stopped 3/10 trached  STUDIES:  CT head 2/27 >  No acute intracranial abnormality. Remote bilateral basal ganglia lacunar infarcts. Apparent soft tissue fullness in the nasopharynx could relate to retained secretions, especially given absence of mastoid effusions.  MRI brain 3/3>>>acute left basal ganglia lacunar EEG 3/6 nml  DISCUSSION: 60 yo male presented to [facility] with altered mental status.  He has hx of ETOH and CVA, and reported to smoke crack prior to admission.  Concern  was he had alcohol withdrawal seizure.  Intubated for airway protection. Now w trach     ASSESSMENT / PLAN:  NEUROLOGIC A:   Acute encephalopathy. 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 P:   Monitor mental status Thiamine, folic acid completed   PULMONARY A: Acute respiratory failure in setting of seizures an HCAP. Failure to wean s/p trach (3/10), Apparent angioedema, tongue enlargement >> better Pt with increased trache secretions this week >> less today; CXR with no asp pna seen.   P:   ATC as tolerated. Solumedrol and pepcid given, completed. Diureses. On robinul CXR has been stable. No new infxn seen.  Pt with less trache secretions today than start of the week. On less  TF.  Plan is once pt has less secretions, he can be transferred to LTAC/SNF.    INFECTIOUS A:   CAP with pneumococcus in sputum. GPC on blood cx 1 of 2, ? Contaminant No acute infxn now P:   LP 3/1>>>negative Vancomycin 3/1>>>3/2 Rocephin 3/1>>>3/2>>> 3/12 Amp 3/1>>>3/2 Acyclovir 3/1>>>3/2  Completed course ceftriaxone 3/12 for pneumococcus  Repeated cx's 3/15, note 1 of 2 GPC blood cx. With dropping Pct will defer abx, follow    Disposition >> plan for LTAC/SNF once with less trache secretions  PCCM will sign off for now. Call back if with questions.   Pollie MeyerJ. Angelo A de Dios, MD 07/05/2015, 2:42 PM [facility] Pager [phone] After 3 pm or if no answer, call [phone]

## 2015-07-06 LAB — BASIC METABOLIC PANEL
Anion gap: 13 (ref 5–15)
BUN: 39 mg/dL — AB (ref 6–20)
CHLORIDE: 97 mmol/L — AB (ref 101–111)
CO2: 24 mmol/L (ref 22–32)
CREATININE: 0.8 mg/dL (ref 0.61–1.24)
Calcium: 9.7 mg/dL (ref 8.9–10.3)
GFR calc Af Amer: >60 mL/min (ref >60)
GFR calc non Af Amer: >60 mL/min (ref >60)
GLUCOSE: 140 mg/dL — AB (ref 65–99)
POTASSIUM: 4.7 mmol/L (ref 3.5–5.1)
Sodium: 134 mmol/L — ABNORMAL LOW (ref 135–145)

## 2015-07-06 LAB — GLUCOSE, CAPILLARY
GLUCOSE-CAPILLARY: 220 mg/dL — AB (ref 65–99)
GLUCOSE-CAPILLARY: 168 mg/dL — AB (ref 65–99)
GLUCOSE-CAPILLARY: 167 mg/dL — AB (ref 65–99)
Glucose-Capillary: 137 mg/dL — ABNORMAL HIGH (ref 65–99)
Glucose-Capillary: 146 mg/dL — ABNORMAL HIGH (ref 65–99)
Glucose-Capillary: 156 mg/dL — ABNORMAL HIGH (ref 65–99)

## 2015-07-06 LAB — VITAMIN B1: VITAMIN B1 (THIAMINE): 229.2 nmol/L — AB (ref 66.5–200.0)

## 2015-07-06 LAB — CBC
HEMATOCRIT: 35.4 % — AB (ref 39.0–52.0)
Hemoglobin: 11.6 g/dL — ABNORMAL LOW (ref 13.0–17.0)
MCH: 30.2 pg (ref 26.0–34.0)
MCHC: 32.8 g/dL (ref 30.0–36.0)
MCV: 92.2 fL (ref 78.0–100.0)
Platelets: 383 10*3/uL (ref 150–400)
RBC: 3.84 MIL/uL — ABNORMAL LOW (ref 4.22–5.81)
RDW: 16 % — AB (ref 11.5–15.5)
WBC: 12.4 10*3/uL — ABNORMAL HIGH (ref 4.0–10.5)

## 2015-07-06 LAB — MAGNESIUM: Magnesium: 2.1 mg/dL (ref 1.7–2.4)

## 2015-07-06 MED ORDER — GUAIFENESIN 100 MG/5ML PO SOLN
15.0000 mL | Freq: Four times a day (QID) | ORAL | Status: DC
  Administered 2015-07-06 – 2015-07-09 (×14): 300 mg
  Filled 2015-07-06 (×15): qty 15

## 2015-07-06 MED ORDER — FUROSEMIDE 10 MG/ML IJ SOLN
60.0000 mg | Freq: Two times a day (BID) | INTRAMUSCULAR | Status: DC
  Administered 2015-07-06 – 2015-07-09 (×7): 60 mg via INTRAVENOUS
  Filled 2015-07-06 (×7): qty 6

## 2015-07-06 MED ORDER — POLYETHYLENE GLYCOL 3350 17 G PO PACK
17.0000 g | PACK | Freq: Every day | ORAL | Status: DC
  Administered 2015-07-07: 17 g
  Filled 2015-07-06: qty 1

## 2015-07-06 MED ORDER — GUAIFENESIN 100 MG/5ML PO SOLN: 15.0000 mL | Freq: Four times a day (QID) | ORAL | Status: DC

## 2015-07-06 NOTE — Progress Notes (Signed)
[facility] Progress Note  Patient: Gregory Parker AVW:[id number]   PCP: Isabella Stalling, MD DOB: August 02, 1955   DOA: 06/04/2015   DOS: 07/06/2015   Date of Service: the patient was seen and examined on 07/06/2015  Subjective: Continues to remain nonverbal, not following command, secretions are improving Nutrition: On tube feeding  Brief hospital course: Patient was admitted on 06/04/2015, with complaint of shaking episodes, was found to have status epilepticus, patient was intubated at the [facility] and transferred to [facility]. Patient was initially also started on broad-spectrum antibiotics with CNS coverage, but lumbar puncture was negative. His sputum grew Streptococcus and patient was treated with ceftriaxone for 8 days. Patient also developed rhabdomyolysis which improved with fluids. Initially was on dantrolene for spasticity. After discussing with patient's brother critical care decided to insert tracheostomy as well as PEG tube since the patient was not able to come off the vent. tube feedings were initiated, and the patient was transferred to hospitalist service on 06/30/2015. Currently further plan is continue IV Lasix and await improvement of excess tracheal secretion.  Assessment and Plan: 1. Status epilepticus [facility]) Patient presented with acute encephalopathy most up to secondary to alcohol withdrawal causing seizure. Patient was also suspected to have neuroleptic malignant syndrome during the hospitalization. Currently noncommunicative, not following commands. Difficult to understand patient's baseline. I suspect that the possibility of improvement in his mental status is less likely as the patient may have sustained significant hypoxic brain damage. Initially the patient was given IV Keppra which was discontinued on 06/08/2015 since there was no evidence of seizure and EEG was unremarkable. Currently status post trach and PEG. Monitor clinically, patient  was on fentanyl in the ICU and has been transferred to step down with fentanyl patch, we will gradually reduce the dose.  2. Acute respiratory failure with hypoxia Status post tracheostomy. Pneumococcal pneumonia. Excessive tracheal secretion. Recurrent aspiration  Tracheal culture is negative for any growth even though the Gram stain was positive. No antibiotics Blood cultures are negative performed on 07/03/2015. Increased secretion can also be likely aspiration from tube feeding. Discuss with interventional radiology and at present he feels that the tube is in appropriate position and should be appropriately working. We'll monitor aspiration precautions for the patient. Use scopolamine patch. Steroids has been tapered off Continue IV Lasix at present due to positive net ins and outs, increase Lasix to 60 twice a day  3. Essential hypertension. Continue Cardizem at present. We will avoid combination of Cardizem and amlodipine. Continue Lasix. Continue labetalol as needed.  4. Constipation. Patient did have a bowel movement on 07/02/2015. We will use schedules stool softener.  5. Spasticity with rhabdomyolysis. Peak CK 5000. Improved with IV dantrolene. Currently renal function stable. We'll monitor.  6. Acute left basal ganglia lacunar infarct. Neurology consulted. Continue aspirin Echocardiogram shows ejection fraction 60-65%. CTA head and neck unremarkable.  7. Alcohol abuse. Out of the window for withdrawal at present. Continue to closely monitor  8. Protein calorie malnutrition. Continue tube feeding. Tolerating well at reduced rate  Activity: physical therapy SNF Bowel regimen: last BM 07/02/2015 after enema DVT Prophylaxis: subcutaneous Heparin Nutrition: On tube feeding Advance goals of care discussion: Full code  Procedures: Intubation and tracheostomy insertion, PEG tube insertion EEG to 2/28, 3/06 Echocardiogram Consultants: Critical care primarily  admission, neurology, interventional radiology, Antibiotics: Anti-infectives    Start     Dose/Rate Route Frequency Ordered Stop   07/04/15 1500  levofloxacin (LEVAQUIN) IVPB 750 mg  Status:  Discontinued     750 mg 100 mL/hr over 90 Minutes Intravenous Every 24 hours 07/04/15 1348 07/05/15 1102   06/26/15 1000  ceFAZolin (ANCEF) IVPB 2 g/50 mL premix     2 g 100 mL/hr over 30 Minutes Intravenous To Short Stay 06/26/15 0927 06/26/15 1023   06/26/15 0904  ceFAZolin (ANCEF) 2-3 GM-% IVPB SOLR    Comments:  Dhers, Patricia   : cabinet override      06/26/15 0904 06/26/15 2114   06/25/15 0000  ceFAZolin (ANCEF) IVPB 2 g/50 mL premix     2 g 100 mL/hr over 30 Minutes Intravenous To Radiology 06/23/15 1903 06/25/15 0121   06/10/15 1000  cefTRIAXone (ROCEPHIN) 2 g in dextrose 5 % 50 mL IVPB     2 g 100 mL/hr over 30 Minutes Intravenous Every 12 hours 06/10/15 0958 06/17/15 1011   06/10/15 0945  cefTRIAXone (ROCEPHIN) 1 g in dextrose 5 % 50 mL IVPB  Status:  Discontinued     1 g 100 mL/hr over 30 Minutes Intravenous Every 24 hours 06/10/15 0934 06/10/15 0958   06/07/15 2330  cefTRIAXone (ROCEPHIN) 1 g in dextrose 5 % 50 mL IVPB  Status:  Discontinued     1 g 100 mL/hr over 30 Minutes Intravenous Daily at bedtime 06/07/15 2248 06/10/15 0921   06/07/15 0130  vancomycin (VANCOCIN) 500 mg in sodium chloride 0.9 % 100 mL IVPB  Status:  Discontinued     500 mg 100 mL/hr over 60 Minutes Intravenous Every 12 hours 06/06/15 1228 06/07/15 1123   06/06/15 1330  acyclovir (ZOVIRAX) 550 mg in dextrose 5 % 100 mL IVPB  Status:  Discontinued     10 mg/kg  55 kg 111 mL/hr over 60 Minutes Intravenous Every 8 hours 06/06/15 1228 06/07/15 1123   06/06/15 1330  vancomycin (VANCOCIN) IVPB 1000 mg/200 mL premix     1,000 mg 200 mL/hr over 60 Minutes Intravenous  Once 06/06/15 1228 06/06/15 1625   06/06/15 1330  Ampicillin-Sulbactam (UNASYN) 3 g in sodium chloride 0.9 % 100 mL IVPB  Status:  Discontinued     3  g 100 mL/hr over 60 Minutes Intravenous Every 8 hours 06/06/15 1228 06/07/15 1123   06/06/15 1300  cefTRIAXone (ROCEPHIN) 2 g in dextrose 5 % 50 mL IVPB  Status:  Discontinued     2 g 100 mL/hr over 30 Minutes Intravenous Every 12 hours 06/06/15 1207 06/07/15 1123   06/06/15 1215  cefTRIAXone (ROCEPHIN) 2 g in dextrose 5 % 50 mL IVPB  Status:  Discontinued     2 g 100 mL/hr over 30 Minutes Intravenous Every 24 hours 06/06/15 1200 06/06/15 1206       Family Communication: no family was present at bedside, at the time of interview.   Disposition:  Barriers to safe discharge: Transfer to SNF when secretions are improved   Intake/Output Summary (Last 24 hours) at 07/06/15 1559 Last data filed at 07/06/15 1300  Gross per 24 hour  Intake   1420 ml  Output   1400 ml  Net     20 ml   Filed Weights   07/03/15 0500 07/05/15 0400 07/06/15 0400  Weight: 57.2 kg (126 lb 1.7 oz) 51.3 kg (113 lb 1.5 oz) 50.7 kg (111 lb 12.4 oz)    Objective: Physical Exam: Filed Vitals:   07/06/15 1200 07/06/15 1208 07/06/15 1448 07/06/15 1504  BP: 158/111 158/111  145/93  Pulse: 105 91  103  Temp:  98.3 F (36.8  C)    TempSrc:  Axillary    Resp: 37 20  26  Height:      Weight:      SpO2: 100% 100% 100% 100%    General: Appear in Mild distress, no Rash; Oral Mucosa moist Cardiovascular: S1 and S2 Present, no Murmur,  Respiratory: Bilateral Air entry present and improving Crackles, no wheezes Abdomen: Bowel Sound sluggish, Soft and no tenderness Extremities: no Pedal edema, no calf tenderness  Data Reviewed: CBC:  Recent Labs Lab 06/30/15 0523 07/01/15 0919 07/03/15 0513 07/05/15 0625 07/06/15 0415  WBC 29.1* 19.9* 20.3* 13.7* 12.4*  HGB 9.4* 9.3* 11.1* 12.1* 11.6*  HCT 29.4* 29.2* 34.4* 37.4* 35.4*  MCV 92.2 92.7 92.7 92.3 92.2  PLT 479* 416* 467* 410* 383   Basic Metabolic Panel:  Recent Labs Lab 06/30/15 0523  07/02/15 0329 07/03/15 0513 07/04/15 0530 07/05/15 0625  07/06/15 0415  NA 137  < > 138 136 136 136 134*  K 4.8  < > 4.2 4.3 4.1 4.5 4.7  CL 103  < > 101 99* 99* 96* 97*  CO2 25  < > 25 26 27 25 24   GLUCOSE 156*  < > 135* 134* 127* 153* 140*  BUN 22*  < > 24* 28* 29* 37* 39*  CREATININE 0.67  < > 0.72 0.63 0.70 0.91 0.80  CALCIUM 9.1  < > 9.1 9.8 9.8 9.9 9.7  MG 1.8  --  1.8  --   --  2.0 2.1  PHOS 3.1  --   --   --   --   --   --   < > = values in this interval not displayed. Liver Function Tests:  Recent Labs Lab 07/01/15 0335 07/01/15 0919 07/03/15 0513 07/05/15 0625  AST 56* 47* 66* 69*  ALT 98* 88* 110* 103*  ALKPHOS 73 74 98 106  BILITOT 0.3 0.3 0.6 0.5  PROT 6.8 6.4* 7.7 7.8  ALBUMIN 2.4* 2.3* 2.9* 3.0*   No results for input(s): LIPASE, AMYLASE in the last 168 hours. No results for input(s): AMMONIA in the last 168 hours.  Cardiac Enzymes: No results for input(s): CKTOTAL, CKMB, CKMBINDEX, TROPONINI in the last 168 hours.  BNP (last 3 results) No results for input(s): BNP in the last 8760 hours.  CBG:  Recent Labs Lab 07/05/15 1947 07/06/15 0047 07/06/15 0451 07/06/15 0838 07/06/15 1207  GLUCAP 147* 137* 146* 220* 168*    Recent Results (from the past 240 hour(s))  Culture, respiratory (NON-Expectorated)     Status: None   Collection Time: 07/03/15  7:40 AM  Result Value Ref Range Status   Specimen Description TRACHEAL ASPIRATE  Final   Special Requests NONE  Final   Gram Stain   Final    ABUNDANT WBC PRESENT, PREDOMINANTLY PMN NO SQUAMOUS EPITHELIAL CELLS SEEN MODERATE GRAM POSITIVE COCCI IN PAIRS MODERATE GRAM NEGATIVE RODS FEW GRAM POSITIVE RODS Performed at [facility]    Culture   Final    NORMAL OROPHARYNGEAL FLORA Performed at [facility]    Report Status 07/05/2015 FINAL  Final  Culture, blood (routine x 2)     Status: None (Preliminary result)   Collection Time: 07/03/15  7:45 AM  Result Value Ref Range Status   Specimen Description BLOOD RIGHT ARM  Final   Special  Requests BOTTLES DRAWN AEROBIC AND ANAEROBIC 10CC  Final   Culture NO GROWTH 3 DAYS  Final   Report Status PENDING  Incomplete  Culture,  blood (routine x 2)     Status: None (Preliminary result)   Collection Time: 07/03/15  7:50 AM  Result Value Ref Range Status   Specimen Description BLOOD RIGHT HAND  Final   Special Requests BOTTLES DRAWN AEROBIC AND ANAEROBIC 10CC  Final   Culture NO GROWTH 3 DAYS  Final   Report Status PENDING  Incomplete     Studies: No results found.   Scheduled Meds: . aspirin  81 mg Per Tube Daily  . diltiazem  60 mg Per Tube 4 times per day  . famotidine  20 mg Per Tube BID  . fentaNYL  37.5 mcg Transdermal Q72H  . free water  50 mL Per Tube Q6H  . furosemide  60 mg Intravenous BID  . heparin  5,000 Units Subcutaneous 3 times per day  . insulin aspart  0-9 Units Subcutaneous TID WC  . levalbuterol  0.63 mg Nebulization Q6H  . nystatin  5 mL Oral QID  . [START ON 07/07/2015] polyethylene glycol  17 g Per Tube Daily  . scopolamine  1 patch Transdermal Q72H  . senna-docusate  2 tablet Per Tube BID   Continuous Infusions: . feeding supplement (VITAL 1.5 CAL) 1,000 mL (07/05/15 1343)   PRN Meds: acetaminophen (TYLENOL) oral liquid 160 mg/5 mL, fentaNYL (SUBLIMAZE) injection, labetalol  Time spent: 30 minutes  Author: Lynden Oxford, MD [facility] Pager: [phone] 07/06/2015 3:59 PM  If 7PM-7AM, please contact night-coverage at [email or web address], password [email or web address]

## 2015-07-06 NOTE — Progress Notes (Signed)
SLP reviewed chart as part of trach team. Pt currently has difficulty managing amount of secretions and inconsistently follows commands. Will continue to follow to determine readiness for PMSV evaluation.

## 2015-07-07 LAB — GLUCOSE, CAPILLARY
GLUCOSE-CAPILLARY: 190 mg/dL — AB (ref 65–99)
GLUCOSE-CAPILLARY: 155 mg/dL — AB (ref 65–99)
GLUCOSE-CAPILLARY: 172 mg/dL — AB (ref 65–99)
GLUCOSE-CAPILLARY: 167 mg/dL — AB (ref 65–99)
GLUCOSE-CAPILLARY: 146 mg/dL — AB (ref 65–99)
Glucose-Capillary: 139 mg/dL — ABNORMAL HIGH (ref 65–99)

## 2015-07-07 LAB — HEPATIC FUNCTION PANEL
ALBUMIN: 3.1 g/dL — AB (ref 3.5–5.0)
ALT: 102 U/L — AB (ref 17–63)
AST: 67 U/L — AB (ref 15–41)
Alkaline Phosphatase: 109 U/L (ref 38–126)
Bilirubin, Direct: 0.1 mg/dL (ref 0.1–0.5)
Indirect Bilirubin: 0.5 mg/dL (ref 0.3–0.9)
Total Bilirubin: 0.6 mg/dL (ref 0.3–1.2)
Total Protein: 7.6 g/dL (ref 6.5–8.1)

## 2015-07-07 LAB — CBC
HCT: 35.3 % — ABNORMAL LOW (ref 39.0–52.0)
Hemoglobin: 11.5 g/dL — ABNORMAL LOW (ref 13.0–17.0)
MCH: 30.3 pg (ref 26.0–34.0)
MCHC: 32.6 g/dL (ref 30.0–36.0)
MCV: 92.9 fL (ref 78.0–100.0)
PLATELETS: 388 10*3/uL (ref 150–400)
RBC: 3.8 MIL/uL — AB (ref 4.22–5.81)
RDW: 16.1 % — AB (ref 11.5–15.5)
WBC: 10.8 10*3/uL — AB (ref 4.0–10.5)

## 2015-07-07 NOTE — Progress Notes (Signed)
[facility] Progress Note  Patient: Gregory Parker ZOX:[id number]   PCP: Isabella Stalling, MD DOB: August 01, 1955   DOA: 06/04/2015   DOS: 07/07/2015   Date of Service: the patient was seen and examined on 07/07/2015  Subjective: No significant changes or significant change in the 6 location identified. No other significant change in patient's mental status. No fever no chills. Urine output yesterday has decreased somewhat. Nutrition: On tube feeding  Brief hospital course: Patient was admitted on 06/04/2015, with complaint of shaking episodes, was found to have status epilepticus, patient was intubated at the [facility] and transferred to [facility]. Patient was initially also started on broad-spectrum antibiotics with CNS coverage, but lumbar puncture was negative. His sputum grew Streptococcus and patient was treated with ceftriaxone for 8 days. Patient also developed rhabdomyolysis which improved with fluids. Initially was on dantrolene for spasticity. After discussing with patient's brother critical care decided to insert tracheostomy as well as PEG tube since the patient was not able to come off the vent. tube feedings were initiated, and the patient was transferred to hospitalist service on 06/30/2015. Currently further plan is continue IV Lasix and await improvement of excess tracheal secretion.  Assessment and Plan: 1. Status epilepticus [facility]) Patient presented with acute encephalopathy most up to secondary to alcohol withdrawal causing seizure. Patient was also suspected to have neuroleptic malignant syndrome during the hospitalization. Currently noncommunicative, not following commands. Difficult to understand patient's baseline. I suspect that the possibility of improvement in his mental status is less likely as the patient may have sustained significant hypoxic Gregory damage. Initially the patient was given IV Keppra which was discontinued on 06/08/2015 since there  was no evidence of seizure and EEG was unremarkable. Currently status post trach and PEG. patient was on fentanyl in the ICU and has been transferred to step down with fentanyl patch, we will gradually reduce the dose. currently 37.5 g. With persistent tachycardia as suspected with difficult to reduce dose further present.  2. Acute respiratory failure with hypoxia Status post tracheostomy. Pneumococcal pneumonia. Excessive tracheal secretion. Recurrent aspiration  Tracheal culture is negative for any growth even though the Gram stain was positive. No antibiotics Blood cultures are negative performed on 07/03/2015. Increased secretion can also be likely aspiration from tube feeding. Discuss with interventional radiology and at present he feels that the tube is in appropriate position and should be appropriately working. We'll monitor aspiration precautions for the patient. Use scopolamine patch. Steroids has been tapered off Continue IV Lasix at present 60 milligrams  twice a day  3. Essential hypertension. Continue Cardizem at present.Continue Lasix. Continue labetalol as needed.  4. Constipation. Currently resolved  Continue scheduled stool softener.  5. Spasticity with rhabdomyolysis. Peak CK 5000. Improved with IV dantrolene. Currently renal function stable. We'll monitor.  6. Acute left basal ganglia lacunar infarct. Neurology consulted. Continue aspirin Echocardiogram shows ejection fraction 60-65%. CTA head and neck unremarkable.  7. Alcohol abuse. Out of the window for withdrawal at present. Continue to closely monitor  8. Protein calorie malnutrition. Continue tube feeding. Tolerating well at reduced rate  Activity: physical therapy SNF Bowel regimen: last BM 07/02/2015 after enema DVT Prophylaxis: subcutaneous Heparin Nutrition: On tube feeding Advance goals of care discussion: Full code  Procedures: Intubation and tracheostomy insertion, PEG tube  insertion EEG to 2/28, 3/06 Echocardiogram Consultants: Critical care primarily admission, neurology, interventional radiology, Antibiotics: Anti-infectives    Start     Dose/Rate Route Frequency Ordered Stop   07/04/15 1500  levofloxacin (LEVAQUIN) IVPB 750 mg  Status:  Discontinued     750 mg 100 mL/hr over 90 Minutes Intravenous Every 24 hours 07/04/15 1348 07/05/15 1102   06/26/15 1000  ceFAZolin (ANCEF) IVPB 2 g/50 mL premix     2 g 100 mL/hr over 30 Minutes Intravenous To Short Stay 06/26/15 0927 06/26/15 1023   06/26/15 0904  ceFAZolin (ANCEF) 2-3 GM-% IVPB SOLR    Comments:  Dhers, Patricia   : cabinet override      06/26/15 0904 06/26/15 2114   06/25/15 0000  ceFAZolin (ANCEF) IVPB 2 g/50 mL premix     2 g 100 mL/hr over 30 Minutes Intravenous To Radiology 06/23/15 1903 06/25/15 0121   06/10/15 1000  cefTRIAXone (ROCEPHIN) 2 g in dextrose 5 % 50 mL IVPB     2 g 100 mL/hr over 30 Minutes Intravenous Every 12 hours 06/10/15 0958 06/17/15 1011   06/10/15 0945  cefTRIAXone (ROCEPHIN) 1 g in dextrose 5 % 50 mL IVPB  Status:  Discontinued     1 g 100 mL/hr over 30 Minutes Intravenous Every 24 hours 06/10/15 0934 06/10/15 0958   06/07/15 2330  cefTRIAXone (ROCEPHIN) 1 g in dextrose 5 % 50 mL IVPB  Status:  Discontinued     1 g 100 mL/hr over 30 Minutes Intravenous Daily at bedtime 06/07/15 2248 06/10/15 0921   06/07/15 0130  vancomycin (VANCOCIN) 500 mg in sodium chloride 0.9 % 100 mL IVPB  Status:  Discontinued     500 mg 100 mL/hr over 60 Minutes Intravenous Every 12 hours 06/06/15 1228 06/07/15 1123   06/06/15 1330  acyclovir (ZOVIRAX) 550 mg in dextrose 5 % 100 mL IVPB  Status:  Discontinued     10 mg/kg  55 kg 111 mL/hr over 60 Minutes Intravenous Every 8 hours 06/06/15 1228 06/07/15 1123   06/06/15 1330  vancomycin (VANCOCIN) IVPB 1000 mg/200 mL premix     1,000 mg 200 mL/hr over 60 Minutes Intravenous  Once 06/06/15 1228 06/06/15 1625   06/06/15 1330   Ampicillin-Sulbactam (UNASYN) 3 g in sodium chloride 0.9 % 100 mL IVPB  Status:  Discontinued     3 g 100 mL/hr over 60 Minutes Intravenous Every 8 hours 06/06/15 1228 06/07/15 1123   06/06/15 1300  cefTRIAXone (ROCEPHIN) 2 g in dextrose 5 % 50 mL IVPB  Status:  Discontinued     2 g 100 mL/hr over 30 Minutes Intravenous Every 12 hours 06/06/15 1207 06/07/15 1123   06/06/15 1215  cefTRIAXone (ROCEPHIN) 2 g in dextrose 5 % 50 mL IVPB  Status:  Discontinued     2 g 100 mL/hr over 30 Minutes Intravenous Every 24 hours 06/06/15 1200 06/06/15 1206      Family Communication: no family was present at bedside, at the time of interview.   Disposition:  Barriers to safe discharge: Transfer to SNF when secretions are improved   Intake/Output Summary (Last 24 hours) at 07/07/15 1611 Last data filed at 07/07/15 0600  Gross per 24 hour  Intake    770 ml  Output    500 ml  Net    270 ml   Filed Weights   07/05/15 0400 07/06/15 0400 07/07/15 0500  Weight: 51.3 kg (113 lb 1.5 oz) 50.7 kg (111 lb 12.4 oz) 48.7 kg (107 lb 5.8 oz)    Objective: Physical Exam: Filed Vitals:   07/07/15 1400 07/07/15 1451 07/07/15 1500 07/07/15 1600  BP: 134/90  132/93 144/86  Pulse: 100  101 104  Temp:      TempSrc:      Resp: 30  34 35  Height:      Weight:      SpO2: 99% 100% 100% 98%    General: Appear in no distress, no Rash; patient not allowing complete examination, and blocking with his hand. Cardiovascular: S1 and S2 Present, no Murmur,  Respiratory: Bilateral Air entry present and improving Crackles, no wheezes Abdomen: Bowel Sound sluggish, Soft and no tenderness Extremities: no Pedal edema, no calf tenderness  Data Reviewed: CBC:  Recent Labs Lab 07/01/15 0919 07/03/15 0513 07/05/15 0625 07/06/15 0415 07/07/15 0658  WBC 19.9* 20.3* 13.7* 12.4* 10.8*  HGB 9.3* 11.1* 12.1* 11.6* 11.5*  HCT 29.2* 34.4* 37.4* 35.4* 35.3*  MCV 92.7 92.7 92.3 92.2 92.9  PLT 416* 467* 410* 383 388   Basic  Metabolic Panel:  Recent Labs Lab 07/02/15 0329 07/03/15 0513 07/04/15 0530 07/05/15 0625 07/06/15 0415  NA 138 136 136 136 134*  K 4.2 4.3 4.1 4.5 4.7  CL 101 99* 99* 96* 97*  CO2 25 26 27 25 24   GLUCOSE 135* 134* 127* 153* 140*  BUN 24* 28* 29* 37* 39*  CREATININE 0.72 0.63 0.70 0.91 0.80  CALCIUM 9.1 9.8 9.8 9.9 9.7  MG 1.8  --   --  2.0 2.1   Liver Function Tests:  Recent Labs Lab 07/01/15 0335 07/01/15 0919 07/03/15 0513 07/05/15 0625 07/07/15 0658  AST 56* 47* 66* 69* 67*  ALT 98* 88* 110* 103* 102*  ALKPHOS 73 74 98 106 109  BILITOT 0.3 0.3 0.6 0.5 0.6  PROT 6.8 6.4* 7.7 7.8 7.6  ALBUMIN 2.4* 2.3* 2.9* 3.0* 3.1*   No results for input(s): LIPASE, AMYLASE in the last 168 hours. No results for input(s): AMMONIA in the last 168 hours.  Cardiac Enzymes: No results for input(s): CKTOTAL, CKMB, CKMBINDEX, TROPONINI in the last 168 hours.  BNP (last 3 results) No results for input(s): BNP in the last 8760 hours.  CBG:  Recent Labs Lab 07/06/15 2124 07/07/15 0041 07/07/15 0417 07/07/15 0741 07/07/15 1253  GLUCAP 167* 139* 190* 155* 172*    Recent Results (from the past 240 hour(s))  Culture, respiratory (NON-Expectorated)     Status: None   Collection Time: 07/03/15  7:40 AM  Result Value Ref Range Status   Specimen Description TRACHEAL ASPIRATE  Final   Special Requests NONE  Final   Gram Stain   Final    ABUNDANT WBC PRESENT, PREDOMINANTLY PMN NO SQUAMOUS EPITHELIAL CELLS SEEN MODERATE GRAM POSITIVE COCCI IN PAIRS MODERATE GRAM NEGATIVE RODS FEW GRAM POSITIVE RODS Performed at [facility]    Culture   Final    NORMAL OROPHARYNGEAL FLORA Performed at [facility]    Report Status 07/05/2015 FINAL  Final  Culture, blood (routine x 2)     Status: None (Preliminary result)   Collection Time: 07/03/15  7:45 AM  Result Value Ref Range Status   Specimen Description BLOOD RIGHT ARM  Final   Special Requests BOTTLES DRAWN AEROBIC  AND ANAEROBIC 10CC  Final   Culture NO GROWTH 4 DAYS  Final   Report Status PENDING  Incomplete  Culture, blood (routine x 2)     Status: None (Preliminary result)   Collection Time: 07/03/15  7:50 AM  Result Value Ref Range Status   Specimen Description BLOOD RIGHT HAND  Final   Special Requests BOTTLES DRAWN AEROBIC AND ANAEROBIC 10CC  Final  Culture NO GROWTH 4 DAYS  Final   Report Status PENDING  Incomplete     Studies: No results found.   Scheduled Meds: . aspirin  81 mg Per Tube Daily  . diltiazem  60 mg Per Tube 4 times per day  . famotidine  20 mg Per Tube BID  . fentaNYL  37.5 mcg Transdermal Q72H  . free water  50 mL Per Tube Q6H  . furosemide  60 mg Intravenous BID  . guaiFENesin  15 mL Per Tube QID  . heparin  5,000 Units Subcutaneous 3 times per day  . insulin aspart  0-9 Units Subcutaneous TID WC  . levalbuterol  0.63 mg Nebulization Q6H  . nystatin  5 mL Oral QID  . polyethylene glycol  17 g Per Tube Daily  . scopolamine  1 patch Transdermal Q72H  . senna-docusate  2 tablet Per Tube BID   Continuous Infusions: . feeding supplement (VITAL 1.5 CAL) 1,000 mL (07/07/15 0550)   PRN Meds: acetaminophen (TYLENOL) oral liquid 160 mg/5 mL, fentaNYL (SUBLIMAZE) injection, labetalol  Time spent: 30 minutes  Author: Lynden Oxford, MD [facility] Pager: [phone] 07/07/2015 4:11 PM  If 7PM-7AM, please contact night-coverage at [email or web address], password [facility]

## 2015-07-08 LAB — CULTURE, BLOOD (ROUTINE X 2)
Culture: NO GROWTH
Culture: NO GROWTH

## 2015-07-08 LAB — CBC
HEMATOCRIT: 36.8 % — AB (ref 39.0–52.0)
HEMOGLOBIN: 11.8 g/dL — AB (ref 13.0–17.0)
MCH: 29.9 pg (ref 26.0–34.0)
MCHC: 32.1 g/dL (ref 30.0–36.0)
MCV: 93.2 fL (ref 78.0–100.0)
Platelets: 371 10*3/uL (ref 150–400)
RBC: 3.95 MIL/uL — ABNORMAL LOW (ref 4.22–5.81)
RDW: 15.9 % — ABNORMAL HIGH (ref 11.5–15.5)
WBC: 10.8 10*3/uL — AB (ref 4.0–10.5)

## 2015-07-08 LAB — RENAL FUNCTION PANEL
ALBUMIN: 3.2 g/dL — AB (ref 3.5–5.0)
Anion gap: 12 (ref 5–15)
BUN: 43 mg/dL — AB (ref 6–20)
CHLORIDE: 97 mmol/L — AB (ref 101–111)
CO2: 28 mmol/L (ref 22–32)
CREATININE: 0.79 mg/dL (ref 0.61–1.24)
Calcium: 9.7 mg/dL (ref 8.9–10.3)
Glucose, Bld: 169 mg/dL — ABNORMAL HIGH (ref 65–99)
PHOSPHORUS: 4.8 mg/dL — AB (ref 2.5–4.6)
POTASSIUM: 4.1 mmol/L (ref 3.5–5.1)
Sodium: 137 mmol/L (ref 135–145)

## 2015-07-08 LAB — GLUCOSE, CAPILLARY
GLUCOSE-CAPILLARY: 160 mg/dL — AB (ref 65–99)
GLUCOSE-CAPILLARY: 162 mg/dL — AB (ref 65–99)
GLUCOSE-CAPILLARY: 164 mg/dL — AB (ref 65–99)
GLUCOSE-CAPILLARY: 161 mg/dL — AB (ref 65–99)
Glucose-Capillary: 144 mg/dL — ABNORMAL HIGH (ref 65–99)
Glucose-Capillary: 156 mg/dL — ABNORMAL HIGH (ref 65–99)

## 2015-07-08 MED ORDER — POLYETHYLENE GLYCOL 3350 17 G PO PACK: 17.0000 g | PACK | Freq: Every day | ORAL | Status: DC | PRN

## 2015-07-08 MED ORDER — SENNOSIDES-DOCUSATE SODIUM 8.6-50 MG PO TABS: 2.0000 | ORAL_TABLET | Freq: Every evening | ORAL | Status: DC | PRN

## 2015-07-08 NOTE — Progress Notes (Signed)
[facility] Progress Note  Patient: Gregory Parker WUJ:[id number]   PCP: Isabella Stalling, MD DOB: [birth year]   DOA: 06/04/2015   DOS: 07/08/2015   Date of Service: the patient was seen and examined on 07/08/2015  Subjective: Urine output improved after increasing the dose of the Lasix. The patient tolerating lower reactive to field. Secretions continues to remain present but improving Nutrition: On tube feeding  Brief hospital course: Patient was admitted on 06/04/2015, with complaint of shaking episodes, was found to have status epilepticus, patient was intubated at the [facility] and transferred to [facility]. Patient was initially also started on broad-spectrum antibiotics with CNS coverage, but lumbar puncture was negative. His sputum grew Streptococcus and patient was treated with ceftriaxone for 8 days. Patient also developed rhabdomyolysis which improved with fluids. Initially was on dantrolene for spasticity. After discussing with patient's brother critical care decided to insert tracheostomy as well as PEG tube since the patient was not able to come off the vent. tube feedings were initiated, and the patient was transferred to hospitalist service on 06/30/2015. Currently further plan is continue IV Lasix and await improvement of excess tracheal secretion.  Assessment and Plan: 1. Status epilepticus [facility]) Patient presented with acute encephalopathy most up to secondary to alcohol withdrawal causing seizure. Patient was also suspected to have neuroleptic malignant syndrome during the hospitalization. Continues to remain noncommunicative, not following commands. Difficult to understand patient's baseline. I suspect that the possibility of improvement in his mental status is less likely as the patient may have sustained significant hypoxic brain damage during the initial event. Initially the patient was given IV Keppra which was discontinued on 06/08/2015 since there was  no evidence of seizure and EEG was unremarkable. Currently status post trach and PEG. patient was on fentanyl in the ICU and has been transferred to step down with fentanyl patch, we will gradually reduce the dose. currently 37.5 g. With persistent tachycardia as suspected with difficult to reduce dose further present.  2. Acute respiratory failure with hypoxia Status post tracheostomy. Pneumococcal pneumonia. Excessive tracheal secretion. Recurrent aspiration  Tracheal culture is negative for any growth even though the Gram stain was positive. No antibiotics Blood cultures are negative performed on 07/03/2015. Increased secretion can also be likely aspiration from tube feeding. Interventional radiology does not feel to the PEG tube needs to be repositioned We'll monitor aspiration precautions for the patient. Use scopolamine patch. Steroids has been tapered off Continue IV Lasix at present 60 milligrams  twice a day  3. Essential hypertension. Continue Cardizem at present.Continue Lasix. Continue labetalol as needed.  4. Constipation. Currently resolved  Changing Senokot as needed continue MiraLAX scheduled  5. Spasticity with rhabdomyolysis. Peak CK 5000. Improved with IV dantrolene. Currently renal function stable. We'll monitor.  6. Acute left basal ganglia lacunar infarct. Neurology consulted. Continue aspirin Echocardiogram shows ejection fraction 60-65%. CTA head and neck unremarkable.  7. Alcohol abuse. Out of the window for withdrawal at present. Continue to closely monitor  8. Severe Protein calorie malnutrition. Continue tube feeding. Tolerating well at reduced rate  Activity: physical therapy SNF Bowel regimen: last BM 07/07/2015 DVT Prophylaxis: subcutaneous Heparin Nutrition: On tube feeding Advance goals of care discussion: Full code  Procedures: Intubation and tracheostomy insertion, PEG tube insertion EEG to 2/28,  3/06 Echocardiogram Consultants: Critical care primarily admission, neurology, interventional radiology, Antibiotics: Anti-infectives    Start     Dose/Rate Route Frequency Ordered Stop   07/04/15 1500  levofloxacin (LEVAQUIN) IVPB 750  mg  Status:  Discontinued     750 mg 100 mL/hr over 90 Minutes Intravenous Every 24 hours 07/04/15 1348 07/05/15 1102   06/26/15 1000  ceFAZolin (ANCEF) IVPB 2 g/50 mL premix     2 g 100 mL/hr over 30 Minutes Intravenous To Short Stay 06/26/15 0927 06/26/15 1023   06/26/15 0904  ceFAZolin (ANCEF) 2-3 GM-% IVPB SOLR    Comments:  Dhers, Patricia   : cabinet override      06/26/15 0904 06/26/15 2114   06/25/15 0000  ceFAZolin (ANCEF) IVPB 2 g/50 mL premix     2 g 100 mL/hr over 30 Minutes Intravenous To Radiology 06/23/15 1903 06/25/15 0121   06/10/15 1000  cefTRIAXone (ROCEPHIN) 2 g in dextrose 5 % 50 mL IVPB     2 g 100 mL/hr over 30 Minutes Intravenous Every 12 hours 06/10/15 0958 06/17/15 1011   06/10/15 0945  cefTRIAXone (ROCEPHIN) 1 g in dextrose 5 % 50 mL IVPB  Status:  Discontinued     1 g 100 mL/hr over 30 Minutes Intravenous Every 24 hours 06/10/15 0934 06/10/15 0958   06/07/15 2330  cefTRIAXone (ROCEPHIN) 1 g in dextrose 5 % 50 mL IVPB  Status:  Discontinued     1 g 100 mL/hr over 30 Minutes Intravenous Daily at bedtime 06/07/15 2248 06/10/15 0921   06/07/15 0130  vancomycin (VANCOCIN) 500 mg in sodium chloride 0.9 % 100 mL IVPB  Status:  Discontinued     500 mg 100 mL/hr over 60 Minutes Intravenous Every 12 hours 06/06/15 1228 06/07/15 1123   06/06/15 1330  acyclovir (ZOVIRAX) 550 mg in dextrose 5 % 100 mL IVPB  Status:  Discontinued     10 mg/kg  55 kg 111 mL/hr over 60 Minutes Intravenous Every 8 hours 06/06/15 1228 06/07/15 1123   06/06/15 1330  vancomycin (VANCOCIN) IVPB 1000 mg/200 mL premix     1,000 mg 200 mL/hr over 60 Minutes Intravenous  Once 06/06/15 1228 06/06/15 1625   06/06/15 1330  Ampicillin-Sulbactam (UNASYN) 3 g in  sodium chloride 0.9 % 100 mL IVPB  Status:  Discontinued     3 g 100 mL/hr over 60 Minutes Intravenous Every 8 hours 06/06/15 1228 06/07/15 1123   06/06/15 1300  cefTRIAXone (ROCEPHIN) 2 g in dextrose 5 % 50 mL IVPB  Status:  Discontinued     2 g 100 mL/hr over 30 Minutes Intravenous Every 12 hours 06/06/15 1207 06/07/15 1123   06/06/15 1215  cefTRIAXone (ROCEPHIN) 2 g in dextrose 5 % 50 mL IVPB  Status:  Discontinued     2 g 100 mL/hr over 30 Minutes Intravenous Every 24 hours 06/06/15 1200 06/06/15 1206      Family Communication: no family was present at bedside, at the time of interview.   Disposition:  Barriers to safe discharge: Transfer to SNF when secretions are improved   Intake/Output Summary (Last 24 hours) at 07/08/15 0947 Last data filed at 07/08/15 0800  Gross per 24 hour  Intake   1525 ml  Output   1400 ml  Net    125 ml   Filed Weights   07/06/15 0400 07/07/15 0500 07/08/15 0500  Weight: 50.7 kg (111 lb 12.4 oz) 48.7 kg (107 lb 5.8 oz) 49.8 kg (109 lb 12.6 oz)   Objective: Physical Exam: Filed Vitals:   07/08/15 0800 07/08/15 0837 07/08/15 0838 07/08/15 0900  BP:    108/76  Pulse:    97  Temp: 99.7 F (37.6  C)     TempSrc: Axillary     Resp:    23  Height:      Weight:      SpO2:  100% 100% 95%    General: Appear in no distress, no Rash; patient not allowing complete examination, and blocking with his hand. Cardiovascular: S1 and S2 Present, no Murmur,  Respiratory: Bilateral Air entry present and improving Crackles, no wheezes Abdomen: Bowel Sound sluggish, Soft and no tenderness Extremities: no Pedal edema, no calf tenderness  Data Reviewed: CBC:  Recent Labs Lab 07/03/15 0513 07/05/15 0625 07/06/15 0415 07/07/15 0658 07/08/15 0255  WBC 20.3* 13.7* 12.4* 10.8* 10.8*  HGB 11.1* 12.1* 11.6* 11.5* 11.8*  HCT 34.4* 37.4* 35.4* 35.3* 36.8*  MCV 92.7 92.3 92.2 92.9 93.2  PLT 467* 410* 383 388 371   Basic Metabolic Panel:  Recent Labs Lab  07/02/15 0329 07/03/15 0513 07/04/15 0530 07/05/15 0625 07/06/15 0415 07/08/15 0255  NA 138 136 136 136 134* 137  K 4.2 4.3 4.1 4.5 4.7 4.1  CL 101 99* 99* 96* 97* 97*  CO2 GLUCOSE 135* 134* 127* 153* 140* 169*  BUN 24* 28* 29* 37* 39* 43*  CREATININE 0.72 0.63 0.70 0.91 0.80 0.79  CALCIUM 9.1 9.8 9.8 9.9 9.7 9.7  MG 1.8  --   --  2.0 2.1  --   PHOS  --   --   --   --   --  4.8*   Liver Function Tests:  Recent Labs Lab 07/03/15 0513 07/05/15 0625 07/07/15 0658 07/08/15 0255  AST 66* 69* 67*  --   ALT 110* 103* 102*  --   ALKPHOS 98 106 109  --   BILITOT 0.6 0.5 0.6  --   PROT 7.7 7.8 7.6  --   ALBUMIN 2.9* 3.0* 3.1* 3.2*   No results for input(s): LIPASE, AMYLASE in the last 168 hours. No results for input(s): AMMONIA in the last 168 hours.  Cardiac Enzymes: No results for input(s): CKTOTAL, CKMB, CKMBINDEX, TROPONINI in the last 168 hours.  BNP (last 3 results) No results for input(s): BNP in the last 8760 hours.  CBG:  Recent Labs Lab 07/07/15 1744 07/07/15 2054 07/08/15 0031 07/08/15 0450 07/08/15 0727  GLUCAP 167* 146* 156* 160* 162*    Recent Results (from the past 240 hour(s))  Culture, respiratory (NON-Expectorated)     Status: None   Collection Time: 07/03/15  7:40 AM  Result Value Ref Range Status   Specimen Description TRACHEAL ASPIRATE  Final   Special Requests NONE  Final   Gram Stain   Final    ABUNDANT WBC PRESENT, PREDOMINANTLY PMN NO SQUAMOUS EPITHELIAL CELLS SEEN MODERATE GRAM POSITIVE COCCI IN PAIRS MODERATE GRAM NEGATIVE RODS FEW GRAM POSITIVE RODS Performed at [facility]    Culture   Final    NORMAL OROPHARYNGEAL FLORA Performed at [facility]    Report Status 07/05/2015 FINAL  Final  Culture, blood (routine x 2)     Status: None (Preliminary result)   Collection Time: 07/03/15  7:45 AM  Result Value Ref Range Status   Specimen Description BLOOD RIGHT ARM  Final   Special Requests  BOTTLES DRAWN AEROBIC AND ANAEROBIC 10CC  Final   Culture NO GROWTH 4 DAYS  Final   Report Status PENDING  Incomplete  Culture, blood (routine x 2)     Status: None (Preliminary result)   Collection Time: 07/03/15  7:50  AM  Result Value Ref Range Status   Specimen Description BLOOD RIGHT HAND  Final   Special Requests BOTTLES DRAWN AEROBIC AND ANAEROBIC 10CC  Final   Culture NO GROWTH 4 DAYS  Final   Report Status PENDING  Incomplete     Studies: No results found.   Scheduled Meds: . aspirin  81 mg Per Tube Daily  . diltiazem  60 mg Per Tube 4 times per day  . famotidine  20 mg Per Tube BID  . fentaNYL  37.5 mcg Transdermal Q72H  . free water  50 mL Per Tube Q6H  . furosemide  60 mg Intravenous BID  . guaiFENesin  15 mL Per Tube QID  . heparin  5,000 Units Subcutaneous 3 times per day  . insulin aspart  0-9 Units Subcutaneous TID WC  . levalbuterol  0.63 mg Nebulization Q6H  . nystatin  5 mL Oral QID  . polyethylene glycol  17 g Per Tube Daily  . scopolamine  1 patch Transdermal Q72H  . senna-docusate  2 tablet Per Tube BID   Continuous Infusions: . feeding supplement (VITAL 1.5 CAL) 1,000 mL (07/08/15 0800)   PRN Meds: acetaminophen (TYLENOL) oral liquid 160 mg/5 mL, fentaNYL (SUBLIMAZE) injection, labetalol  Time spent: 30 minutes  Author: Lynden Oxford, MD [facility] Pager: [phone] 07/08/2015 9:47 AM  If 7PM-7AM, please contact night-coverage at [email or web address], password [facility]

## 2015-07-08 NOTE — Plan of Care (Signed)
Problem: Health Behavior/Discharge Planning: Goal: Ability to manage health-related needs will improve Outcome: Progressing Trache care being done every 4 hours by RT and nursing at this time

## 2015-07-08 NOTE — Progress Notes (Signed)
Suctioned patient 4 times this shift.

## 2015-07-09 DIAGNOSIS — I5033 Acute on chronic diastolic (congestive) heart failure: Secondary | ICD-10-CM | POA: Diagnosis present

## 2015-07-09 LAB — GLUCOSE, CAPILLARY
GLUCOSE-CAPILLARY: 177 mg/dL — AB (ref 65–99)
Glucose-Capillary: 127 mg/dL — ABNORMAL HIGH (ref 65–99)
Glucose-Capillary: 134 mg/dL — ABNORMAL HIGH (ref 65–99)
Glucose-Capillary: 163 mg/dL — ABNORMAL HIGH (ref 65–99)
Glucose-Capillary: 166 mg/dL — ABNORMAL HIGH (ref 65–99)

## 2015-07-09 LAB — CBC
HCT: 34.9 % — ABNORMAL LOW (ref 39.0–52.0)
Hemoglobin: 11.5 g/dL — ABNORMAL LOW (ref 13.0–17.0)
MCH: 30.7 pg (ref 26.0–34.0)
MCHC: 33 g/dL (ref 30.0–36.0)
MCV: 93.3 fL (ref 78.0–100.0)
PLATELETS: 328 10*3/uL (ref 150–400)
RBC: 3.74 MIL/uL — AB (ref 4.22–5.81)
RDW: 16.1 % — AB (ref 11.5–15.5)
WBC: 10.4 10*3/uL (ref 4.0–10.5)

## 2015-07-09 LAB — RENAL FUNCTION PANEL
Albumin: 3.1 g/dL — ABNORMAL LOW (ref 3.5–5.0)
Anion gap: 11 (ref 5–15)
BUN: 43 mg/dL — AB (ref 6–20)
CALCIUM: 9.7 mg/dL (ref 8.9–10.3)
CHLORIDE: 96 mmol/L — AB (ref 101–111)
CO2: 29 mmol/L (ref 22–32)
CREATININE: 0.75 mg/dL (ref 0.61–1.24)
GFR calc Af Amer: >60 mL/min (ref >60)
GFR calc non Af Amer: >60 mL/min (ref >60)
GLUCOSE: 182 mg/dL — AB (ref 65–99)
PHOSPHORUS: 4.3 mg/dL (ref 2.5–4.6)
Potassium: 4 mmol/L (ref 3.5–5.1)
SODIUM: 136 mmol/L (ref 135–145)

## 2015-07-09 MED ORDER — FENTANYL 25 MCG/HR TD PT72
25.0000 ug | MEDICATED_PATCH | TRANSDERMAL | Status: DC
  Administered 2015-07-09 – 2015-07-12 (×2): 25 ug via TRANSDERMAL
  Filled 2015-07-09 (×2): qty 1

## 2015-07-09 MED ORDER — FREE WATER
50.0000 mL | Freq: Three times a day (TID) | Status: DC
  Administered 2015-07-09 – 2015-07-12 (×9): 50 mL

## 2015-07-09 NOTE — Progress Notes (Signed)
Patient suctioned only 3 times this shift. Respiratory suctioning on rounds, as needed. RN staff avoiding suctioning unless need is great, as patient has been able to cough up some secretions on his own. Output 100ML of suctioned secretions over shift.  Noe GensStefanie A Maddox Bratcher, RN

## 2015-07-09 NOTE — Progress Notes (Signed)
Nutrition Consult/Follow Up  DOCUMENTATION CODES:   Severe malnutrition in context of chronic illness  INTERVENTION:    Continue Vital 1.5 formula at goal rate of 55 ml/hr via G-tube to provide 1980 kcals, 89 gm protein, 1008 ml of free water daily  NUTRITION DIAGNOSIS:   Malnutrition related to chronic illness as evidenced by severe depletion of body fat, severe depletion of muscle mass, ongoing  GOAL:   Patient will meet greater than or equal to 90% of their needs, met  MONITOR:   TF tolerance, I & O's, Weight trends  ASSESSMENT:   Pt with etoh and drug abuse history presenting with seizures and acute CVA.   Patient s/p procedure 3/21: IR GASTROSTOMY TUBE MOD SED   Patient continues on trach collar.   Vital 1.5 formula currently infusing at 55 ml/hr via G-tube providing 1980 kcals, 89 gm protein, 1008 ml of free water daily.  Tolerating well.  Per RN, trach secretions are better.  Diet Order:  Diet NPO time specified Except for: Sips with Meds  Skin:  Reviewed, no issues  Last BM:  4/2  Height:   Ht Readings from Last 1 Encounters:  06/23/15 _0  (1.651 m)    Weight:   Wt Readings from Last 1 Encounters:  07/09/15 110 lb 0.2 oz (49.9 kg)    Ideal Body Weight:  59 kg  BMI:  Body mass index is 18.31 kg/(m^2).  Estimated Nutritional Needs:   Kcal:  1900-2100  Protein:  95-120 grams  Fluid:  > 1.6 L/day  EDUCATION NEEDS:   No education needs identified at this time  Arthur Holms, RD, LDN Pager #: [phone] After-Hours Pager #: [phone]

## 2015-07-09 NOTE — NC FL2 (Signed)
[place] MEDICAID FL2 LEVEL OF CARE SCREENING TOOL     IDENTIFICATION  Patient Name: Gregory Parker Birthdate: Mar 20, 1956 Sex: male Admission Date (Current Location): 06/04/2015  [facility] and [place] Number:  [employer] and Address:  [facility], [address] [place], [place] [address]      Provider Number:    Attending Physician Name and Address:  Rolly Salter, MD  Relative Name and Phone Number:   Lindbergh Winkles [phone]))    Current Level of Care: Hospital Recommended Level of Care: Skilled Nursing Facility Prior Approval Number:    Date Approved/Denied:   PASRR Number: [id number]  Discharge Plan: SNF    Current Diagnoses: Patient Active Problem List   Diagnosis Date Noted  . Diastolic dysfunction with acute on chronic heart failure (HCC) 07/09/2015  . Dysphagia   . Constipation   . SEVERE Protein calorie malnutrition (HCC)   . Tracheostomy care (HCC)   . PEG (percutaneous endoscopic gastrostomy) status (HCC)   . Acute respiratory failure with hypoxemia (HCC)   . Stroke (HCC)   . Pneumonia   . Acute encephalopathy   . Cerebral thrombosis with cerebral infarction 06/05/2015  . Coma (HCC)   . Alcohol withdrawal (HCC)   . Acute respiratory failure (HCC) 06/04/2015  . Seizures (HCC) 06/04/2015  . Encounter for intubation   . Status epilepticus (HCC)   . Alcohol abuse   . Malignant hypertension     Orientation RESPIRATION BLADDER Height & Weight        Tracheostomy (Shiley 6mm cuffed, FiO2 28%) Continent Weight: 110 lb 0.2 oz (49.9 kg) Height:   (165.1 cm)  BEHAVIORAL SYMPTOMS/MOOD NEUROLOGICAL BOWEL NUTRITION STATUS     (Sedated, non-responsive. ) Continent Feeding tube  AMBULATORY STATUS COMMUNICATION OF NEEDS Skin   Total Care Non-Verbally Normal (Intact, warm, no rashes.)                       Personal Care Assistance Level of Assistance  Bathing, Dressing, Feeding Bathing Assistance:  Maximum assistance Feeding assistance: Maximum assistance Dressing Assistance: Maximum assistance     Functional Limitations Info  Sight, Hearing, Speech Sight Info: Adequate Hearing Info: Adequate Speech Info: Impaired    SPECIAL CARE FACTORS FREQUENCY  PT (By licensed PT), OT (By licensed OT)                    Contractures      Additional Factors Info  Code Status, Allergies, Suctioning Needs Code Status Info: full Allergies Info: NKA       Suctioning Needs: every 3-4 hours   Current Medications (07/09/2015):  This is the current hospital active medication list Current Facility-Administered Medications  Medication Dose Route Frequency Provider Last Rate Last Dose  . acetaminophen (TYLENOL) solution 650 mg  650 mg Per Tube Q6H PRN Marvel Plan, MD   650 mg at 07/07/15 2116  . aspirin chewable tablet 81 mg  81 mg Per Tube Daily Albertine Grates, MD   81 mg at 07/09/15 0903  . diltiazem (CARDIZEM) 10 mg/ml oral suspension 60 mg  60 mg Per Tube 4 times per day Oretha Milch, MD   60 mg at 07/09/15 1211  . famotidine (PEPCID) 40 MG/5ML suspension 20 mg  20 mg Per Tube BID Albertine Grates, MD   20 mg at 07/09/15 0904  . feeding supplement (VITAL 1.5 CAL) liquid 1,000 mL  1,000 mL Per Tube Continuous Zachery Conch  Allena KatzPatel, MD 55 mL/hr at 07/08/15 1938 1,000 mL at 07/08/15 1938  . fentaNYL (DURAGESIC - dosed mcg/hr) patch 25 mcg  25 mcg Transdermal Q72H Rolly SalterPranav M Patel, MD   25 mcg at 07/09/15 0903  . fentaNYL (SUBLIMAZE) injection 50 mcg  50 mcg Intravenous Q2H PRN Storm FriskPatrick E Wright, MD   50 mcg at 07/06/15 0437  . free water 50 mL  50 mL Per Tube 3 times per day Rolly SalterPranav M Patel, MD      . furosemide (LASIX) injection 60 mg  60 mg Intravenous BID Rolly SalterPranav M Patel, MD   60 mg at 07/09/15 0848  . guaiFENesin (ROBITUSSIN) 100 MG/5ML solution 300 mg  15 mL Per Tube QID Rolly SalterPranav M Patel, MD   300 mg at 07/09/15 1335  . heparin injection 5,000 Units  5,000 Units Subcutaneous 3 times per day Barnetta ChapelKelly Osborne, PA-C    5,000 Units at 07/09/15 1335  . insulin aspart (novoLOG) injection 0-9 Units  0-9 Units Subcutaneous TID WC Albertine GratesFang Xu, MD   3 Units at 07/09/15 1210  . labetalol (NORMODYNE,TRANDATE) injection 10 mg  10 mg Intravenous Q2H PRN Albertine GratesFang Xu, MD   10 mg at 07/06/15 0437  . levalbuterol (XOPENEX) nebulizer solution 0.63 mg  0.63 mg Nebulization Q6H Roslynn AmbleJennings E Nestor, MD   0.63 mg at 07/09/15 1500  . nystatin (MYCOSTATIN) 100000 UNIT/ML suspension 500,000 Units  5 mL Oral QID Albertine GratesFang Xu, MD   500,000 Units at 07/09/15 1335  . polyethylene glycol (MIRALAX / GLYCOLAX) packet 17 g  17 g Per Tube Daily PRN Rolly SalterPranav M Patel, MD      . scopolamine (TRANSDERM-SCOP) 1 MG/3DAYS 1.5 mg  1 patch Transdermal Q72H Roslynn AmbleJennings E Nestor, MD   1.5 mg at 07/07/15 2356  . senna-docusate (Senokot-S) tablet 2 tablet  2 tablet Per Tube QHS PRN Rolly SalterPranav M Patel, MD         Discharge Medications: Please see discharge summary for a list of discharge medications.  Relevant Imaging Results:  Relevant Lab Results:   Additional Information SS#  AdelphiHoloman, Binnie RailJenna M, [place]

## 2015-07-09 NOTE — Progress Notes (Signed)
Trach Team    Patient Details Name: Gregory Parker MRN: [id number] DOB: 1955/08/18 Today's Date: 07/09/2015 Time:  -       Chart reviewed, spoke with RN as part of SLP. Pt continues with copious secretions, does not follow directions or attempt to verbally communicate at this time and does not appear appropriate for speaking valve. Will continue to monitor while in hospital.      Darrow BussingLisa Willis Arleth Mccullar M.Ed [employer]-SLP Pager [phone]

## 2015-07-09 NOTE — Progress Notes (Signed)
CSW faxed out updated referrals to facilities and called several to discuss pt improvement in suctioning needs.  Suctioning needs still to great at this point but CSW will continue to work on disposition and follow up with other offers.  Merlyn LotJenna Holoman, LCSWA Clinical Social Worker [phone]

## 2015-07-09 NOTE — Progress Notes (Addendum)
[facility] Progress Note  Patient: Gregory Parker ZOX:[id number]   PCP: Isabella Stalling, MD DOB: [birth year]   DOA: 06/04/2015   DOS: 07/09/2015   Date of Service: the patient was seen and examined on 07/09/2015  Subjective: Patient had 700 mL of free water yesterday per tube. Secretions require now only every 4 hours suctioning. No other acute events no significant difference in patient's status overnight. Nutrition: On tube feeding  Brief hospital course: Patient was admitted on 06/04/2015, with complaint of shaking episodes, was found to have status epilepticus, patient was intubated at the [facility] and transferred to [facility]. Patient was initially also started on broad-spectrum antibiotics with CNS coverage, but lumbar puncture was negative. His sputum grew Streptococcus and patient was treated with ceftriaxone for 8 days. Patient also developed rhabdomyolysis which improved with fluids. Initially was on dantrolene for spasticity. After discussing with patient's brother critical care decided to insert tracheostomy as well as PEG tube since the patient was not able to come off the vent. tube feedings were initiated, and the patient was transferred to hospitalist service on 06/30/2015. Currently further plan is continue IV Lasix and await improvement of excess tracheal secretion.  Assessment and Plan: 1. Status epilepticus [facility]) Patient presented with acute encephalopathy most up to secondary to alcohol withdrawal causing seizure. Patient was also suspected to have neuroleptic malignant syndrome during the hospitalization. Continues to remain noncommunicative, not following commands. Difficult to understand patient's baseline. I suspect that the possibility of improvement in his mental status is less likely as the patient may have sustained significant hypoxic brain damage during the initial event. Initially the patient was given IV Keppra which was discontinued on  06/08/2015 since there was no evidence of seizure and EEG was unremarkable. Currently status post trach and PEG. patient was on fentanyl in the ICU and has been transferred to step down with fentanyl patch, we will gradually reduce the dose. Change 25 g daily  2. Acute respiratory failure with hypoxia Status post tracheostomy. Pneumococcal pneumonia. Excessive tracheal secretion. Recurrent aspiration  Acute on chronic diastolic dysfunction with CHF and volume overload Tracheal culture is negative for any growth even though the Gram stain was positive. No antibiotics Blood cultures are negative performed on 07/03/2015. Increased secretion can also be likely aspiration from tube feeding. Interventional radiology does not feel to the PEG tube needs to be repositioned We'll monitor aspiration precautions for the patient. Use scopolamine patch. Steroids has been tapered off Continue IV Lasix at present 60 milligrams twice a day  3. Essential hypertension. Continue Cardizem at present.Continue Lasix. Continue labetalol as needed.  4. Constipation. Currently resolved  Changing Senokot as needed continue MiraLAX scheduled  5. Spasticity with rhabdomyolysis. Peak CK 5000. Improved with IV dantrolene. Currently renal function stable. We'll monitor.  6. Acute left basal ganglia lacunar infarct. Neurology consulted. Continue aspirin Echocardiogram shows ejection fraction 60-65%. CTA head and neck unremarkable.  7. Alcohol abuse. Out of the window for withdrawal at present. Continue to closely monitor  8. Severe Protein calorie malnutrition. Continue tube feeding. Tolerating well at reduced rate Reduce PEG tube free water  Activity: physical therapy SNF Bowel regimen: last BM 07/07/2015 DVT Prophylaxis: subcutaneous Heparin Nutrition: On tube feeding Advance goals of care discussion: Full code  Procedures: Intubation and tracheostomy insertion PEG tube insertion EEG to  2/28, 3/06 Echocardiogram Consultants: Critical care primarily admission, neurology, interventional radiology, Antibiotics: Anti-infectives    Start     Dose/Rate Route Frequency Ordered Stop  07/04/15 1500  levofloxacin (LEVAQUIN) IVPB 750 mg  Status:  Discontinued     750 mg 100 mL/hr over 90 Minutes Intravenous Every 24 hours 07/04/15 1348 07/05/15 1102   06/26/15 1000  ceFAZolin (ANCEF) IVPB 2 g/50 mL premix     2 g 100 mL/hr over 30 Minutes Intravenous To Short Stay 06/26/15 0927 06/26/15 1023   06/26/15 0904  ceFAZolin (ANCEF) 2-3 GM-% IVPB SOLR    Comments:  Dhers, Patricia   : cabinet override      06/26/15 0904 06/26/15 2114   06/25/15 0000  ceFAZolin (ANCEF) IVPB 2 g/50 mL premix     2 g 100 mL/hr over 30 Minutes Intravenous To Radiology 06/23/15 1903 06/25/15 0121   06/10/15 1000  cefTRIAXone (ROCEPHIN) 2 g in dextrose 5 % 50 mL IVPB     2 g 100 mL/hr over 30 Minutes Intravenous Every 12 hours 06/10/15 0958 06/17/15 1011   06/10/15 0945  cefTRIAXone (ROCEPHIN) 1 g in dextrose 5 % 50 mL IVPB  Status:  Discontinued     1 g 100 mL/hr over 30 Minutes Intravenous Every 24 hours 06/10/15 0934 06/10/15 0958   06/07/15 2330  cefTRIAXone (ROCEPHIN) 1 g in dextrose 5 % 50 mL IVPB  Status:  Discontinued     1 g 100 mL/hr over 30 Minutes Intravenous Daily at bedtime 06/07/15 2248 06/10/15 0921   06/07/15 0130  vancomycin (VANCOCIN) 500 mg in sodium chloride 0.9 % 100 mL IVPB  Status:  Discontinued     500 mg 100 mL/hr over 60 Minutes Intravenous Every 12 hours 06/06/15 1228 06/07/15 1123   06/06/15 1330  acyclovir (ZOVIRAX) 550 mg in dextrose 5 % 100 mL IVPB  Status:  Discontinued     10 mg/kg  55 kg 111 mL/hr over 60 Minutes Intravenous Every 8 hours 06/06/15 1228 06/07/15 1123   06/06/15 1330  vancomycin (VANCOCIN) IVPB 1000 mg/200 mL premix     1,000 mg 200 mL/hr over 60 Minutes Intravenous  Once 06/06/15 1228 06/06/15 1625   06/06/15 1330  Ampicillin-Sulbactam (UNASYN) 3 g in  sodium chloride 0.9 % 100 mL IVPB  Status:  Discontinued     3 g 100 mL/hr over 60 Minutes Intravenous Every 8 hours 06/06/15 1228 06/07/15 1123   06/06/15 1300  cefTRIAXone (ROCEPHIN) 2 g in dextrose 5 % 50 mL IVPB  Status:  Discontinued     2 g 100 mL/hr over 30 Minutes Intravenous Every 12 hours 06/06/15 1207 06/07/15 1123   06/06/15 1215  cefTRIAXone (ROCEPHIN) 2 g in dextrose 5 % 50 mL IVPB  Status:  Discontinued     2 g 100 mL/hr over 30 Minutes Intravenous Every 24 hours 06/06/15 1200 06/06/15 1206      Family Communication: no family was present at bedside, at the time of interview.   Disposition:  Barriers to safe discharge: Transfer to SNF when secretions are improved   Intake/Output Summary (Last 24 hours) at 07/09/15 1412 Last data filed at 07/09/15 1300  Gross per 24 hour  Intake   1285 ml  Output   1152 ml  Net    133 ml   Filed Weights   07/07/15 0500 07/08/15 0500 07/09/15 0420  Weight: 48.7 kg (107 lb 5.8 oz) 49.8 kg (109 lb 12.6 oz) 49.9 kg (110 lb 0.2 oz)   Objective: Physical Exam: Filed Vitals:   07/09/15 0900 07/09/15 1000 07/09/15 1100 07/09/15 1231  BP: 130/96 124/76 96/72 102/73  Pulse: 103 86  87 95  Temp:    98.7 F (37.1 C)  TempSrc:    Oral  Resp: 20 19 19 20   Height:      Weight:      SpO2: 100% 98% 96% 97%    General: Appear in no distress, no Rash; patient not allowing complete examination, and blocking with his hand. Cardiovascular: S1 and S2 Present, no Murmur,  Respiratory: Bilateral Air entry present and improving Crackles, no wheezes Abdomen: Bowel Sound sluggish, Soft and no tenderness Extremities: no Pedal edema, no calf tenderness  Data Reviewed: CBC:  Recent Labs Lab 07/05/15 0625 07/06/15 0415 07/07/15 0658 07/08/15 0255 07/09/15 0423  WBC 13.7* 12.4* 10.8* 10.8* 10.4  HGB 12.1* 11.6* 11.5* 11.8* 11.5*  HCT 37.4* 35.4* 35.3* 36.8* 34.9*  MCV 92.3 92.2 92.9 93.2 93.3  PLT 410* 383 388 371 328   Basic Metabolic  Panel:  Recent Labs Lab 07/04/15 0530 07/05/15 0625 07/06/15 0415 07/08/15 0255 07/09/15 0423  NA 136 136 134* 137 136  K 4.1 4.5 4.7 4.1 4.0  CL 99* 96* 97* 97* 96*  CO2 27 25 24 28 29   GLUCOSE 127* 153* 140* 169* 182*  BUN 29* 37* 39* 43* 43*  CREATININE 0.70 0.91 0.80 0.79 0.75  CALCIUM 9.8 9.9 9.7 9.7 9.7  MG  --  2.0 2.1  --   --   PHOS  --   --   --  4.8* 4.3   Liver Function Tests:  Recent Labs Lab 07/03/15 0513 07/05/15 0625 07/07/15 0658 07/08/15 0255 07/09/15 0423  AST 66* 69* 67*  --   --   ALT 110* 103* 102*  --   --   ALKPHOS 98 106 109  --   --   BILITOT 0.6 0.5 0.6  --   --   PROT 7.7 7.8 7.6  --   --   ALBUMIN 2.9* 3.0* 3.1* 3.2* 3.1*   CBG:  Recent Labs Lab 07/08/15 2041 07/09/15 0053 07/09/15 0420 07/09/15 0742 07/09/15 1159  GLUCAP 144* 163* 177* 166* 127*    Recent Results (from the past 240 hour(s))  Culture, respiratory (NON-Expectorated)     Status: None   Collection Time: 07/03/15  7:40 AM  Result Value Ref Range Status   Specimen Description TRACHEAL ASPIRATE  Final   Special Requests NONE  Final   Gram Stain   Final    ABUNDANT WBC PRESENT, PREDOMINANTLY PMN NO SQUAMOUS EPITHELIAL CELLS SEEN MODERATE GRAM POSITIVE COCCI IN PAIRS MODERATE GRAM NEGATIVE RODS FEW GRAM POSITIVE RODS [facility]    Culture   Final    NORMAL OROPHARYNGEAL FLORA Performed at [facility]    Report Status 07/05/2015 FINAL  Final  Culture, blood (routine x 2)     Status: None   Collection Time: 07/03/15  7:45 AM  Result Value Ref Range Status   Specimen Description BLOOD RIGHT ARM  Final   Special Requests BOTTLES DRAWN AEROBIC AND ANAEROBIC 10CC  Final   Culture NO GROWTH 5 DAYS  Final   Report Status 07/08/2015 FINAL  Final  Culture, blood (routine x 2)     Status: None   Collection Time: 07/03/15  7:50 AM  Result Value Ref Range Status   Specimen Description BLOOD RIGHT HAND  Final   Special Requests  BOTTLES DRAWN AEROBIC AND ANAEROBIC 10CC  Final   Culture NO GROWTH 5 DAYS  Final   Report Status 07/08/2015 FINAL  Final  Studies: No results found.   Scheduled Meds: . aspirin  81 mg Per Tube Daily  . diltiazem  60 mg Per Tube 4 times per day  . famotidine  20 mg Per Tube BID  . fentaNYL  25 mcg Transdermal Q72H  . free water  50 mL Per Tube Q6H  . furosemide  60 mg Intravenous BID  . guaiFENesin  15 mL Per Tube QID  . heparin  5,000 Units Subcutaneous 3 times per day  . insulin aspart  0-9 Units Subcutaneous TID WC  . levalbuterol  0.63 mg Nebulization Q6H  . nystatin  5 mL Oral QID  . scopolamine  1 patch Transdermal Q72H   Continuous Infusions: . feeding supplement (VITAL 1.5 CAL) 1,000 mL (07/08/15 1938)   PRN Meds: acetaminophen (TYLENOL) oral liquid 160 mg/5 mL, fentaNYL (SUBLIMAZE) injection, labetalol, polyethylene glycol, senna-docusate  Time spent: 30 minutes  Author: Lynden Oxford, MD [facility] Pager: [phone] 07/09/2015 2:12 PM  If 7PM-7AM, please contact night-coverage at [email or web address], password [facility]

## 2015-07-10 LAB — RENAL FUNCTION PANEL
ALBUMIN: 3.1 g/dL — AB (ref 3.5–5.0)
ANION GAP: 14 (ref 5–15)
BUN: 42 mg/dL — ABNORMAL HIGH (ref 6–20)
CALCIUM: 9.7 mg/dL (ref 8.9–10.3)
CO2: 29 mmol/L (ref 22–32)
CREATININE: 0.82 mg/dL (ref 0.61–1.24)
Chloride: 94 mmol/L — ABNORMAL LOW (ref 101–111)
Glucose, Bld: 173 mg/dL — ABNORMAL HIGH (ref 65–99)
PHOSPHORUS: 4.6 mg/dL (ref 2.5–4.6)
Potassium: 4.1 mmol/L (ref 3.5–5.1)
SODIUM: 137 mmol/L (ref 135–145)

## 2015-07-10 LAB — GLUCOSE, CAPILLARY
GLUCOSE-CAPILLARY: 143 mg/dL — AB (ref 65–99)
GLUCOSE-CAPILLARY: 173 mg/dL — AB (ref 65–99)
GLUCOSE-CAPILLARY: 156 mg/dL — AB (ref 65–99)
Glucose-Capillary: 167 mg/dL — ABNORMAL HIGH (ref 65–99)
Glucose-Capillary: 176 mg/dL — ABNORMAL HIGH (ref 65–99)

## 2015-07-10 LAB — CBC
HCT: 35.2 % — ABNORMAL LOW (ref 39.0–52.0)
Hemoglobin: 11.2 g/dL — ABNORMAL LOW (ref 13.0–17.0)
MCH: 29.6 pg (ref 26.0–34.0)
MCHC: 31.8 g/dL (ref 30.0–36.0)
MCV: 92.9 fL (ref 78.0–100.0)
PLATELETS: 292 10*3/uL (ref 150–400)
RBC: 3.79 MIL/uL — AB (ref 4.22–5.81)
RDW: 15.8 % — ABNORMAL HIGH (ref 11.5–15.5)
WBC: 9.8 10*3/uL (ref 4.0–10.5)

## 2015-07-10 MED ORDER — VITAL 1.5 CAL PO LIQD: 1000.0000 mL | ORAL | Status: AC

## 2015-07-10 MED ORDER — DILTIAZEM 12 MG/ML ORAL SUSPENSION: 60.0000 mg | Freq: Four times a day (QID) | ORAL | Status: DC

## 2015-07-10 MED ORDER — TORSEMIDE 10 MG PO TABS
30.0000 mg | ORAL_TABLET | Freq: Two times a day (BID) | ORAL | Status: AC
Start: 2015-07-10 — End: ?

## 2015-07-10 MED ORDER — ASPIRIN 81 MG PO CHEW: 81.0000 mg | CHEWABLE_TABLET | Freq: Every day | ORAL | Status: AC

## 2015-07-10 MED ORDER — TORSEMIDE 20 MG PO TABS
30.0000 mg | ORAL_TABLET | Freq: Two times a day (BID) | ORAL | Status: DC
  Administered 2015-07-10 – 2015-07-12 (×5): 30 mg
  Filled 2015-07-10 (×5): qty 1

## 2015-07-10 MED ORDER — LEVALBUTEROL HCL 0.63 MG/3ML IN NEBU: 0.6300 mg | INHALATION_SOLUTION | Freq: Four times a day (QID) | RESPIRATORY_TRACT | Status: AC | PRN

## 2015-07-10 MED ORDER — POLYETHYLENE GLYCOL 3350 17 G PO PACK: 17.0000 g | PACK | Freq: Every day | ORAL | Status: AC | PRN

## 2015-07-10 MED ORDER — METOLAZONE 2.5 MG PO TABS
2.5000 mg | ORAL_TABLET | Freq: Once | ORAL | Status: AC
  Administered 2015-07-10: 2.5 mg
  Filled 2015-07-10: qty 1

## 2015-07-10 MED ORDER — NYSTATIN 100000 UNIT/ML MT SUSP: 5.0000 mL | Freq: Four times a day (QID) | OROMUCOSAL | Status: DC

## 2015-07-10 MED ORDER — FREE WATER: 50.0000 mL | Freq: Three times a day (TID) | Status: AC

## 2015-07-10 MED ORDER — SCOPOLAMINE 1 MG/3DAYS TD PT72: 1.0000 | MEDICATED_PATCH | TRANSDERMAL | Status: DC

## 2015-07-10 MED ORDER — FAMOTIDINE 40 MG/5ML PO SUSR: 20.0000 mg | Freq: Two times a day (BID) | ORAL | Status: AC

## 2015-07-10 MED ORDER — FENTANYL 25 MCG/HR TD PT72: 25.0000 ug | MEDICATED_PATCH | TRANSDERMAL | Status: DC

## 2015-07-10 NOTE — Discharge Summary (Signed)
[facility] Discharge Summary   Patient: Gregory Parker ZOX:[id number]   PCP: Isabella Stalling, MD DOB: 01-22-1956   Date of admission: 06/04/2015   Date of discharge:  07/10/2015    Discharge Diagnoses:  Principal Problem:   Status epilepticus (HCC) Active Problems:   Seizures (HCC)   Encounter for intubation   Alcohol abuse   Malignant hypertension   Cerebral thrombosis with cerebral infarction   Coma (HCC)   Alcohol withdrawal (HCC)   Pneumonia   Acute encephalopathy   Acute respiratory failure with hypoxemia (HCC)   Constipation   SEVERE Protein calorie malnutrition (HCC)   Tracheostomy care (HCC)   PEG (percutaneous endoscopic gastrostomy) status (HCC)   Dysphagia   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 Please follow-up with PCP as well as neurology and pulmonary  2. Discharge into SNF  Follow-up Information    Follow up with DONDIEGO,RICHARD M, MD. Schedule an appointment as soon as possible for a visit in 1 week.   Specialty:  Internal Medicine   Contact information:   [address] [place] [address] [phone]       Follow up with [facility]. Schedule an appointment as soon as possible for a visit in 2 weeks.   Specialty:  Neurology   Contact information:   [address] [place] [address] [phone]      Follow up with [facility]. Schedule an appointment as soon as possible for a visit in 2 weeks.   Specialty:  Pulmonology   Contact information:   [address] [place] [address] [phone]     Diet recommendation: Continue to feeding, continue speech therapy, at present nothing by mouth  Activity: The patient is advised to gradually reintroduce usual activities.  Discharge Condition: fair  History of present illness: As per the H and P dictated on admission, "60 year old male with  PMH as below, which is significant for HTN, ETOH, and CVA. 2/27 EMS was dispatched to the patient's home due to reports of shaking episodes with decreased responsiveness. He has a history of ETOH abuse and family reports that last drink was today, and he has only had one drink today. Family also reports that he may have smoked crack over the weekend. He was last seen normal 430 PM. In the emergency department, staff noted urinary incontinence, leftward gaze deviation, R facial droop, and posturing on the L side. He then proceeded to have seizure like activity with limited airway protection, and thus was intubated by EDP. He had a CT of the head which showed, no acute abnormality, but remote basal ganglia infarcts. He was loaded with 1G IV Keppra and started on propofol for sedation/seizure management. He was transferred to [facility] for ICU admission and neurology evaluation. "  Hospital Course:  Patient was admitted on 06/04/2015, with complaint of shaking episodes, was found to have status epilepticus, patient was [facility]ub[facility] at [facility] and transferred to [facility]. Patient was initially also started on broad-spectrum antibiotics with CNS coverage, but lumbar puncture was negative. His sputum grew Streptococcus and patient was treated with ceftriaxone for 8 days. Patient also developed rhabdomyolysis which improved with fluids. Initially was on dantrolene for spasticity. After discussing with patient's brother critical care decided to insert tracheostomy as well as PEG tube since the patient was not able to come off the vent. tube feedings were initiated,  and the patient was transferred to hospitalist service on 06/30/2015. Patient was given significant IV fluid bolus while he was in the ICU and required multiple doses of IV Lasix. Would continue diuresis on discharge, although it appears that patient is 12 L positive clinically he is not significantly volume overloaded.  Summary of  his active problems in the hospital is as following. 1. Status epilepticus (HCC) Acute encephalopathy Neuroleptic malignant syndrome Patient presented with acute encephalopathy most up to secondary to alcohol withdrawal causing seizure. Patient was also suspected to have neuroleptic malignant syndrome during the hospitalization. Continues to remain noncommunicative, not following commands. Difficult to understand patient's baseline. No significant meaningful recovery of neurological function from initial presentation. Thus making prognosis poor Patient may have sustained significant hypoxic brain damage during the initial event. Initially the patient was given IV Keppra which was discontinued on 06/08/2015 since there was no evidence of seizure and multiple as well as continuous EEG was unremarkable for any epileptogenic foci. Currently status post trach and PEG.  2. Acute respiratory failure with hypoxia Status post tracheostomy. Pneumococcal pneumonia. Excessive tracheal secretion. Recurrent aspiration  Acute on chronic diastolic dysfunction with CHF and volume overload Tracheal culture is negative for any growth even though the Gram stain was positive. No antibiotics Blood cultures are negative performed on 07/03/2015. Interventional radiology does not feel to the PEG tube needs to be repositioned We'll monitor aspiration precautions for the patient. Use scopolamine patch. Steroids has been tapered off Continue torsemide 30 mg twice a day can increase as and when needed. We'll continue Foley catheter to monitor ins and outs. Although it mentions that the patient is 12 L positive clinical examination the patient does not appear significantly volume overloaded. Chest x-ray also does not show any evidence of significant congestion. Oxygenation is also improving.  3. Essential hypertension. Continue Cardizem at present.Continue Lasix.  4. Constipation. Currently resolved  Changing  Senokot as needed continue MiraLAX scheduled  5. Spasticity with rhabdomyolysis. Peak CK 5000. Improved with IV dantrolene. Currently renal function stable. We'll monitor.  6. Acute left basal ganglia lacunar infarct. Neurology consulted. Continue aspirin Echocardiogram shows ejection fraction 60-65%. CTA head and neck unremarkable. Follow-up with neurology  7. Alcohol abuse. Out of the window for withdrawal at present. Continue to closely monitor Continue B1 and folic acid  8. Severe Protein calorie malnutrition. Continue tube feeding. Tolerating well at reduced rate Reduce PEG tube free water  9. Pain management. patient was on fentanyl drip in the ICU and has been transferred to step down with fentanyl patch 50 g, patient was not on any narcotic medications prior to admission therefore we will gradually reduce the dose.  Change 25 g daily, and tapered it off completely as tolerated.   All other chronic medical condition were stable during the hospitalization.  Patient was seen by physical therapy, who recommended SNF, which was arranged by Child psychotherapist and case Production designer, theatre/television/film. On the day of the discharge the patient's secretions were controlled, and no other acute medical condition were reported by patient. the patient was felt safe to be discharge at SNF with therapy.  Procedures and Results:   EEG 06/05/2015, 06/11/2015  Overnight video EEG 06/07/2015, 06/08/2015  Lumbar puncture 06/06/2015  Bronchoscopy, percutaneous tracheostomy 06/15/2015  IR guided PEG tube placement 06/26/2015  Tracheostomy tube change 06/28/2015  Echocardiogram Study Conclusions  - Left ventricle: The cavity size was normal. Wall thickness was  normal. Systolic function was normal. The estimated ejection  fraction was in the  range of 60% to 65%. Wall motion was normal;  there were no regional wall motion abnormalities. Doppler  parameters are consistent with abnormal left ventricular   relaxation (grade 1 diastolic dysfunction). - Aortic valve: There was no stenosis. - Mitral valve: Mildly calcified annulus. There was no significant  regurgitation. - Right ventricle: The cavity size was normal. Systolic function  was normal. - Pulmonary arteries: No complete TR doppler jet so unable to  estimate PA systolic pressure. - Inferior vena cava: The vessel was normal in size. The  respirophasic diameter changes were in the normal range (>= 50%),  consistent with normal central venous pressure. - Pericardium, extracardiac: Small, circumferential pericardial  effusion.  Impressions:  - Normal LV size and systolic function, EF 60-65%. Normal RV size  and systolic function. No significant valvular abnormalities.  Small circumferential pericardial effusion.  Consultations:  Primary admission with critical care,  Neurology  DISCHARGE MEDICATION: Current Discharge Medication List    START taking these medications   Details  aspirin 81 MG chewable tablet Place 1 tablet (81 mg total) into feeding tube daily. Qty: 30 tablet, Refills: 0    diltiazem (CARDIZEM) 10 mg/ml oral suspension Place 6 mLs (60 mg total) into feeding tube every 6 (six) hours. Qty: 60 mL, Refills: 0    famotidine (PEPCID) 40 MG/5ML suspension Place 2.5 mLs (20 mg total) into feeding tube 2 (two) times daily. Qty: 50 mL, Refills: 0    fentaNYL (DURAGESIC - DOSED MCG/HR) 25 MCG/HR patch Place 1 patch (25 mcg total) onto the skin every 3 (three) days. Qty: 1 patch, Refills: 0    levalbuterol (XOPENEX) 0.63 MG/3ML nebulizer solution Take 3 mLs (0.63 mg total) by nebulization every 6 (six) hours as needed for wheezing or shortness of breath. Qty: 3 mL, Refills: 12    Nutritional Supplements (FEEDING SUPPLEMENT, VITAL 1.5 CAL,) LIQD Place 1,000 mLs into feeding tube continuous. Qty: 3000 mL, Refills: 0    nystatin (MYCOSTATIN) 100000 UNIT/ML suspension Take 5 mLs (500,000 Units total) by  mouth 4 (four) times daily. Qty: 60 mL, Refills: 0    polyethylene glycol (MIRALAX / GLYCOLAX) packet Place 17 g into feeding tube daily as needed for mild constipation. Qty: 14 each, Refills: 0    scopolamine (TRANSDERM-SCOP) 1 MG/3DAYS Place 1 patch (1.5 mg total) onto the skin every 3 (three) days. Qty: 10 patch, Refills: 12    torsemide (DEMADEX) 10 MG tablet Place 3 tablets (30 mg total) into feeding tube 2 (two) times daily. Qty: 30 tablet, Refills: 0    Water For Irrigation, Sterile (FREE WATER) SOLN Place 50 mLs into feeding tube every 8 (eight) hours.      STOP taking these medications     amLODipine (NORVASC) 5 MG tablet      aspirin EC 81 MG tablet      Cholecalciferol (VITAMIN D) 2000 units tablet        Allergies  Allergen Reactions  . Chlorhexidine Swelling    Discharge Exam: Filed Weights   07/08/15 0500 07/09/15 0420 07/10/15 0417  Weight: 49.8 kg (109 lb 12.6 oz) 49.9 kg (110 lb 0.2 oz) 50.5 kg (111 lb 5.3 oz)   Filed Vitals:   07/10/15 1059 07/10/15 1114  BP: 117/78 117/78  Pulse:  94  Temp: 98.9 F (37.2 C)   Resp: 22 25   General: Appear in no distress, no Rash; patient not allowing complete examination, and blocking with his hand. Cardiovascular: S1 and S2 Present, no Murmur,  Respiratory: Bilateral Air entry present and improving Crackles, no wheezes Abdomen: Bowel Sound sluggish, Soft and no tenderness Neurology: Right-sided weakness, not following command, spontaneously moving left upper and lower extremity, tracking with eye. Extremities: no Pedal edema, no calf tenderness  The results of significant diagnostics from this hospitalization (including imaging, microbiology, ancillary and laboratory) are listed below for reference.    Significant Diagnostic Studies: Ct Abdomen Wo Contrast  06-28-15  CLINICAL DATA:  60 year old male with a history of dysphagia. EXAM: CT ABDOMEN WITHOUT CONTRAST TECHNIQUE: Multidetector CT imaging of the  abdomen was performed following the standard protocol without IV contrast. COMPARISON:  None. FINDINGS: Lower chest: Edema/ anasarca of the lower chest wall soft tissues. Adenopathy along the left chest wall. Minimal gynecomastia. Bilateral small pleural effusions with associated atelectasis. Gastric tube within the esophagus, terminating within the stomach. Heart size within normal limits without significant pericardial fluid/ thickening. Abdomen/pelvis: Unremarkable appearance of the liver. Gallbladder is contracted with no radiopaque stones. No pericholecystic fluid. Unremarkable spleen. Unremarkable bilateral adrenal glands. Unremarkable pancreas. Visualized colon and small bowel unremarkable, with no abnormal distention. Similar to the chest wall there is vague infiltration of the abdominal mesenteric. No significant ascites. No hydronephrosis or nephrolithiasis of the visualized kidneys. Hypodense structure or on the lateral cortex of the right kidney and the inferior cortex of the right kidney, both with low Hounsfield units. Superior measures 1.5 cm. Inferior measures 1.4 cm. Scattered vascular calcifications. Degenerative changes of the spine. No acute fracture. Multiple endplate irregularities compatible with Schmorl's nodes. Vacuum disc phenomenon of the mid lumbar spine. No significant bony canal narrowing. IMPRESSION: No acute intra abdominal process. Bilateral pleural effusions and atelectasis. Body wall and abdominal edema/ anasarca. Recommend correlation with fluid status. Right-sided low-density kidney lesions, most likely benign cysts though not fully characterized by noncontrast CT. Signed, Yvone Neu. Loreta Ave, DO [facility] Electronically Signed   By: Gilmer Mor D.O.   On: 28-Jun-2015 07:07   Ir Gastrostomy Tube Mod Sed  06/26/2015  INDICATION: History of stroke, now with dysphagia. Please perform percutaneous gastrostomy tube placement  for enteric nutrition supplementation EXAM: PUSH GASTROSTOMY TUBE PLACEMENT COMPARISON:  CT abdomen pelvis - June 27, 2025 MEDICATIONS: Ancef 2 gm IV; Antibiotics were administered within 1 hour of the procedure. CONTRAST:  50 mL of Isovue 300 administered into the gastric lumen. ANESTHESIA/SEDATION: Moderate (conscious) sedation was employed during this procedure. A total of Versed 2 mg and Fentanyl 100 mcg was administered intravenously. Moderate Sedation Time: 25 minutes. The patient's level of consciousness and vital signs were monitored continuously by radiology nursing throughout the procedure under my direct supervision. FLUOROSCOPY TIME:  10 minutes 42 seconds (140 mGy) COMPLICATIONS: None immediate. PROCEDURE: Informed written consent was obtained from the patient's family following explanation of the procedure, risks, benefits and alternatives. A time out was performed prior to the initiation of the procedure. Ultrasound scanning was performed to demarcate the edge of the left lobe of the liver. Maximal barrier sterile technique utilized including caps, mask, sterile gowns, sterile gloves, large sterile drape, hand hygiene and Betadine prep. The left upper quadrant was sterilely prepped and draped. A oral gastric catheter was inserted into the stomach under fluoroscopy. The existing nasogastric feeding tube was removed. The left costal margin and air opacified transverse colon were identified and avoided. Air was injected into the stomach for insufflation and visualization under fluoroscopy. Under sterile conditions and local anesthesia, 3 T tacks were utilized to pexy the anterior aspect of  the stomach against the ventral abdominal wall. Contrast injection confirmed appropriate positioning of each of the T tacks. An incision was made between the T tacks and a 17 gauge trocar needle was utilized to access the stomach. Needle position was confirmed within the stomach with aspiration of air and injection of a  small amount of contrast. A stiff Glidewire was advanced into the gastric lumen and under intermittent fluoroscopic guidance, the access needle was exchanged for a Kumpe catheter. With the use of the Kumpe catheter, attempts were made to advance a stiff Glidewire into duodenum, however this ultimately proved unsuccessful. Under intermittent fluoroscopic guidance, the Kumpe catheter was exchanged for serial dilators, ultimately allowing placement of a 20 French peel-away sheath. Under intermittent fluoroscopic guidance, a 18-French balloon retention gastrostomy tube was inserted through the peel-away sheath. The retention balloon was insufflated with a mixture of dilute saline and contrast and pulled taut against the anterior wall of the stomach. The external disc was cinched. Contrast injection confirms positioning within the stomach. Several spot radiographic images were obtained in various obliquities for documentation. T he patient tolerated procedure well without immediate post procedural complication. FINDINGS: After successful fluoroscopic guided placement, the gastrostomy tube is appropriately positioned with internal retention balloon against the ventral aspect of the gastric lumen. IMPRESSION: Successful fluoroscopic insertion of an 1 French balloon retention gastrostomy tube. The gastrostomy may be used immediately for medication administration and in 24 hrs for the initiation of feeds. Electronically Signed   By: Simonne Come M.D.   On: 06/26/2015 11:03   Dg Chest Port 1 View  07/04/2015  CLINICAL DATA:  Possible aspiration EXAM: PORTABLE CHEST 1 VIEW COMPARISON:  07/03/2015 FINDINGS: Tracheostomy tube is again identified. The cardiac shadow is stable. The lungs are well aerated bilaterally. Interstitial changes seen on the prior exam are again noted and stable. No new focal infiltrate is seen. No bony abnormality is noted. IMPRESSION: Stable appearance of the chest when compared with the previous day.  Electronically Signed   By: Alcide Clever M.D.   On: 07/04/2015 12:36   Dg Chest Port 1 View  07/03/2015  CLINICAL DATA:  Respiratory distress EXAM: PORTABLE CHEST 1 VIEW COMPARISON:  June 30, 2015 FINDINGS: The heart size and mediastinal contours are within normal limits. Tracheostomy tube is unchanged. There is mild interstitial edema. There is no focal pneumonia or pleural effusion. The visualized skeletal structures are unremarkable. IMPRESSION: Mild interstitial edema. Electronically Signed   By: Sherian Rein M.D.   On: 07/03/2015 14:56   Dg Chest Port 1 View  06/30/2015  CLINICAL DATA:  Fever EXAM: PORTABLE CHEST 1 VIEW COMPARISON:  06/23/2015 FINDINGS: Cardiomediastinal silhouette is stable. Tracheostomy tube unchanged in position. Again noted bilateral patchy airspace disease bilaterally and streaky interstitial prominence suspicious for multifocal pneumonia. Superimposed mild interstitial edema cannot be excluded. Clinical correlation is necessary. IMPRESSION: Again noted bilateral patchy airspace disease bilaterally and streaky interstitial prominence suspicious for multifocal pneumonia. Superimposed mild interstitial edema cannot be excluded. Clinical correlation is necessary. Electronically Signed   By: Natasha Mead M.D.   On: 06/30/2015 15:39   Dg Chest Port 1 View  06/23/2015  CLINICAL DATA:  Respiratory failure EXAM: PORTABLE CHEST 1 VIEW COMPARISON:  06/21/2015 chest radiograph. FINDINGS: Tracheostomy tube tip overlies the tracheal air column at the thoracic inlet. Enteric tube enters the stomach with the tip not seen on this image. Stable cardiomediastinal silhouette with top-normal heart size. No pneumothorax. Probable trace left pleural effusion. There is severe patchy  and linear opacities in the bilateral upper lungs greater than lower lungs, slightly worsened. IMPRESSION: 1. Slight worsening of severe patchy and linear opacities in the upper greater than lower lungs bilaterally.  Differential includes pulmonary edema and/or multifocal pneumonia. 2. Probable trace left pleural effusion. Electronically Signed   By: Delbert Phenix M.D.   On: 06/23/2015 16:15   Dg Chest Port 1 View  06/21/2015  CLINICAL DATA:  Acute respiratory failure EXAM: PORTABLE CHEST 1 VIEW COMPARISON:  06/19/2015 FINDINGS: Tracheostomy remains in good position.  Gastric tube in the stomach. Diffuse bilateral airspace disease right greater than left is unchanged. No effusion. Mild atelectasis in the lung bases. IMPRESSION: Bilateral airspace disease unchanged. Support lines remain in satisfactory position. Electronically Signed   By: Marlan Palau M.D.   On: 06/21/2015 07:47   Dg Chest Port 1 View  06/19/2015  CLINICAL DATA:  Acute respiratory failure EXAM: PORTABLE CHEST 1 VIEW COMPARISON:  06/18/2015 FINDINGS: Cardiac shadow is stable. A tracheostomy tube and nasogastric catheter are noted in satisfactory position. Bilateral pulmonary infiltrates are noted relatively similar to that seen on the prior exam with the exception of some improved aeration in the right upper lobe. IMPRESSION: Diffuse bilateral infiltrates. Electronically Signed   By: Alcide Clever M.D.   On: 06/19/2015 08:15   Dg Chest Port 1 View  06/18/2015  CLINICAL DATA:  Patient with respiratory failure. EXAM: PORTABLE CHEST 1 VIEW COMPARISON:  Chest radiograph 06/17/2015. FINDINGS: Tracheostomy tube stable in position. Enteric tube tip and side-port project over the stomach. Grossly unchanged diffuse bilateral airspace pulmonary opacities. No pleural effusion or pneumothorax. Regional skeleton is unremarkable. IMPRESSION: Grossly unchanged diffuse bilateral airspace opacities. Considerations include multi focal pneumonia, edema or hemorrhage. Electronically Signed   By: Annia Belt M.D.   On: 06/18/2015 07:57   Dg Chest Port 1 View  06/17/2015  CLINICAL DATA:  Respiratory failure EXAM: PORTABLE CHEST 1 VIEW COMPARISON:  06/16/2015 FINDINGS:  Tracheostomy and NG tube are unchanged. Bilateral airspace opacities again noted, most confluent in the right upper lobe. Airspace disease slightly worsened since prior study. No effusions or acute bony abnormality. Heart is normal size. IMPRESSION: Bilateral airspace disease, slightly worsened since prior study. Electronically Signed   By: Charlett Nose M.D.   On: 06/17/2015 10:15   Dg Chest Port 1 View  06/16/2015  CLINICAL DATA:  Shortness of breath EXAM: PORTABLE CHEST 1 VIEW COMPARISON:  June 15, 2015 FINDINGS: An NG tube is been inserted in the interval with the distal tip in the left upper quadrant. The tracheostomy tube is in good position. No pneumothorax. Bilateral diffuse pulmonary opacities are mildly worsened in the interval. The opacity is a little more focal in the right upper lobe on today's study which is a new finding. IMPRESSION: Worsening pulmonary opacities, now more focal in the right upper lobe. Recommend clinical correlation and follow-up to resolution. Electronically Signed   By: Gerome Sam III M.D   On: 06/16/2015 08:17   Dg Chest Port 1 View  06/15/2015  CLINICAL DATA:  Newly placed tracheostomy tube. EXAM: PORTABLE CHEST - 1 VIEW COMPARISON:  One-view chest x-ray from the same day. FINDINGS: A tracheostomy tube is now in place. The endotracheal tube and NG tube have been removed. The heart size is normal. Mild interstitial coarsening a is again noted. Overall aeration is improved. The visualized soft tissues and bony thorax are unremarkable. IMPRESSION: 1. Interval placement of tracheostomy tube without radiographic evidence for complication. 2. Improved aeration  bilaterally. 3. Mild interstitial coarsening remains. Electronically Signed   By: Marin Robertshristopher  Mattern M.D.   On: 06/15/2015 15:06   Dg Chest Port 1 View  06/15/2015  CLINICAL DATA:  Acute respiratory failure, status epilepticus, alcoholic withdrawal, acute encephalopathy with cerebral infarction. EXAM: PORTABLE  CHEST 1 VIEW COMPARISON:  Portable chest x-ray of June 13, 2015 FINDINGS: The lungs are adequately inflated. There are persistent confluent alveolar opacities bilaterally. There is no pneumothorax or pleural effusion. The heart is normal in size. The pulmonary vascularity is not clearly engorged. The endotracheal tube tip lies 4.5 cm above the carina. The esophagogastric tube tip projects below the inferior margin of the image. IMPRESSION: Stable bilateral airspace opacities. No pleural effusion or pneumothorax. The support tubes are in stable position. Electronically Signed   By: David  Swazil[place]Jordan M.D.   On: 06/15/2015 08:04   Dg Chest Port 1 View  06/13/2015  CLINICAL DATA:  Acute respiratory failure EXAM: PORTABLE CHEST 1 VIEW COMPARISON:  06/12/2015 FINDINGS: Support devices are stable. Moderate to severe diffuse bilateral airspace disease again noted, slightly worsened in the right upper lobe since prior study. No effusions. Heart is borderline in size. IMPRESSION: Severe bilateral airspace disease, worsening since prior study, particularly in the right upper lobe. Electronically Signed   By: Charlett NoseKevin  Dover M.D.   On: 06/13/2015 07:51   Dg Chest Port 1 View  06/12/2015  CLINICAL DATA:  Respiratory failure. EXAM: PORTABLE CHEST 1 VIEW COMPARISON:  06/11/2015. FINDINGS: Endotracheal tube and NG tube in stable position. Heart size stable. Progressive bilateral pulmonary infiltrates. Small left pleural effusion. No pneumothorax. IMPRESSION: 1. Lines and tubes in stable position. 2. Progressive bilateral pulmonary infiltrates and/or edema. Small left pleural effusion. Electronically Signed   By: Maisie Fushomas  Register   On: 06/12/2015 07:33   Dg Chest Port 1 View  06/11/2015  CLINICAL DATA:  Pulmonary edema . EXAM: PORTABLE CHEST 1 VIEW COMPARISON:  06/10/2015. FINDINGS: Endotracheal tube and NG tube in stable position. Heart size stable. Pulmonary vascularity normal. Interim partial clearing of bilateral pulmonary  infiltrates and/or edema. Small left pleural effusion. No pneumothorax. IMPRESSION: 1. Lines and tubes stable position. 2. Interim partial clearing of bilateral pulmonary infiltrates and/or edema. Small left pleural effusion. Electronically Signed   By: Maisie Fushomas  Register   On: 06/11/2015 07:35   Dg Abd Portable 1v  07/01/2015  CLINICAL DATA:  Abdominal distension. No bowel movement since 06/26/2015 EXAM: PORTABLE ABDOMEN - 1 VIEW COMPARISON:  None. FINDINGS: Contrast material is noted throughout the colon as well as fecal material consistent with a degree of constipation. A gastrostomy catheter is noted in place. No free air is seen. No other focal abnormality is noted. IMPRESSION: Contrast fecal material throughout the colon consistent with a degree of constipation. No acute obstructive changes are noted. Electronically Signed   By: Alcide CleverMark  Lukens M.D.   On: 07/01/2015 16:30   Dg Abd Portable 1v  06/25/2015  CLINICAL DATA:  Check gastric catheter placement EXAM: PORTABLE ABDOMEN - 1 VIEW COMPARISON:  None. FINDINGS: Nasogastric catheter is noted within the stomach. Scattered infiltrative changes are noted in the bases bilaterally. Scattered large and small bowel gas is noted. IMPRESSION: Nasogastric catheter within the stomach. Electronically Signed   By: Alcide CleverMark  Lukens M.D.   On: 06/25/2015 18:19   Dg Abd Portable 1v  06/15/2015  CLINICAL DATA:  NG placement EXAM: PORTABLE ABDOMEN - 1 VIEW COMPARISON:  None. FINDINGS: Enteric tube terminates in the distal gastric body. Nonobstructive bowel gas pattern.  Moderate degenerative changes of the lumbar spine. IMPRESSION: Enteric tube terminates in the distal gastric body. Electronically Signed   By: Charline Bills M.D.   On: 06/15/2015 19:12   US Abdomen Limited Ruq  07/01/2015  CLINICAL DATA:  LFT elevation.  Alcohol abuse. EXAM: US ABDOMEN LIMITED - RIGHT UPPER QUADRANT COMPARISON:  None. FINDINGS: Gallbladder: No gallstones or wall thickening visualized. No  sonographic Murphy sign noted by sonographer. Common bile duct: Diameter: Upper limits normal 6.5 mm. Liver: No focal lesion identified. Within normal limits in parenchymal echogenicity. IMPRESSION: No evidence for gallstones or biliary obstruction. No focal hepatic abnormality is detected. Electronically Signed   By: Elsie Stain M.D.   On: 07/01/2015 14:01    Microbiology: Recent Results (from the past 240 hour(s))  Culture, respiratory (NON-Expectorated)     Status: None   Collection Time: 07/03/15  7:40 AM  Result Value Ref Range Status   Specimen Description TRACHEAL ASPIRATE  Final   Special Requests NONE  Final   Gram Stain   Final    ABUNDANT WBC PRESENT, PREDOMINANTLY PMN NO SQUAMOUS EPITHELIAL CELLS SEEN MODERATE GRAM POSITIVE COCCI IN PAIRS MODERATE GRAM NEGATIVE RODS FEW GRAM POSITIVE RODS Performed at [facility]    Culture   Final    NORMAL OROPHARYNGEAL FLORA Performed at [facility]    Report Status 07/05/2015 FINAL  Final  Culture, blood (routine x 2)     Status: None   Collection Time: 07/03/15  7:45 AM  Result Value Ref Range Status   Specimen Description BLOOD RIGHT ARM  Final   Special Requests BOTTLES DRAWN AEROBIC AND ANAEROBIC 10CC  Final   Culture NO GROWTH 5 DAYS  Final   Report Status 07/08/2015 FINAL  Final  Culture, blood (routine x 2)     Status: None   Collection Time: 07/03/15  7:50 AM  Result Value Ref Range Status   Specimen Description BLOOD RIGHT HAND  Final   Special Requests BOTTLES DRAWN AEROBIC AND ANAEROBIC 10CC  Final   Culture NO GROWTH 5 DAYS  Final   Report Status 07/08/2015 FINAL  Final     Labs: CBC:  Recent Labs Lab 07/06/15 0415 07/07/15 0658 07/08/15 0255 07/09/15 0423 07/10/15 0239  WBC 12.4* 10.8* 10.8* 10.4 9.8  HGB 11.6* 11.5* 11.8* 11.5* 11.2*  HCT 35.4* 35.3* 36.8* 34.9* 35.2*  MCV 92.2 92.9 93.2 93.3 92.9  PLT 383 388 371 328 292   Basic Metabolic Panel:  Recent Labs Lab  07/05/15 0625 07/06/15 0415 07/08/15 0255 07/09/15 0423 07/10/15 0239  NA 136 134* 137 136 137  K 4.5 4.7 4.1 4.0 4.1  CL 96* 97* 97* 96* 94*  CO2 25 24 28 29 29   GLUCOSE 153* 140* 169* 182* 173*  BUN 37* 39* 43* 43* 42*  CREATININE 0.91 0.80 0.79 0.75 0.82  CALCIUM 9.9 9.7 9.7 9.7 9.7  MG 2.0 2.1  --   --   --   PHOS  --   --  4.8* 4.3 4.6   Liver Function Tests:  Recent Labs Lab 07/05/15 0625 07/07/15 0658 07/08/15 0255 07/09/15 0423 07/10/15 0239  AST 69* 67*  --   --   --   ALT 103* 102*  --   --   --   ALKPHOS 106 109  --   --   --   BILITOT 0.5 0.6  --   --   --   PROT 7.8 7.6  --   --   --  ALBUMIN 3.0* 3.1* 3.2* 3.1* 3.1*   No results for input(s): LIPASE, AMYLASE in the last 168 hours. No results for input(s): AMMONIA in the last 168 hours. Cardiac Enzymes: No results for input(s): CKTOTAL, CKMB, CKMBINDEX, TROPONINI in the last 168 hours. BNP (last 3 results) No results for input(s): BNP in the last 8760 hours. CBG:  Recent Labs Lab 07/09/15 1159 07/09/15 1627 07/09/15 2315 07/10/15 0412 07/10/15 0728  GLUCAP 127* 134* 143* 173* 156*   Time spent: 30 minutes  Signed:  Maverick Patman  [facility]  07/10/2015  , 12:14 PM

## 2015-07-11 DIAGNOSIS — I5033 Acute on chronic diastolic (congestive) heart failure: Secondary | ICD-10-CM

## 2015-07-11 DIAGNOSIS — R1310 Dysphagia, unspecified: Secondary | ICD-10-CM

## 2015-07-11 LAB — GLUCOSE, CAPILLARY
GLUCOSE-CAPILLARY: 154 mg/dL — AB (ref 65–99)
GLUCOSE-CAPILLARY: 167 mg/dL — AB (ref 65–99)
GLUCOSE-CAPILLARY: 184 mg/dL — AB (ref 65–99)
Glucose-Capillary: 177 mg/dL — ABNORMAL HIGH (ref 65–99)
Glucose-Capillary: 167 mg/dL — ABNORMAL HIGH (ref 65–99)
Glucose-Capillary: 134 mg/dL — ABNORMAL HIGH (ref 65–99)
Glucose-Capillary: 156 mg/dL — ABNORMAL HIGH (ref 65–99)

## 2015-07-11 NOTE — Progress Notes (Signed)
[facility] PROGRESS NOTE  Gregory Parker ZOX:[id number] DOB: 03/27/1956 DOA: 06/04/2015  PCP: Isabella Stalling, MD  Brief HPI: Patient was admitted on 06/04/2015, with complaint of shaking episodes, was found to have status epilepticus, patient was intubated at the [facility] and transferred to [facility]. Patient was initially also started on broad-spectrum antibiotics with CNS coverage, but lumbar puncture was negative. His sputum grew Streptococcus and patient was treated with ceftriaxone for 8 days. Patient also developed rhabdomyolysis which improved with fluids. Initially was on dantrolene for spasticity. After discussing with patient's brother critical care decided to insert tracheostomy as well as PEG tube since the patient was not able to come off the ventilator. Tube feedings were initiated, and the patient was transferred to hospitalist service on 06/30/2015.  Past medical history:  Past Medical History  Diagnosis Date  . Alcohol abuse   . Stroke [facility])    Procedures:  Intubation and tracheostomy insertion PEG tube insertion EEG to 2/28, 3/06  Echocardiogram Study Conclusions - Left ventricle: The cavity size was normal. Wall thickness was normal. Systolic function was normal. The estimated ejection fraction was in the range of 60% to 65%. Wall motion was normal; there were no regional wall motion abnormalities. Doppler parameters are consistent with abnormal left ventricular relaxation (grade 1 diastolic dysfunction). - Aortic valve: There was no stenosis. - Mitral valve: Mildly calcified annulus. There was no significant regurgitation. - Right ventricle: The cavity size was normal. Systolic function was normal. - Pulmonary arteries: No complete TR doppler jet so unable to estimate PA systolic pressure. - Inferior vena cava: The vessel was normal in size. The respirophasic diameter changes were in the normal range (>= 50%), consistent with normal  central venous pressure. - Pericardium, extracardiac: Small, circumferential pericardial effusion.  Impressions: - Normal LV size and systolic function, EF 60-65%. Normal RV size and systolic function. No significant valvular abnormalities. Small circumferential pericardial effusion.  Consultants: Critical care, neurology, interventional radiology,  Antibiotics: Patient has completed courses of antibiotics.  Subjective: Patient is nonverbal. He has had a tracheostomy.  Objective: Vital Signs  Filed Vitals:   07/11/15 0739 07/11/15 0743 07/11/15 0800 07/11/15 1150  BP:   134/97   Pulse:   102   Temp:      TempSrc:      Resp:   28   Height:      Weight:      SpO2: 100% 100% 100% 99%    Intake/Output Summary (Last 24 hours) at 07/11/15 1200 Last data filed at 07/11/15 1100  Gross per 24 hour  Intake   1415 ml  Output   1725 ml  Net   -310 ml   Filed Weights   07/09/15 0420 07/10/15 0417 07/11/15 0500  Weight: 49.9 kg (110 lb 0.2 oz) 50.5 kg (111 lb 5.3 oz) 47.3 kg (104 lb 4.4 oz)    General appearance: Eyes open. Noncommunicative. Neck: Tracheostomy tube is noted Resp: Diminished air entry at the bases without any crackles or wheezing. Cardio: regular rate and rhythm, S1, S2 normal, no murmur, click, rub or gallop GI: soft, non-tender; bowel sounds normal; no masses,  no organomegaly. PEG tube is noted Extremities: extremities normal, atraumatic, no cyanosis or edema Neurologic: Nonverbal. Not moving any of his extremities.  Lab Results:  Basic Metabolic Panel:  Recent Labs Lab 07/05/15 0625 07/06/15 0415 07/08/15 0255 07/09/15 0423 07/10/15 0239  NA 136 134* 137 136 137  K 4.5 4.7 4.1 4.0 4.1  CL 96* 97* 97* 96* 94*  CO2 GLUCOSE 153* 140* 169* 182* 173*  BUN 37* 39* 43* 43* 42*  CREATININE 0.91 0.80 0.79 0.75 0.82  CALCIUM 9.9 9.7 9.7 9.7 9.7  MG 2.0 2.1  --   --   --   PHOS  --   --  4.8* 4.3 4.6   Liver Function Tests:  Recent  Labs Lab 07/05/15 0625 07/07/15 0658 07/08/15 0255 07/09/15 0423 07/10/15 0239  AST 69* 67*  --   --   --   ALT 103* 102*  --   --   --   ALKPHOS 106 109  --   --   --   BILITOT 0.5 0.6  --   --   --   PROT 7.8 7.6  --   --   --   ALBUMIN 3.0* 3.1* 3.2* 3.1* 3.1*   CBC:  Recent Labs Lab 07/06/15 0415 07/07/15 0658 07/08/15 0255 07/09/15 0423 07/10/15 0239  WBC 12.4* 10.8* 10.8* 10.4 9.8  HGB 11.6* 11.5* 11.8* 11.5* 11.2*  HCT 35.4* 35.3* 36.8* 34.9* 35.2*  MCV 92.2 92.9 93.2 93.3 92.9  PLT 383 388 371 328 292    CBG:  Recent Labs Lab 07/10/15 1615 07/10/15 2055 07/11/15 0119 07/11/15 0433 07/11/15 0734  GLUCAP 176* 154* 167* 177* 167*    Recent Results (from the past 240 hour(s))  Culture, respiratory (NON-Expectorated)     Status: None   Collection Time: 07/03/15  7:40 AM  Result Value Ref Range Status   Specimen Description TRACHEAL ASPIRATE  Final   Special Requests NONE  Final   Gram Stain   Final    ABUNDANT WBC PRESENT, PREDOMINANTLY PMN NO SQUAMOUS EPITHELIAL CELLS SEEN MODERATE GRAM POSITIVE COCCI IN PAIRS MODERATE GRAM NEGATIVE RODS FEW GRAM POSITIVE RODS Performed at [facility]    Culture   Final    NORMAL OROPHARYNGEAL FLORA Performed at [facility]    Report Status 07/05/2015 FINAL  Final  Culture, blood (routine x 2)     Status: None   Collection Time: 07/03/15  7:45 AM  Result Value Ref Range Status   Specimen Description BLOOD RIGHT ARM  Final   Special Requests BOTTLES DRAWN AEROBIC AND ANAEROBIC 10CC  Final   Culture NO GROWTH 5 DAYS  Final   Report Status 07/08/2015 FINAL  Final  Culture, blood (routine x 2)     Status: None   Collection Time: 07/03/15  7:50 AM  Result Value Ref Range Status   Specimen Description BLOOD RIGHT HAND  Final   Special Requests BOTTLES DRAWN AEROBIC AND ANAEROBIC 10CC  Final   Culture NO GROWTH 5 DAYS  Final   Report Status 07/08/2015 FINAL  Final      Studies/Results: No  results found.  Medications:  Scheduled: . aspirin  81 mg Per Tube Daily  . diltiazem  60 mg Per Tube 4 times per day  . famotidine  20 mg Per Tube BID  . fentaNYL  25 mcg Transdermal Q72H  . free water  50 mL Per Tube 3 times per day  . heparin  5,000 Units Subcutaneous 3 times per day  . insulin aspart  0-9 Units Subcutaneous TID WC  . levalbuterol  0.63 mg Nebulization Q6H  . nystatin  5 mL Oral QID  . scopolamine  1 patch Transdermal Q72H  . torsemide  30 mg Per Tube BID   Continuous: . feeding  supplement (VITAL 1.5 CAL) 1,000 mL (07/10/15 1019)   WUJ:[id number]:acetaminophen (TYLENOL) oral liquid 160 mg/5 mL, polyethylene glycol, senna-docusate  Assessment/Plan:  Principal Problem:   Status epilepticus (HCC) Active Problems:   Seizures (HCC)   Encounter for intubation   Alcohol abuse   Malignant hypertension   Cerebral thrombosis with cerebral infarction   Coma (HCC)   Alcohol withdrawal (HCC)   Pneumonia   Acute encephalopathy   Acute respiratory failure with hypoxemia (HCC)   Constipation   SEVERE Protein calorie malnutrition (HCC)   Tracheostomy care (HCC)   PEG (percutaneous endoscopic gastrostomy) status (HCC)   Dysphagia   Diastolic dysfunction with acute on chronic heart failure (HCC)    Status epilepticus Acute encephalopathy Neuroleptic malignant syndrome Patient presented with acute encephalopathy most up to secondary to alcohol withdrawal causing seizure. Patient was also suspected to have neuroleptic malignant syndrome during the hospitalization. Continues to remain noncommunicative, not following commands. Patient's baseline is unknown.  No significant meaningful recovery of neurological function from initial presentation. Thus making prognosis poor Patient may have sustained significant hypoxic brain damage during the initial event. Initially the patient was given IV Keppra which was discontinued on 06/08/2015 since there was no evidence of seizure and  multiple as well as continuous EEG was unremarkable for any epileptogenic foci. Currently status post trach and PEG.  Acute respiratory failure with hypoxia Status post tracheostomy. Pneumococcal pneumonia. Excessive tracheal secretion. Recurrent aspiration  Acute on chronic diastolic dysfunction with CHF and volume overload Tracheal culture is negative for any growth even though the Gram stain was positive. No antibiotics currently. Blood cultures are negative performed on 07/03/2015. Interventional radiology does not feel to the PEG tube needs to be repositioned Continue aspiration precautions for the patient. Use scopolamine patch. Steroids has been tapered off Continue torsemide 30 mg twice a day can increase as and when needed. We'll continue Foley catheter to monitor ins and outs. Although it mentions that the patient is 12 L positive clinical examination the patient does not appear significantly volume overloaded. Chest x-ray also does not show any evidence of significant congestion. Oxygenation is also improving.  Essential hypertension. Continue Cardizem at present.Continue Lasix.  Constipation. Currently resolved  Changing Senokot as needed continue MiraLAX scheduled  Spasticity with rhabdomyolysis. Peak CK 5000. Improved with IV dantrolene. Currently renal function stable. We'll monitor.  Acute left basal ganglia lacunar infarct. Neurology consulted. Continue aspirin Echocardiogram shows ejection fraction 60-65%. CTA head and neck unremarkable. Follow-up with neurology  Alcohol abuse. Continue B1 and folic acid  Severe Protein calorie malnutrition. Continue tube feeding. Tolerating well at reduced rate  Pain management. patient was on fentanyl drip in the ICU and has been transferred to step down with fentanyl patch 50 g, patient was not on any narcotic medications prior to admission therefore we will gradually reduce the dose.  Change 25 g daily, and  taper it off completely as tolerated over the next 20 days.  DVT Prophylaxis: Subcutaneous heparin    Code Status: Full code  Family Communication: No family at bedside  Disposition Plan: Patient remains medically stable. Disposition has been a challenge. Await placement to a skilled nursing facility. Apparently over the last many days excessive secretions have been a major issue and a limiting factor. However, it appears that the secretions may be decreased. Patient has been placed on scopolamine patch.    LOS: 37 days   [facility] Regional Medical CenterKRISHNAN,Jibril Mcminn  [facility] Pager [phone] 07/11/2015, 12:00 PM  If 7PM-7AM, please contact night-coverage at  [email or web address], password [facility]

## 2015-07-12 DIAGNOSIS — E46 Unspecified protein-calorie malnutrition: Secondary | ICD-10-CM

## 2015-07-12 LAB — GLUCOSE, CAPILLARY
GLUCOSE-CAPILLARY: 173 mg/dL — AB (ref 65–99)
Glucose-Capillary: 167 mg/dL — ABNORMAL HIGH (ref 65–99)
Glucose-Capillary: 199 mg/dL — ABNORMAL HIGH (ref 65–99)
Glucose-Capillary: 183 mg/dL — ABNORMAL HIGH (ref 65–99)

## 2015-07-12 MED ORDER — SCOPOLAMINE 1 MG/3DAYS TD PT72: 1.0000 | MEDICATED_PATCH | TRANSDERMAL | Status: AC

## 2015-07-12 MED ORDER — FENTANYL 25 MCG/HR TD PT72: 25.0000 ug | MEDICATED_PATCH | TRANSDERMAL | Status: AC

## 2015-07-12 MED ORDER — LEVALBUTEROL HCL 0.63 MG/3ML IN NEBU: 0.6300 mg | INHALATION_SOLUTION | Freq: Three times a day (TID) | RESPIRATORY_TRACT | Status: DC

## 2015-07-12 MED ORDER — DILTIAZEM 12 MG/ML ORAL SUSPENSION: 60.0000 mg | Freq: Four times a day (QID) | ORAL | Status: AC

## 2015-07-12 MED ORDER — LEVALBUTEROL HCL 0.63 MG/3ML IN NEBU
0.6300 mg | INHALATION_SOLUTION | Freq: Three times a day (TID) | RESPIRATORY_TRACT | Status: AC
Start: 2015-07-12 — End: ?

## 2015-07-12 NOTE — Progress Notes (Signed)
Speech Pathology:  Following chart for readiness for PMV eval - pt with limited responsiveness, no attempts to communicate.  Will continue to follow chart.   Antoni Stefan L. Samson Fredericouture, [place] CCC/SLP Pager [phone]

## 2015-07-12 NOTE — Progress Notes (Signed)
[facility]

## 2015-07-12 NOTE — Progress Notes (Signed)
[facility] PROGRESS NOTE  Gregory CaterClay Cohill JXB:[id number] DOB: 1955-06-30 DOA: 06/04/2015  PCP: Isabella StallingNDIEGO,RICHARD M, MD  Brief HPI: Patient was admitted on 06/04/2015, with complaint of shaking episodes, was found to have status epilepticus, patient was [facility] at the [facility] and [facility]. Patient was initially also started on broad-spectrum antibiotics with CNS coverage, but lumbar puncture was negative. His sputum grew Streptococcus and patient was treated with ceftriaxone for 8 days. Patient also developed rhabdomyolysis which improved with fluids. Initially was on dantrolene for spasticity. After discussing with patient's brother critical care decided to insert tracheostomy as well as PEG tube since the patient was not able to come off the ventilator. Tube feedings were initiated, and the patient was transferred to hospitalist service on 06/30/2015.  Past medical history:  Past Medical History  Diagnosis Date  . Alcohol abuse   . Stroke [facility](HCC)    Procedures:  Intubation and tracheostomy insertion PEG tube insertion EEG 2/28, 3/06  Echocardiogram Study Conclusions - Left ventricle: The cavity size was normal. Wall thickness was normal. Systolic function was normal. The estimated ejection fraction was in the range of 60% to 65%. Wall motion was normal; there were no regional wall motion abnormalities. Doppler parameters are consistent with abnormal left ventricular relaxation (grade 1 diastolic dysfunction). - Aortic valve: There was no stenosis. - Mitral valve: Mildly calcified annulus. There was no significant regurgitation. - Right ventricle: The cavity size was normal. Systolic function was normal. - Pulmonary arteries: No complete TR doppler jet so unable to estimate PA systolic pressure. - Inferior vena cava: The vessel was normal in size. The respirophasic diameter changes were in the normal range (>= 50%), consistent with normal central  venous pressure. - Pericardium, extracardiac: Small, circumferential pericardial effusion. Impressions: - Normal LV size and systolic function, EF 60-65%. Normal RV size and systolic function. No significant valvular abnormalities. Small circumferential pericardial effusion.  Consultants: Critical care, neurology, interventional radiology,  Antibiotics: Patient has completed courses of antibiotics.  Subjective: Patient is nonverbal. He has a tracheostomy tube in place.  Objective: Vital Signs  Filed Vitals:   07/12/15 0500 07/12/15 0530 07/12/15 0531 07/12/15 0717  BP:   139/96   Pulse:   110   Temp:      TempSrc:      Resp:   31   Height:      Weight: 47.3 kg (104 lb 4.4 oz)     SpO2:  100% 100% 100%    Intake/Output Summary (Last 24 hours) at 07/12/15 0718 Last data filed at 07/12/15 0615  Gross per 24 hour  Intake   1365 ml  Output   1250 ml  Net    115 ml   Filed Weights   07/10/15 0417 07/11/15 0500 07/12/15 0500  Weight: 50.5 kg (111 lb 5.3 oz) 47.3 kg (104 lb 4.4 oz) 47.3 kg (104 lb 4.4 oz)    General appearance: Eyes open. Noncommunicative. Neck: Tracheostomy tube is noted Resp: Diminished air entry at the bases without any crackles or wheezing. Cardio: regular rate and rhythm, S1, S2 normal, no murmur, click, rub or gallop GI: soft, non-tender; bowel sounds normal; no masses,  no organomegaly. PEG tube is noted Neurologic: Nonverbal. Not moving any of his extremities.  Lab Results:  Basic Metabolic Panel:  Recent Labs Lab 07/06/15 0415 07/08/15 0255 07/09/15 0423 07/10/15 0239  NA 134* 137 136 137  K 4.7 4.1 4.0 4.1  CL 97* 97* 96* 94*  CO2 GLUCOSE 140* 169* 182* 173*  BUN 39* 43* 43* 42*  CREATININE 0.80 0.79 0.75 0.82  CALCIUM 9.7 9.7 9.7 9.7  MG 2.1  --   --   --   PHOS  --  4.8* 4.3 4.6   Liver Function Tests:  Recent Labs Lab 07/07/15 0658 07/08/15 0255 07/09/15 0423 07/10/15 0239  AST 67*  --   --   --   ALT 102*   --   --   --   ALKPHOS 109  --   --   --   BILITOT 0.6  --   --   --   PROT 7.6  --   --   --   ALBUMIN 3.1* 3.2* 3.1* 3.1*   CBC:  Recent Labs Lab 07/06/15 0415 07/07/15 0658 07/08/15 0255 07/09/15 0423 07/10/15 0239  WBC 12.4* 10.8* 10.8* 10.4 9.8  HGB 11.6* 11.5* 11.8* 11.5* 11.2*  HCT 35.4* 35.3* 36.8* 34.9* 35.2*  MCV 92.2 92.9 93.2 93.3 92.9  PLT 383 388 371 328 292    CBG:  Recent Labs Lab 07/11/15 1157 07/11/15 1708 07/11/15 1951 07/11/15 2347 07/12/15 0457  GLUCAP 134* 184* 156* 167* 173*    Recent Results (from the past 240 hour(s))  Culture, respiratory (NON-Expectorated)     Status: None   Collection Time: 07/03/15  7:40 AM  Result Value Ref Range Status   Specimen Description TRACHEAL ASPIRATE  Final   Special Requests NONE  Final   Gram Stain   Final    ABUNDANT WBC PRESENT, PREDOMINANTLY PMN NO SQUAMOUS EPITHELIAL CELLS SEEN MODERATE GRAM POSITIVE COCCI IN PAIRS MODERATE GRAM NEGATIVE RODS FEW GRAM POSITIVE RODS Performed at [facility]    Culture   Final    NORMAL OROPHARYNGEAL FLORA Performed at [facility]    Report Status 07/05/2015 FINAL  Final  Culture, blood (routine x 2)     Status: None   Collection Time: 07/03/15  7:45 AM  Result Value Ref Range Status   Specimen Description BLOOD RIGHT ARM  Final   Special Requests BOTTLES DRAWN AEROBIC AND ANAEROBIC 10CC  Final   Culture NO GROWTH 5 DAYS  Final   Report Status 07/08/2015 FINAL  Final  Culture, blood (routine x 2)     Status: None   Collection Time: 07/03/15  7:50 AM  Result Value Ref Range Status   Specimen Description BLOOD RIGHT HAND  Final   Special Requests BOTTLES DRAWN AEROBIC AND ANAEROBIC 10CC  Final   Culture NO GROWTH 5 DAYS  Final   Report Status 07/08/2015 FINAL  Final      Studies/Results: No results found.  Medications:  Scheduled: . aspirin  81 mg Per Tube Daily  . diltiazem  60 mg Per Tube 4 times per day  . famotidine  20 mg Per  Tube BID  . fentaNYL  25 mcg Transdermal Q72H  . free water  50 mL Per Tube 3 times per day  . heparin  5,000 Units Subcutaneous 3 times per day  . insulin aspart  0-9 Units Subcutaneous TID WC  . levalbuterol  0.63 mg Nebulization Q6H  . nystatin  5 mL Oral QID  . scopolamine  1 patch Transdermal Q72H  . torsemide  30 mg Per Tube BID   Continuous: . feeding supplement (VITAL 1.5 CAL) 1,000 mL (07/12/15 0551)   EAV:[id number] (TYLENOL) oral liquid 160 mg/5 mL, polyethylene glycol, senna-docusate  Assessment/Plan:  Principal  Problem:   Status epilepticus (HCC) Active Problems:   Seizures (HCC)   Encounter for intubation   Alcohol abuse   Malignant hypertension   Cerebral thrombosis with cerebral infarction   Coma (HCC)   Alcohol withdrawal (HCC)   Pneumonia   Acute encephalopathy   Acute respiratory failure with hypoxemia (HCC)   Constipation   SEVERE Protein calorie malnutrition (HCC)   Tracheostomy care (HCC)   PEG (percutaneous endoscopic gastrostomy) status (HCC)   Dysphagia   Diastolic dysfunction with acute on chronic heart failure (HCC)    Status epilepticus Acute encephalopathy Neuroleptic malignant syndrome Patient presented with acute encephalopathy most up to secondary to alcohol withdrawal causing seizure. Patient was also suspected to have neuroleptic malignant syndrome during the hospitalization. Continues to remain noncommunicative, not following commands. Patient's baseline is unknown.  No significant meaningful recovery of neurological function from initial presentation. Thus making prognosis poor Patient may have sustained significant hypoxic brain damage during the initial event. Initially the patient was given IV Keppra which was discontinued on 06/08/2015 since there was no evidence of seizure and multiple as well as continuous EEG was unremarkable for any epileptogenic foci. Currently status post trach and PEG.  Acute respiratory failure with  hypoxia Status post tracheostomy. Pneumococcal pneumonia. Excessive tracheal secretion. Recurrent aspiration  Acute on chronic diastolic dysfunction with CHF and volume overload Tracheal culture is negative for any growth even though the Gram stain was positive. No antibiotics currently. Blood cultures are negative performed on 07/03/2015. Interventional radiology does not feel to the PEG tube needs to be repositioned Continue aspiration precautions for the patient. Use scopolamine patch. Steroids has been tapered off Continue torsemide 30 mg twice a day. Can increase as and when needed. We'll continue Foley catheter to monitor ins and outs. Patient does not appear significantly volume overloaded. Chest x-ray also does not show any evidence of significant congestion. Oxygenation is also improving. Secretions have improved per nursing reports, as well as RT.  Essential hypertension. Continue Cardizem at present.   Constipation. Currently resolved  Changing Senokot as needed continue MiraLAX scheduled  Spasticity with rhabdomyolysis. Peak CK 5000. Improved with IV dantrolene. Currently renal function stable. We'll monitor.  Acute left basal ganglia lacunar infarct. Neurology consulted. Continue aspirin Echocardiogram shows ejection fraction 60-65%. CTA head and neck unremarkable. Follow-up with neurology  Alcohol abuse. Continue B1 and folic acid  Severe Protein calorie malnutrition. Continue tube feeding. Tolerating well at reduced rate  Pain management. Patient was on fentanyl drip in the ICU and has been transferred to step down with fentanyl patch 50 g, patient was not on any narcotic medications prior to admission therefore we will gradually reduce the dose.  Change 25 g daily, and taper it off completely as tolerated over the next many days.  DVT Prophylaxis: Subcutaneous heparin    Code Status: Full code  Family Communication: No family at bedside    Disposition Plan: Patient remains medically stable. Disposition has been a challenge. Await placement to a skilled nursing facility. Secretions have improved and decreased in amount. Patient has been placed on scopolamine patch.    LOS: 38 days   [facility]  [facility] Pager [phone] 07/12/2015, 7:18 AM  If 7PM-7AM, please contact night-coverage at [email or web address], password [facility]

## 2015-07-12 NOTE — Discharge Summary (Signed)
[facility]  Physician Discharge Summary   Patient ID: Gregory Parker MRN: [id number] DOB/AGE: 62-Nov-1957 62 y.o.  Admit date: 06/04/2015 Discharge date: 07/12/2015  PCP: Isabella Stalling, MD  DISCHARGE DIAGNOSES:  Principal Problem:   Status epilepticus [facility]) Active Problems:   Seizures (HCC)   Encounter for intubation   Alcohol abuse   Malignant hypertension   Cerebral thrombosis with cerebral infarction   Coma (HCC)   Alcohol withdrawal (HCC)   Pneumonia   Acute encephalopathy   Acute respiratory failure with hypoxemia (HCC)   Constipation   SEVERE Protein calorie malnutrition (HCC)   Tracheostomy care (HCC)   PEG (percutaneous endoscopic gastrostomy) status (HCC)   Dysphagia   Diastolic dysfunction with acute on chronic heart failure (HCC)   RECOMMENDATIONS FOR OUTPATIENT FOLLOW UP: 1. CBC and BMET in 1 week. 2. Trach and PEG care.   DISCHARGE CONDITION: fair  Diet recommendation: Tube feedings  Filed Weights   07/10/15 0417 07/11/15 0500 07/12/15 0500  Weight: 50.5 kg (111 lb 5.3 oz) 47.3 kg (104 lb 4.4 oz) 47.3 kg (104 lb 4.4 oz)    INITIAL HISTORY: Patient was admitted on 06/04/2015, with complaint of shaking episodes, was found to have status epilepticus, patient was intubated at the [facility] and transferred to [facility]. Patient was initially also started on broad-spectrum antibiotics with CNS coverage, but lumbar puncture was negative. His sputum grew Streptococcus and patient was treated with ceftriaxone for 8 days. Patient also developed rhabdomyolysis which improved with fluids. Initially was on dantrolene for spasticity. After discussing with patient's brother critical care decided to insert tracheostomy as well as PEG tube since the patient was not able to come off the ventilator. Tube feedings were initiated, and the patient was transferred to hospitalist service on 06/30/2015.  Consultations: Critical care, neurology,  interventional radiology,  Procedures: Intubation and tracheostomy insertion PEG tube insertion EEG 2/28, 3/06  Echocardiogram Study Conclusions - Left ventricle: The cavity size was normal. Wall thickness was normal. Systolic function was normal. The estimated ejection fraction was in the range of 60% to 65%. Wall motion was normal; there were no regional wall motion abnormalities. Doppler parameters are consistent with abnormal left ventricular relaxation (grade 1 diastolic dysfunction). - Aortic valve: There was no stenosis. - Mitral valve: Mildly calcified annulus. There was no significant regurgitation. - Right ventricle: The cavity size was normal. Systolic function was normal. - Pulmonary arteries: No complete TR doppler jet so unable to estimate PA systolic pressure. - Inferior vena cava: The vessel was normal in size. The respirophasic diameter changes were in the normal range (>= 50%), consistent with normal central venous pressure. - Pericardium, extracardiac: Small, circumferential pericardial effusion. Impressions: - Normal LV size and systolic function, EF 60-65%. Normal RV size and systolic function. No significant valvular abnormalities. Small circumferential pericardial effusion.  HOSPITAL COURSE:   Status epilepticus Acute encephalopathy Neuroleptic malignant syndrome Patient presented with acute encephalopathy most up to secondary to alcohol withdrawal causing seizure. Patient was also suspected to have neuroleptic malignant syndrome during the hospitalization. Continues to remain noncommunicative, not following commands. Patient's baseline is unknown.  No significant meaningful recovery of neurological function from initial presentation. Thus making prognosis poor Patient may have sustained significant hypoxic brain damage during the initial event. Initially the patient was given IV Keppra which was discontinued on 06/08/2015 since there was no evidence of seizure  and multiple as well as continuous EEG was unremarkable for any epileptogenic foci. Currently status post trach and  PEG.  Acute respiratory failure with hypoxia Status post tracheostomy. Pneumococcal pneumonia. Excessive tracheal secretion. Recurrent aspiration  Acute on chronic diastolic dysfunction with CHF and volume overload Tracheal culture is negative for any growth even though the Gram stain was positive. No antibiotics currently. He has completed courses of antibiotics. Blood cultures are negative performed on 07/03/2015. Interventional radiology does not feel to the PEG tube needs to be repositioned Continue aspiration precautions for the patient. Use scopolamine patch. Steroids has been tapered off Continue torsemide 30 mg twice a day. Can increase as and when needed. We'll continue Foley catheter to monitor ins and outs. Patient does not appear significantly volume overloaded. Chest x-ray also does not show any evidence of significant congestion. Oxygenation is also improving. Secretions have improved per nursing reports, as well as RT.  Essential hypertension. Continue Cardizem at present.   Constipation. Currently resolved  Changing Senokot as needed continue MiraLAX scheduled  Spasticity with rhabdomyolysis. Peak CK 5000. Improved with IV dantrolene. Currently renal function stable.  Acute left basal ganglia lacunar infarct. Neurology was consulted. Continue aspirin Echocardiogram shows ejection fraction 60-65%. CTA head and neck unremarkable. Follow-up with neurology  Alcohol abuse. Continue B1 and folic acid  Severe Protein calorie malnutrition. Continue tube feeding. Tolerating well at reduced rate 34ml/hr.  Pain management. Patient was on fentanyl drip in the ICU and has been transferred to step down with fentanyl patch 50 g, patient was not on any narcotic medications prior to admission therefore we will gradually reduce the dose. Changed to 25 g  daily, and taper it off completely as tolerated over the next many days.  Overall stable. Has bed in SNF today. Ok for discharge.   PERTINENT LABS:  The results of significant diagnostics from this hospitalization (including imaging, microbiology, ancillary and laboratory) are listed below for reference.    Microbiology: Recent Results (from the past 240 hour(s))  Culture, respiratory (NON-Expectorated)     Status: None   Collection Time: 07/03/15  7:40 AM  Result Value Ref Range Status   Specimen Description TRACHEAL ASPIRATE  Final   Special Requests NONE  Final   Gram Stain   Final    ABUNDANT WBC PRESENT, PREDOMINANTLY PMN NO SQUAMOUS EPITHELIAL CELLS SEEN MODERATE GRAM POSITIVE COCCI IN PAIRS MODERATE GRAM NEGATIVE RODS FEW GRAM POSITIVE RODS Performed at [facility]    Culture   Final    NORMAL OROPHARYNGEAL FLORA Performed at [facility]    Report Status 07/05/2015 FINAL  Final  Culture, blood (routine x 2)     Status: None   Collection Time: 07/03/15  7:45 AM  Result Value Ref Range Status   Specimen Description BLOOD RIGHT ARM  Final   Special Requests BOTTLES DRAWN AEROBIC AND ANAEROBIC 10CC  Final   Culture NO GROWTH 5 DAYS  Final   Report Status 07/08/2015 FINAL  Final  Culture, blood (routine x 2)     Status: None   Collection Time: 07/03/15  7:50 AM  Result Value Ref Range Status   Specimen Description BLOOD RIGHT HAND  Final   Special Requests BOTTLES DRAWN AEROBIC AND ANAEROBIC 10CC  Final   Culture NO GROWTH 5 DAYS  Final   Report Status 07/08/2015 FINAL  Final     Labs: Basic Metabolic Panel:  Recent Labs Lab 07/06/15 0415 07/08/15 0255 07/09/15 0423 07/10/15 0239  NA 134* 137 136 137  K 4.7 4.1 4.0 4.1  CL 97* 97* 96* 94*  CO2 24 28  29 29  GLUCOSE 140* 169* 182* 173*  BUN 39* 43* 43* 42*  CREATININE 0.80 0.79 0.75 0.82  CALCIUM 9.7 9.7 9.7 9.7  MG 2.1  --   --   --   PHOS  --  4.8* 4.3 4.6   Liver Function  Tests:  Recent Labs Lab 07/07/15 0658 07/08/15 0255 07/09/15 0423 07/10/15 0239  AST 67*  --   --   --   ALT 102*  --   --   --   ALKPHOS 109  --   --   --   BILITOT 0.6  --   --   --   PROT 7.6  --   --   --   ALBUMIN 3.1* 3.2* 3.1* 3.1*   CBC:  Recent Labs Lab 07/06/15 0415 07/07/15 0658 07/08/15 0255 07/09/15 0423 07/10/15 0239  WBC 12.4* 10.8* 10.8* 10.4 9.8  HGB 11.6* 11.5* 11.8* 11.5* 11.2*  HCT 35.4* 35.3* 36.8* 34.9* 35.2*  MCV 92.2 92.9 93.2 93.3 92.9  PLT 383 388 371 328 292    CBG:  Recent Labs Lab 07/11/15 1708 07/11/15 1951 07/11/15 2347 07/12/15 0457 07/12/15 0757  GLUCAP 184* 156* 167* 173* 199*     IMAGING STUDIES Ct Abdomen Wo Contrast  06/20/2015  CLINICAL DATA:  60 year old male with a history of dysphagia. EXAM: CT ABDOMEN WITHOUT CONTRAST TECHNIQUE: Multidetector CT imaging of the abdomen was performed following the standard protocol without IV contrast. COMPARISON:  None. FINDINGS: Lower chest: Edema/ anasarca of the lower chest wall soft tissues. Adenopathy along the left chest wall. Minimal gynecomastia. Bilateral small pleural effusions with associated atelectasis. Gastric tube within the esophagus, terminating within the stomach. Heart size within normal limits without significant pericardial fluid/ thickening. Abdomen/pelvis: Unremarkable appearance of the liver. Gallbladder is contracted with no radiopaque stones. No pericholecystic fluid. Unremarkable spleen. Unremarkable bilateral adrenal glands. Unremarkable pancreas. Visualized colon and small bowel unremarkable, with no abnormal distention. Similar to the chest wall there is vague infiltration of the abdominal mesenteric. No significant ascites. No hydronephrosis or nephrolithiasis of the visualized kidneys. Hypodense structure or on the lateral cortex of the right kidney and the inferior cortex of the right kidney, both with low Hounsfield units. Superior measures 1.5 cm. Inferior  measures 1.4 cm. Scattered vascular calcifications. Degenerative changes of the spine. No acute fracture. Multiple endplate irregularities compatible with Schmorl's nodes. Vacuum disc phenomenon of the mid lumbar spine. No significant bony canal narrowing. IMPRESSION: No acute intra abdominal process. Bilateral pleural effusions and atelectasis. Body wall and abdominal edema/ anasarca. Recommend correlation with fluid status. Right-sided low-density kidney lesions, most likely benign cysts though not fully characterized by noncontrast CT. Signed, Yvone NeuJaime S. Loreta AveWagner, DO [facility] Electronically Signed   By: Gilmer MorJaime  Wagner D.O.   On: 06/20/2015 07:07   Ir Gastrostomy Tube Mod Sed  06/26/2015  INDICATION: History of stroke, now with dysphagia. Please perform percutaneous gastrostomy tube placement for enteric nutrition supplementation EXAM: PUSH GASTROSTOMY TUBE PLACEMENT COMPARISON:  CT abdomen pelvis - 06/19/2025 MEDICATIONS: Ancef 2 gm IV; Antibiotics were administered within 1 hour of the procedure. CONTRAST:  50 mL of Isovue 300 administered into the gastric lumen. ANESTHESIA/SEDATION: Moderate (conscious) sedation was employed during this procedure. A total of Versed 2 mg and Fentanyl 100 mcg was administered intravenously. Moderate Sedation Time: 25 minutes. The patient's level of consciousness and vital signs were monitored continuously by radiology nursing throughout the procedure under my direct supervision. FLUOROSCOPY TIME:  10 minutes 42 seconds (140 mGy) COMPLICATIONS: None immediate. PROCEDURE: Informed written consent was obtained from the patient's family following explanation of the procedure, risks, benefits and alternatives. A time out was performed prior to the initiation of the procedure. Ultrasound scanning was performed to demarcate the edge of the left lobe of the liver. Maximal barrier sterile technique utilized including caps, mask,  sterile gowns, sterile gloves, large sterile drape, hand hygiene and Betadine prep. The left upper quadrant was sterilely prepped and draped. A oral gastric catheter was inserted into the stomach under fluoroscopy. The existing nasogastric feeding tube was removed. The left costal margin and air opacified transverse colon were identified and avoided. Air was injected into the stomach for insufflation and visualization under fluoroscopy. Under sterile conditions and local anesthesia, 3 T tacks were utilized to pexy the anterior aspect of the stomach against the ventral abdominal wall. Contrast injection confirmed appropriate positioning of each of the T tacks. An incision was made between the T tacks and a 17 gauge trocar needle was utilized to access the stomach. Needle position was confirmed within the stomach with aspiration of air and injection of a small amount of contrast. A stiff Glidewire was advanced into the gastric lumen and under intermittent fluoroscopic guidance, the access needle was exchanged for a Kumpe catheter. With the use of the Kumpe catheter, attempts were made to advance a stiff Glidewire into duodenum, however this ultimately proved unsuccessful. Under intermittent fluoroscopic guidance, the Kumpe catheter was exchanged for serial dilators, ultimately allowing placement of a 20 French peel-away sheath. Under intermittent fluoroscopic guidance, a 18-French balloon retention gastrostomy tube was inserted through the peel-away sheath. The retention balloon was insufflated with a mixture of dilute saline and contrast and pulled taut against the anterior wall of the stomach. The external disc was cinched. Contrast injection confirms positioning within the stomach. Several spot radiographic images were obtained in various obliquities for documentation. T he patient tolerated procedure well without immediate post procedural complication. FINDINGS: After successful fluoroscopic guided placement, the  gastrostomy tube is appropriately positioned with internal retention balloon against the ventral aspect of the gastric lumen. IMPRESSION: Successful fluoroscopic insertion of an 31 French balloon retention gastrostomy tube. The gastrostomy may be used immediately for medication administration and in 24 hrs for the initiation of feeds. Electronically Signed   By: Simonne Come M.D.   On: 06/26/2015 11:03   Dg Chest Port 1 View  07/04/2015  CLINICAL DATA:  Possible aspiration EXAM: PORTABLE CHEST 1 VIEW COMPARISON:  07/03/2015 FINDINGS: Tracheostomy tube is again identified. The cardiac shadow is stable. The lungs are well aerated bilaterally. Interstitial changes seen on the prior exam are again noted and stable. No new focal infiltrate is seen. No bony abnormality is noted. IMPRESSION: Stable appearance of the chest when compared with the previous day. Electronically Signed   By: Alcide Clever M.D.   On: 07/04/2015 12:36   Dg Chest Port 1 View  07/03/2015  CLINICAL DATA:  Respiratory distress EXAM: PORTABLE CHEST 1 VIEW COMPARISON:  June 30, 2015 FINDINGS: The heart size and mediastinal contours are within normal limits. Tracheostomy tube is unchanged. There is mild interstitial edema. There is no focal pneumonia or pleural effusion. The visualized skeletal structures are unremarkable. IMPRESSION: Mild interstitial edema. Electronically Signed   By: Sherian Rein M.D.   On: 07/03/2015 14:56   Dg Chest Port 1 View  06/30/2015  CLINICAL DATA:  Fever EXAM: PORTABLE CHEST 1 VIEW COMPARISON:  06/23/2015 FINDINGS: Cardiomediastinal  silhouette is stable. Tracheostomy tube unchanged in position. Again noted bilateral patchy airspace disease bilaterally and streaky interstitial prominence suspicious for multifocal pneumonia. Superimposed mild interstitial edema cannot be excluded. Clinical correlation is necessary. IMPRESSION: Again noted bilateral patchy airspace disease bilaterally and streaky interstitial  prominence suspicious for multifocal pneumonia. Superimposed mild interstitial edema cannot be excluded. Clinical correlation is necessary. Electronically Signed   By: Natasha Mead M.D.   On: 06/30/2015 15:39   Dg Chest Port 1 View  06/23/2015  CLINICAL DATA:  Respiratory failure EXAM: PORTABLE CHEST 1 VIEW COMPARISON:  06/21/2015 chest radiograph. FINDINGS: Tracheostomy tube tip overlies the tracheal air column at the thoracic inlet. Enteric tube enters the stomach with the tip not seen on this image. Stable cardiomediastinal silhouette with top-normal heart size. No pneumothorax. Probable trace left pleural effusion. There is severe patchy and linear opacities in the bilateral upper lungs greater than lower lungs, slightly worsened. IMPRESSION: 1. Slight worsening of severe patchy and linear opacities in the upper greater than lower lungs bilaterally. Differential includes pulmonary edema and/or multifocal pneumonia. 2. Probable trace left pleural effusion. Electronically Signed   By: Delbert Phenix M.D.   On: 06/23/2015 16:15   Dg Chest Port 1 View  06/21/2015  CLINICAL DATA:  Acute respiratory failure EXAM: PORTABLE CHEST 1 VIEW COMPARISON:  06/19/2015 FINDINGS: Tracheostomy remains in good position.  Gastric tube in the stomach. Diffuse bilateral airspace disease right greater than left is unchanged. No effusion. Mild atelectasis in the lung bases. IMPRESSION: Bilateral airspace disease unchanged. Support lines remain in satisfactory position. Electronically Signed   By: Marlan Palau M.D.   On: 06/21/2015 07:47   Dg Chest Port 1 View  06/19/2015  CLINICAL DATA:  Acute respiratory failure EXAM: PORTABLE CHEST 1 VIEW COMPARISON:  06/18/2015 FINDINGS: Cardiac shadow is stable. A tracheostomy tube and nasogastric catheter are noted in satisfactory position. Bilateral pulmonary infiltrates are noted relatively similar to that seen on the prior exam with the exception of some improved aeration in the right  upper lobe. IMPRESSION: Diffuse bilateral infiltrates. Electronically Signed   By: Alcide Clever M.D.   On: 06/19/2015 08:15   Dg Chest Port 1 View  06/18/2015  CLINICAL DATA:  Patient with respiratory failure. EXAM: PORTABLE CHEST 1 VIEW COMPARISON:  Chest radiograph 06/17/2015. FINDINGS: Tracheostomy tube stable in position. Enteric tube tip and side-port project over the stomach. Grossly unchanged diffuse bilateral airspace pulmonary opacities. No pleural effusion or pneumothorax. Regional skeleton is unremarkable. IMPRESSION: Grossly unchanged diffuse bilateral airspace opacities. Considerations include multi focal pneumonia, edema or hemorrhage. Electronically Signed   By: Annia Belt M.D.   On: 06/18/2015 07:57   Dg Chest Port 1 View  06/17/2015  CLINICAL DATA:  Respiratory failure EXAM: PORTABLE CHEST 1 VIEW COMPARISON:  06/16/2015 FINDINGS: Tracheostomy and NG tube are unchanged. Bilateral airspace opacities again noted, most confluent in the right upper lobe. Airspace disease slightly worsened since prior study. No effusions or acute bony abnormality. Heart is normal size. IMPRESSION: Bilateral airspace disease, slightly worsened since prior study. Electronically Signed   By: Charlett Nose M.D.   On: 06/17/2015 10:15   Dg Chest Port 1 View  06/16/2015  CLINICAL DATA:  Shortness of breath EXAM: PORTABLE CHEST 1 VIEW COMPARISON:  June 15, 2015 FINDINGS: An NG tube is been inserted in the interval with the distal tip in the left upper quadrant. The tracheostomy tube is in good position. No pneumothorax. Bilateral diffuse pulmonary opacities are mildly worsened in the  interval. The opacity is a little more focal in the right upper lobe on today's study which is a new finding. IMPRESSION: Worsening pulmonary opacities, now more focal in the right upper lobe. Recommend clinical correlation and follow-up to resolution. Electronically Signed   By: Gerome Sam III M.D   On: 06/16/2015 08:17   Dg Chest  Port 1 View  06/15/2015  CLINICAL DATA:  Newly placed tracheostomy tube. EXAM: PORTABLE CHEST - 1 VIEW COMPARISON:  One-view chest x-ray from the same day. FINDINGS: A tracheostomy tube is now in place. The endotracheal tube and NG tube have been removed. The heart size is normal. Mild interstitial coarsening a is again noted. Overall aeration is improved. The visualized soft tissues and bony thorax are unremarkable. IMPRESSION: 1. Interval placement of tracheostomy tube without radiographic evidence for complication. 2. Improved aeration bilaterally. 3. Mild interstitial coarsening remains. Electronically Signed   By: Marin Roberts M.D.   On: 06/15/2015 15:06   Dg Chest Port 1 View  06/15/2015  CLINICAL DATA:  Acute respiratory failure, status epilepticus, alcoholic withdrawal, acute encephalopathy with cerebral infarction. EXAM: PORTABLE CHEST 1 VIEW COMPARISON:  Portable chest x-ray of June 13, 2015 FINDINGS: The lungs are adequately inflated. There are persistent confluent alveolar opacities bilaterally. There is no pneumothorax or pleural effusion. The heart is normal in size. The pulmonary vascularity is not clearly engorged. The endotracheal tube tip lies 4.5 cm above the carina. The esophagogastric tube tip projects below the inferior margin of the image. IMPRESSION: Stable bilateral airspace opacities. No pleural effusion or pneumothorax. The support tubes are in stable position. Electronically Signed   By: David  [place] M.D.   On: 06/15/2015 08:04   Dg Chest Port 1 View  06/13/2015  CLINICAL DATA:  Acute respiratory failure EXAM: PORTABLE CHEST 1 VIEW COMPARISON:  06/12/2015 FINDINGS: Support devices are stable. Moderate to severe diffuse bilateral airspace disease again noted, slightly worsened in the right upper lobe since prior study. No effusions. Heart is borderline in size. IMPRESSION: Severe bilateral airspace disease, worsening since prior study, particularly in the right upper lobe.  Electronically Signed   By: Charlett Nose M.D.   On: 06/13/2015 07:51   Dg Abd Portable 1v  07/01/2015  CLINICAL DATA:  Abdominal distension. No bowel movement since 06/26/2015 EXAM: PORTABLE ABDOMEN - 1 VIEW COMPARISON:  None. FINDINGS: Contrast material is noted throughout the colon as well as fecal material consistent with a degree of constipation. A gastrostomy catheter is noted in place. No free air is seen. No other focal abnormality is noted. IMPRESSION: Contrast fecal material throughout the colon consistent with a degree of constipation. No acute obstructive changes are noted. Electronically Signed   By: Alcide Clever M.D.   On: 07/01/2015 16:30   Dg Abd Portable 1v  06/25/2015  CLINICAL DATA:  Check gastric catheter placement EXAM: PORTABLE ABDOMEN - 1 VIEW COMPARISON:  None. FINDINGS: Nasogastric catheter is noted within the stomach. Scattered infiltrative changes are noted in the bases bilaterally. Scattered large and small bowel gas is noted. IMPRESSION: Nasogastric catheter within the stomach. Electronically Signed   By: Alcide Clever M.D.   On: 06/25/2015 18:19   Dg Abd Portable 1v  06/15/2015  CLINICAL DATA:  NG placement EXAM: PORTABLE ABDOMEN - 1 VIEW COMPARISON:  None. FINDINGS: Enteric tube terminates in the distal gastric body. Nonobstructive bowel gas pattern. Moderate degenerative changes of the lumbar spine. IMPRESSION: Enteric tube terminates in the distal gastric body. Electronically Signed   By:  Charline Bills M.D.   On: 06/15/2015 19:12   US Abdomen Limited Ruq  07/01/2015  CLINICAL DATA:  LFT elevation.  Alcohol abuse. EXAM: US ABDOMEN LIMITED - RIGHT UPPER QUADRANT COMPARISON:  None. FINDINGS: Gallbladder: No gallstones or wall thickening visualized. No sonographic Murphy sign noted by sonographer. Common bile duct: Diameter: Upper limits normal 6.5 mm. Liver: No focal lesion identified. Within normal limits in parenchymal echogenicity. IMPRESSION: No evidence for gallstones  or biliary obstruction. No focal hepatic abnormality is detected. Electronically Signed   By: Elsie Stain M.D.   On: 07/01/2015 14:01    DISPOSITION: SNF  Discharge Instructions    Call MD for:  difficulty breathing, headache or visual disturbances    Complete by:  As directed      Call MD for:  extreme fatigue    Complete by:  As directed      Call MD for:  persistant dizziness or light-headedness    Complete by:  As directed      Call MD for:  persistant nausea and vomiting    Complete by:  As directed      Call MD for:  severe uncontrolled pain    Complete by:  As directed      Call MD for:  temperature >100.4    Complete by:  As directed      Discharge instructions    Complete by:  As directed   Please check CBC and BMET next week. Please slowly taper off of narcotics. He will need Tracheostomy and PEG care. PEG tube feedings to continue.  You were cared for by a hospitalist during your hospital stay. If you have any questions about your discharge medications or the care you received while you were in the hospital after you are discharged, you can call the unit and asked to speak with the hospitalist on call if the hospitalist that took care of you is not available. Once you are discharged, your primary care physician will handle any further medical issues. Please note that NO REFILLS for any discharge medications will be authorized once you are discharged, as it is imperative that you return to your primary care physician (or establish a relationship with a primary care physician if you do not have one) for your aftercare needs so that they can reassess your need for medications and monitor your lab values. If you do not have a primary care physician, you can call [phone] for a physician referral.           ALLERGIES:  Allergies  Allergen Reactions  . Chlorhexidine Swelling     Current Discharge Medication List    START taking these medications   Details  aspirin 81 MG  chewable tablet Place 1 tablet (81 mg total) into feeding tube daily. Qty: 30 tablet, Refills: 0    diltiazem (CARDIZEM) 10 mg/ml oral suspension Place 6 mLs (60 mg total) into feeding tube every 6 (six) hours. Qty: 60 mL, Refills: 0    famotidine (PEPCID) 40 MG/5ML suspension Place 2.5 mLs (20 mg total) into feeding tube 2 (two) times daily. Qty: 50 mL, Refills: 0    fentaNYL (DURAGESIC - DOSED MCG/HR) 25 MCG/HR patch Place 1 patch (25 mcg total) onto the skin every 3 (three) days. Qty: 1 patch, Refills: 0    !! levalbuterol (XOPENEX) 0.63 MG/3ML nebulizer solution Take 3 mLs (0.63 mg total) by nebulization every 6 (six) hours as needed for wheezing or shortness of breath. Qty: 3 mL,  Refills: 12    !! levalbuterol (XOPENEX) 0.63 MG/3ML nebulizer solution Take 3 mLs (0.63 mg total) by nebulization 3 (three) times daily. Qty: 3 mL, Refills: 12    Nutritional Supplements (FEEDING SUPPLEMENT, VITAL 1.5 CAL,) LIQD Place 1,000 mLs into feeding tube continuous. Qty: 3000 mL, Refills: 0    polyethylene glycol (MIRALAX / GLYCOLAX) packet Place 17 g into feeding tube daily as needed for mild constipation. Qty: 14 each, Refills: 0    scopolamine (TRANSDERM-SCOP) 1 MG/3DAYS Place 1 patch (1.5 mg total) onto the skin every 3 (three) days. Qty: 10 patch, Refills: 12    torsemide (DEMADEX) 10 MG tablet Place 3 tablets (30 mg total) into feeding tube 2 (two) times daily. Qty: 30 tablet, Refills: 0    Water For Irrigation, Sterile (FREE WATER) SOLN Place 50 mLs into feeding tube every 8 (eight) hours.     !! - Potential duplicate medications found. Please discuss with provider.    STOP taking these medications     amLODipine (NORVASC) 5 MG tablet      aspirin EC 81 MG tablet      Cholecalciferol (VITAMIN D) 2000 units tablet        Follow-up Information    Follow up with Isabella Stalling, MD. Schedule an appointment as soon as possible for a visit in 1 week.   Specialty:  Internal  Medicine   Contact information:   [address] [place] [address] [phone]       Follow up with [facility]. Schedule an appointment as soon as possible for a visit in 1 month.   Specialty:  Neurology   Contact information:   [address] [place] [address] [phone]      Follow up with [facility]. Schedule an appointment as soon as possible for a visit in 2 weeks.   Specialty:  Pulmonology   Contact information:   [address] [place] [address] [phone]      TOTAL DISCHARGE TIME: 35 mins  [facility]  [facility] Pager [phone]  07/12/2015, 11:53 AM

## 2015-07-12 NOTE — Clinical Social Work Placement (Signed)
   CLINICAL SOCIAL WORK PLACEMENT  NOTE  Date:  07/12/2015  Patient Details  Name: Gregory Parker MRN: [id number] Date of Birth: Nov 23, 1955  Clinical Social Work is seeking post-discharge placement for this patient at the Skilled  Nursing Facility level of care (*CSW will initial, date and re-position this form in  chart as items are completed):  Yes   Patient/family provided with [facility] Clinical Social Work Department's list of facilities offering this level of care within the geographic area requested by the patient (or if unable, by the patient's family).  Yes   Patient/family informed of their freedom to choose among providers that offer the needed level of care, that participate in Medicare, Medicaid or managed care program needed by the patient, have an available bed and are willing to accept the patient.  Yes   Patient/family informed of [facility]'s ownership interest in [facility] and [facility], as well as of the fact that they are under no obligation to receive care at these facilities.  PASRR submitted to EDS on 07/09/15     PASRR number received on 07/09/15     Existing PASRR number confirmed on       FL2 transmitted to all facilities in geographic area requested by pt/family on 07/09/15     FL2 transmitted to all facilities within larger geographic area on       Patient informed that his/her managed care company has contracts with or will negotiate with certain facilities, including the following:        Yes   Patient/family informed of bed offers received.  [facility]     Physician recommends and patient chooses bed at      [facility] on 07/12/15.  Patient to be transferred to facility by [facility]     Patient family notified on 07/12/15 of transfer.  Name of family member notified:  Carney BernJean     PHYSICIAN Please sign FL2     Additional Comment:     _______________________________________________ Izora RibasHoloman, Armoni Depass M, LCSW 07/12/2015, 12:52 PM

## 2015-07-12 NOTE — Progress Notes (Signed)
Patient will [facility] to [facility] Anticipated discharge date: 4/6 Family notified: Carney BernJean (sister) [facility] by [facility]- called at 12:50pm  CSW signing off.  Merlyn LotJenna Holoman, LCSWA Clinical Social Worker [phone]

## 2015-07-13 ENCOUNTER — Encounter (HOSPITAL_COMMUNITY): Payer: Self-pay | Admitting: *Deleted

## 2015-08-06 DEATH — deceased

## 2016-10-28 IMAGING — CT CT ABDOMEN W/O CM
2 of 4 series · 15 of 46 positions shown, 17 images · non-contrast
Comparison: None.

CLINICAL DATA: 59-year-old male with a history of dysphagia.

EXAM:
CT ABDOMEN WITHOUT CONTRAST
TECHNIQUE: Multidetector CT imaging of the abdomen was performed following the
standard protocol without IV contrast.

[Series 201: routine, idose (2) · axial · 0.64mm/px · z∈[+157,+397]mm · 12 of 54 slices shown, 14 images]
[im 3/54  soft-tissue]
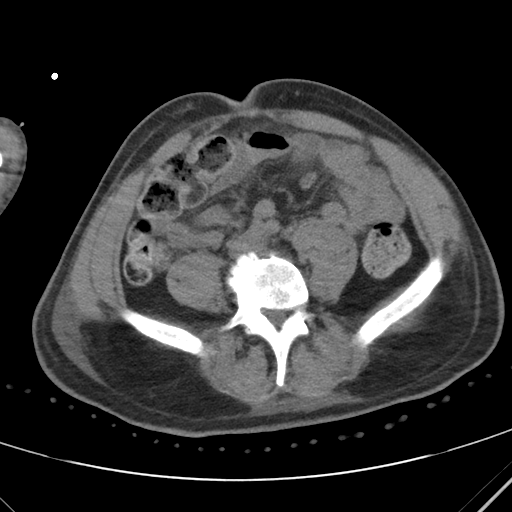
[im 3/54  bone]
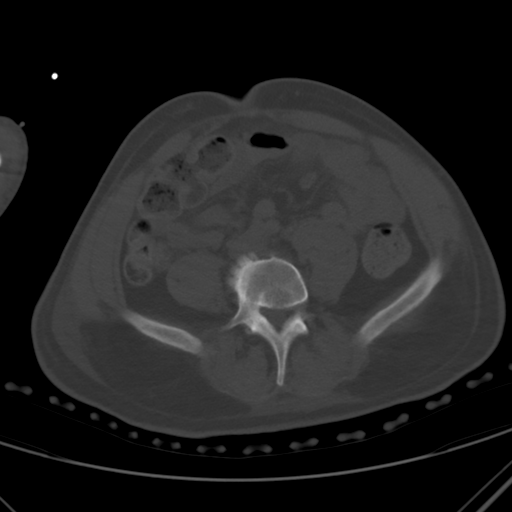
[im 8/54  soft-tissue]
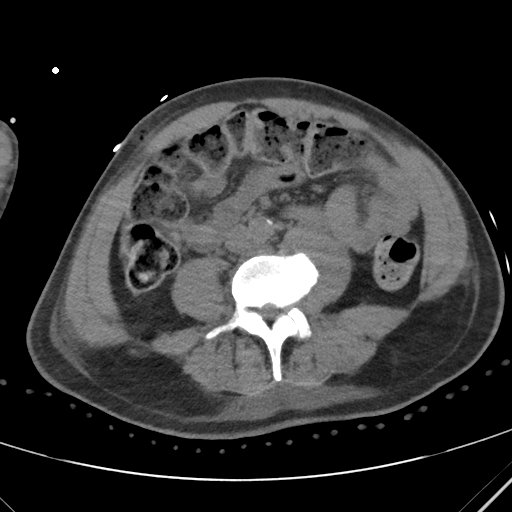
[im 11/54  soft-tissue]
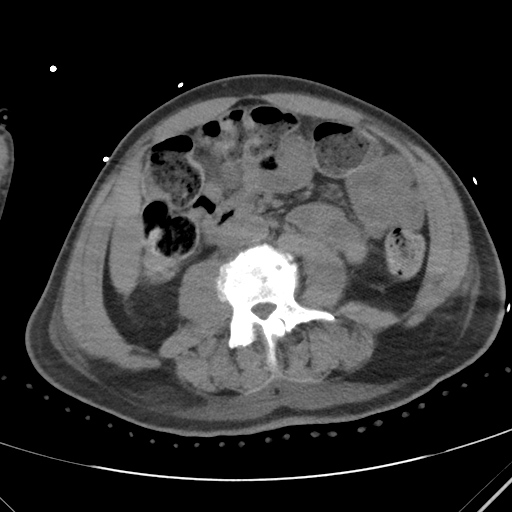
[im 16/54  soft-tissue]
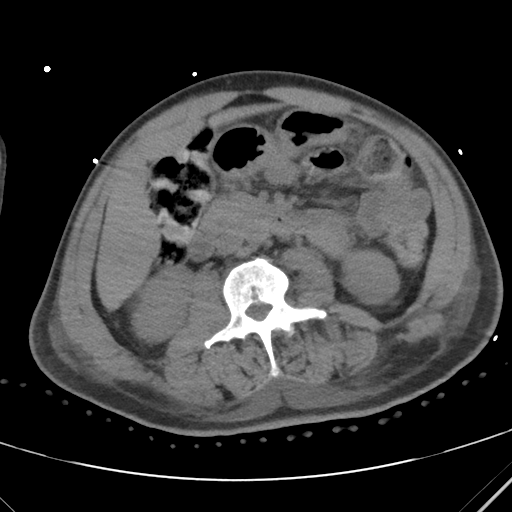
[im 22/54  soft-tissue]
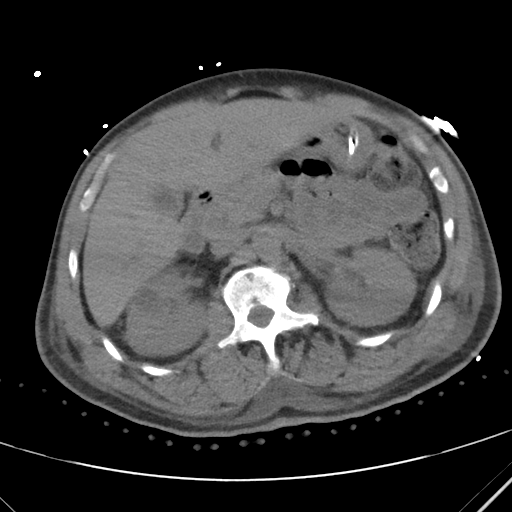
[im 24/54  soft-tissue]
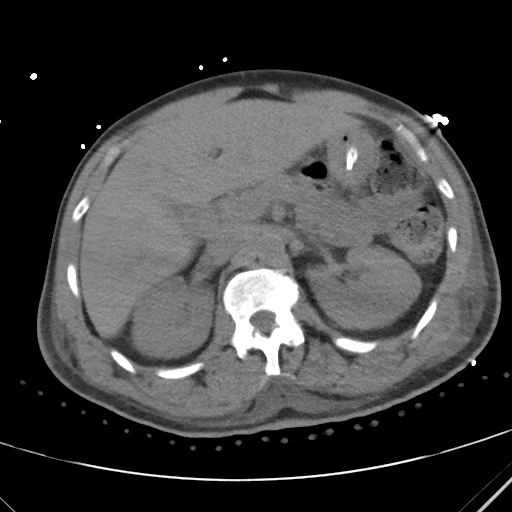
[im 30/54  soft-tissue]
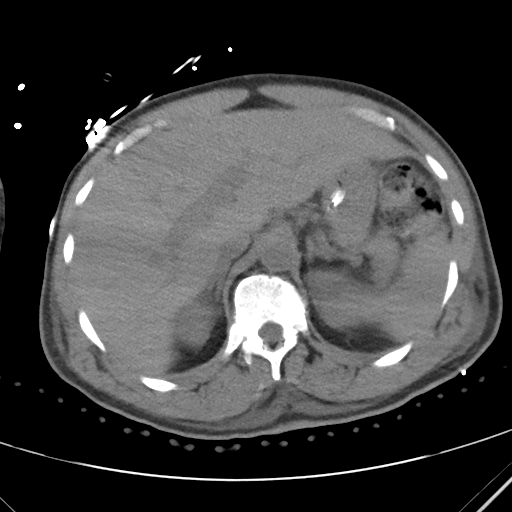
[im 32/54  soft-tissue]
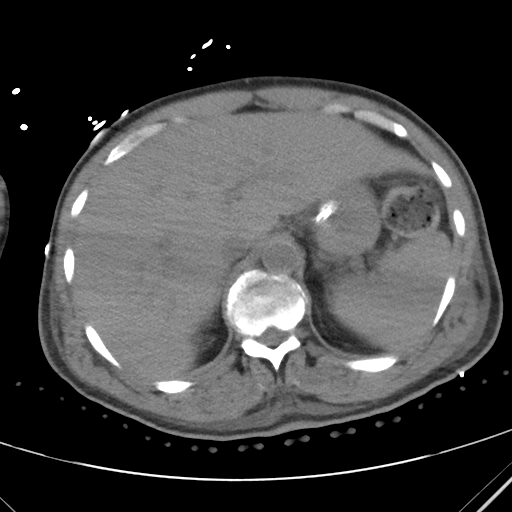
[im 38/54  soft-tissue]
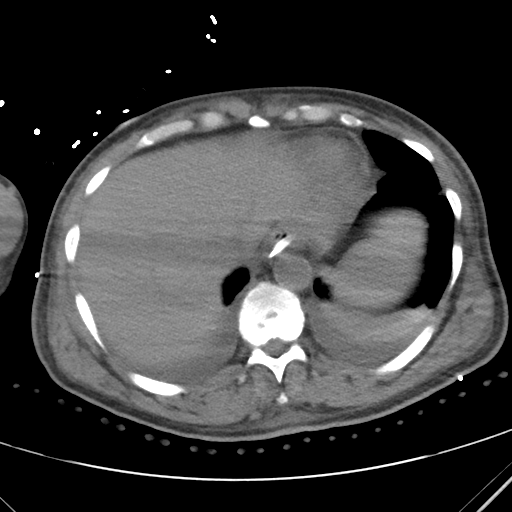
[im 38/54  bone]
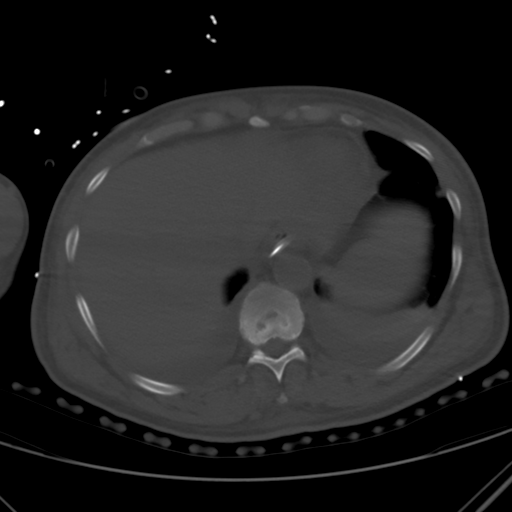
[im 43/54  soft-tissue]
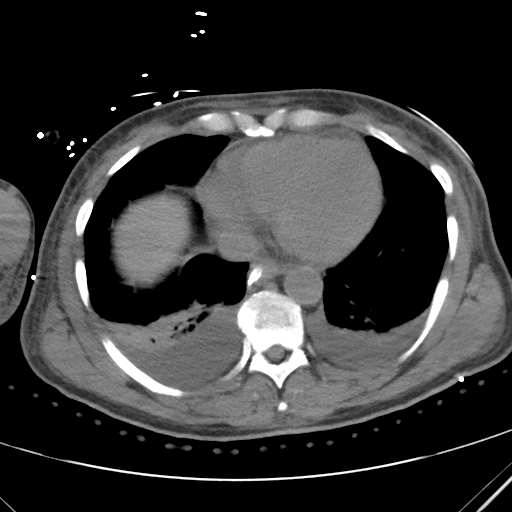
[im 46/54  soft-tissue]
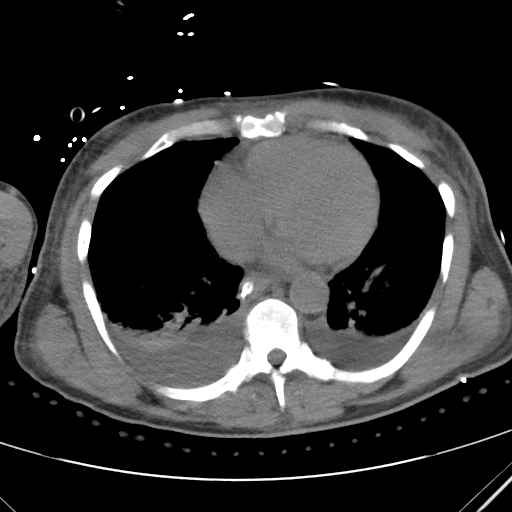
[im 51/54  soft-tissue]
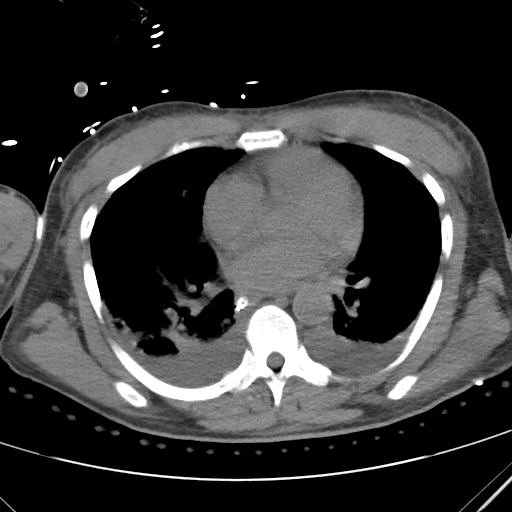

[Series 203: coronals, idose (2) · coronal · 0.45mm/px · 3 of 106 slices shown]
[im 36/106  soft-tissue]
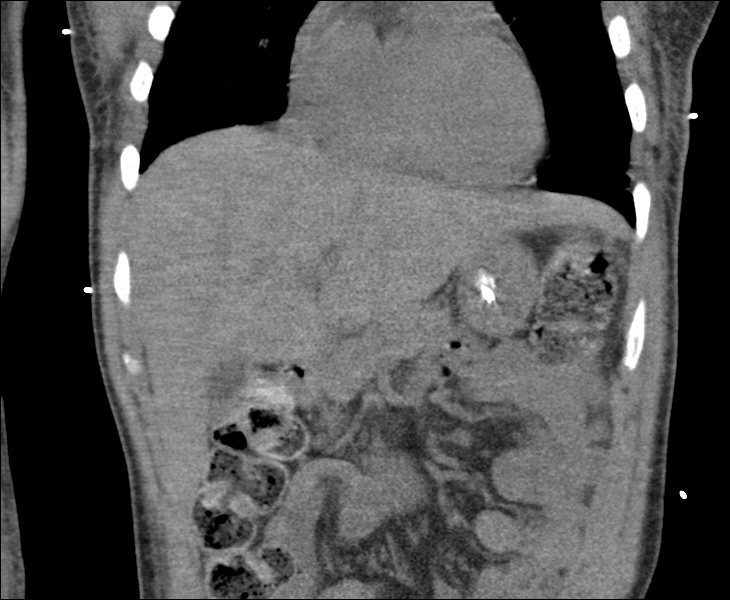
[im 47/106  soft-tissue]
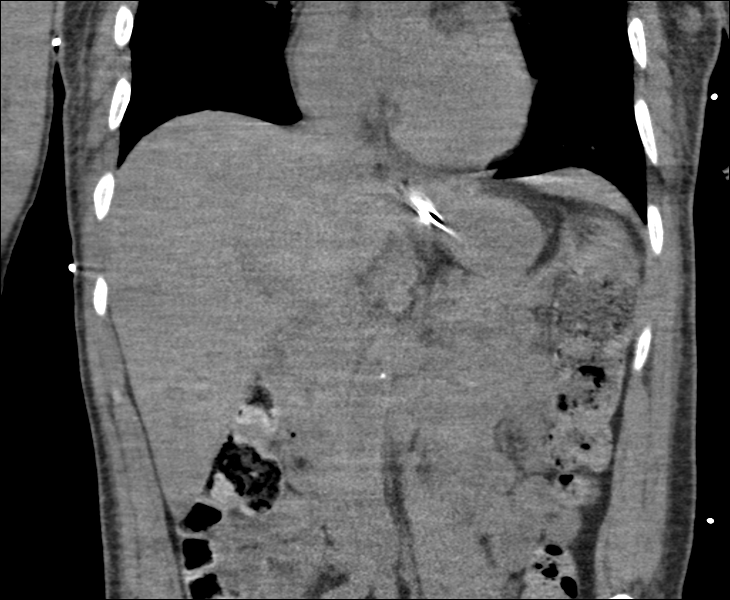
[im 59/106  soft-tissue]
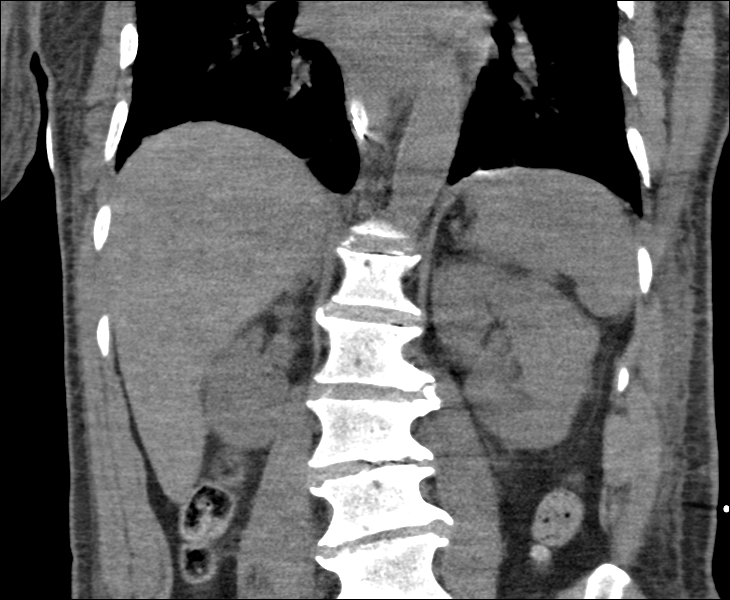

[15 of 46 positions shown; findings below may reference images not displayed]

FINDINGS: Lower chest:

Edema/ anasarca of the lower chest wall soft tissues. Adenopathy
along the left chest wall.

Minimal gynecomastia.

Bilateral small pleural effusions with associated atelectasis.

Gastric tube within the esophagus, terminating within the stomach.

Heart size within normal limits without significant pericardial
fluid/ thickening.

Abdomen/pelvis:

Unremarkable appearance of the liver.

Gallbladder is contracted with no radiopaque stones. No
pericholecystic fluid.

Unremarkable spleen.

Unremarkable bilateral adrenal glands.

Unremarkable pancreas.

Visualized colon and small bowel unremarkable, with no abnormal
distention.

Similar to the chest wall there is vague infiltration of the
abdominal mesenteric. No significant ascites.

No hydronephrosis or nephrolithiasis of the visualized kidneys.

Hypodense structure or on the lateral cortex of the right kidney and
the inferior cortex of the right kidney, both with low Hounsfield
units. Superior measures 1.5 cm. Inferior measures 1.4 cm.

Scattered vascular calcifications.

Degenerative changes of the spine. No acute fracture. Multiple
endplate irregularities compatible with Schmorl's nodes. Vacuum disc
phenomenon of the mid lumbar spine. No significant bony canal
narrowing.
IMPRESSION: No acute intra abdominal process.

Bilateral pleural effusions and atelectasis.

Body wall and abdominal edema/ anasarca. Recommend correlation with
fluid status.

Right-sided low-density kidney lesions, most likely benign cysts
though not fully characterized by noncontrast CT.

## 2016-11-02 IMAGING — CR DG ABD PORTABLE 1V
1 series · 1 of 1 positions shown · non-contrast
Comparison: None.

CLINICAL DATA: Check gastric catheter placement

EXAM:
PORTABLE ABDOMEN - 1 VIEW

[AP]
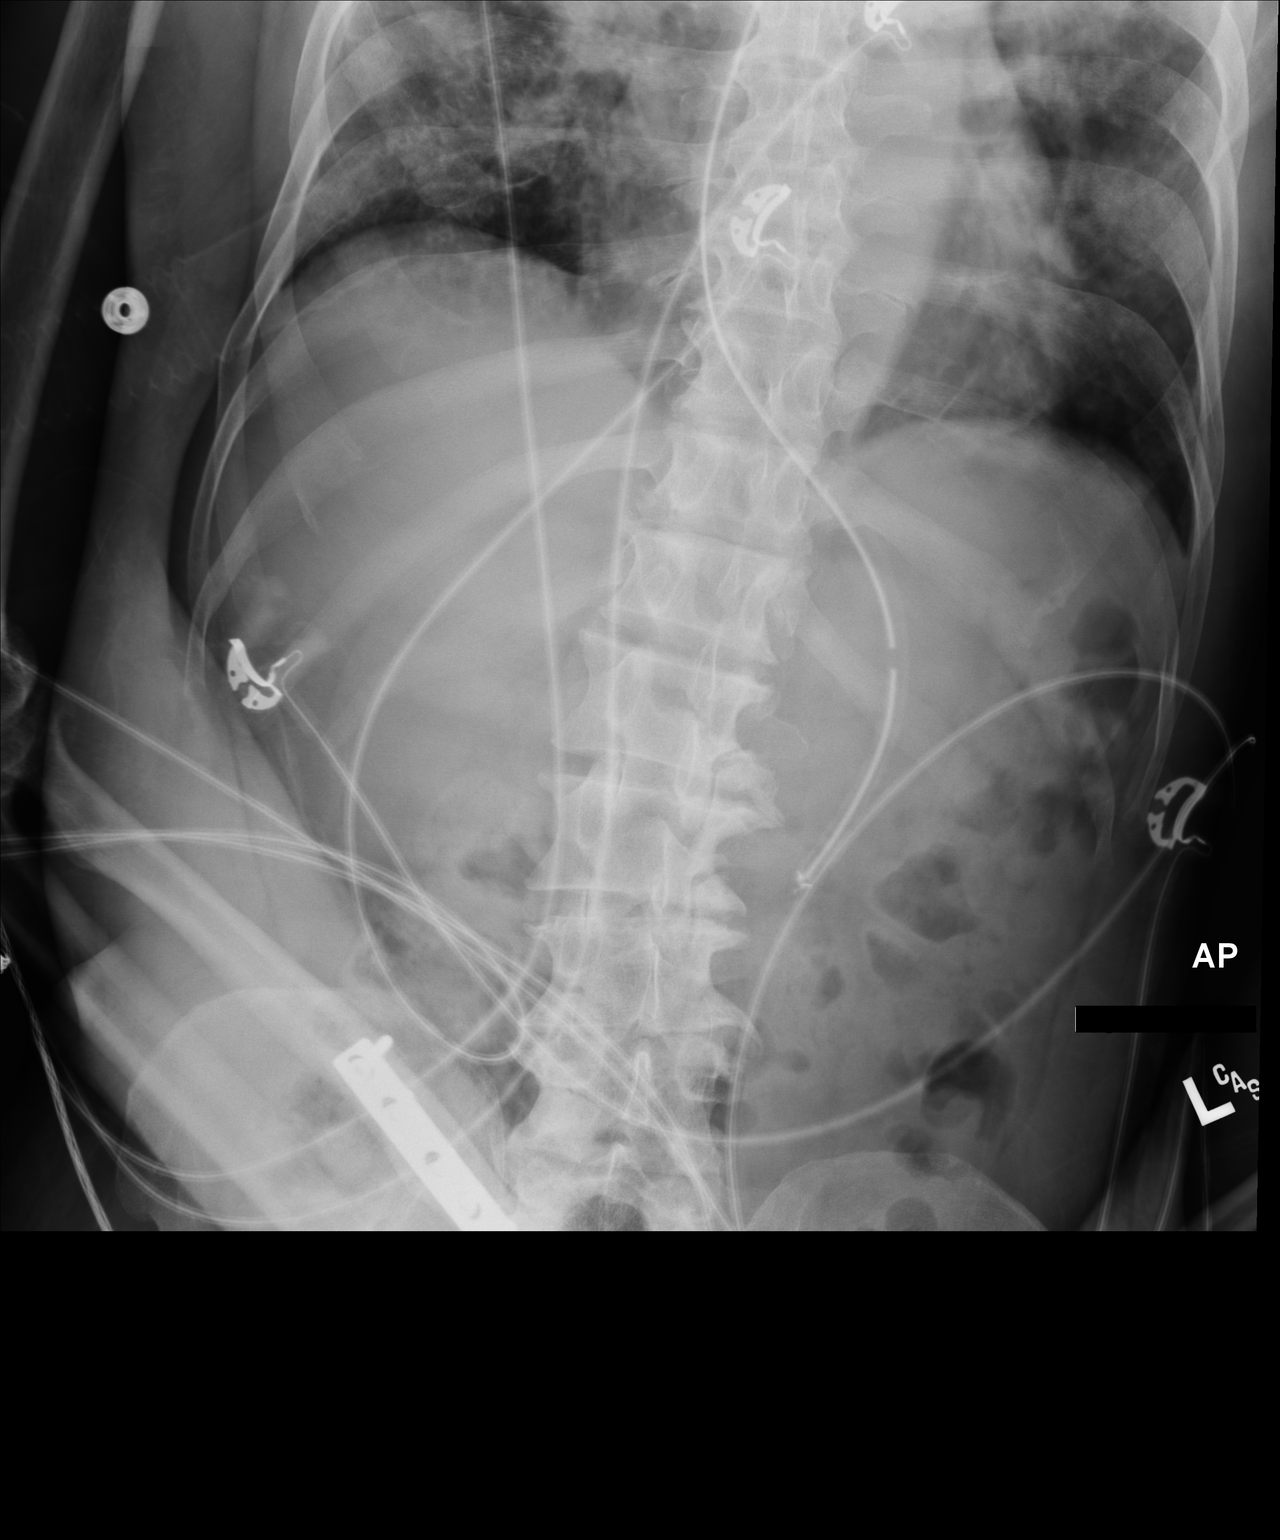

[1 of 1 positions shown; findings below may reference images not displayed]

FINDINGS: Nasogastric catheter is noted within the stomach. Scattered
infiltrative changes are noted in the bases bilaterally. Scattered
large and small bowel gas is noted.
IMPRESSION: Nasogastric catheter within the stomach.

## 2016-11-03 IMAGING — XA IR PERC PLACEMENT GASTROSTOMY
1 series · 6 of 6 positions shown · non-contrast
Comparison: CT abdomen pelvis - 06/19/2025

INDICATION: History of stroke, now with dysphagia. Please perform percutaneous
gastrostomy tube placement for enteric nutrition supplementation

EXAM:
PUSH GASTROSTOMY TUBE PLACEMENT

[Series 300: dsa body · 6 of 6 slices shown]
[im 1/6]
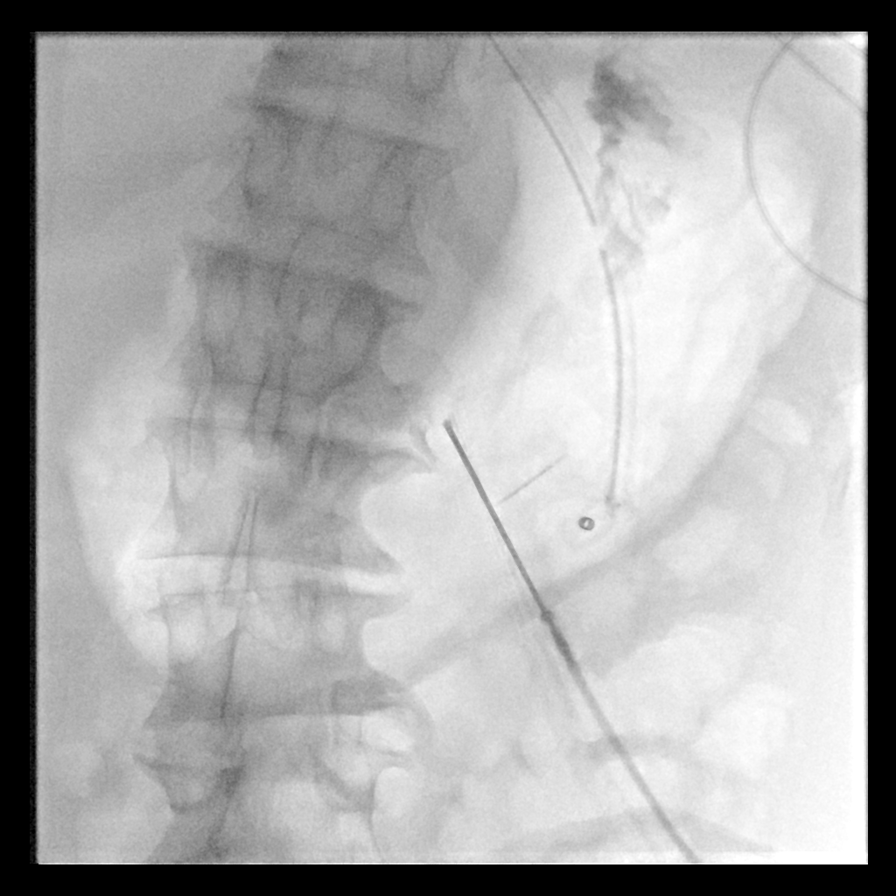
[im 2/6]
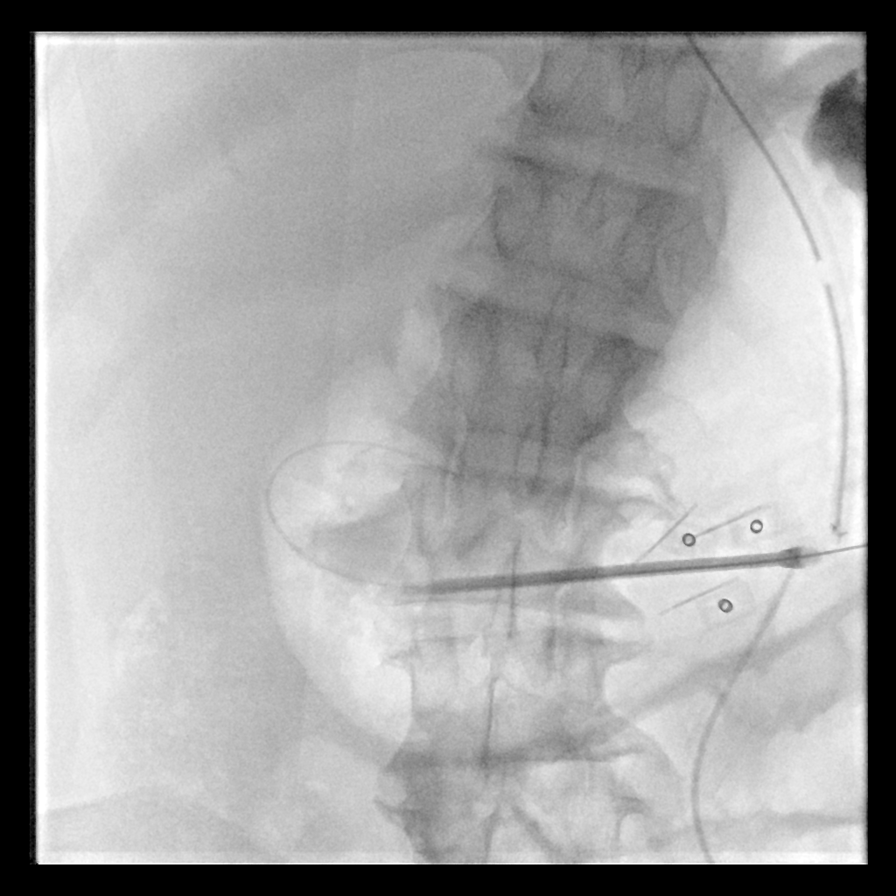
[im 3/6]
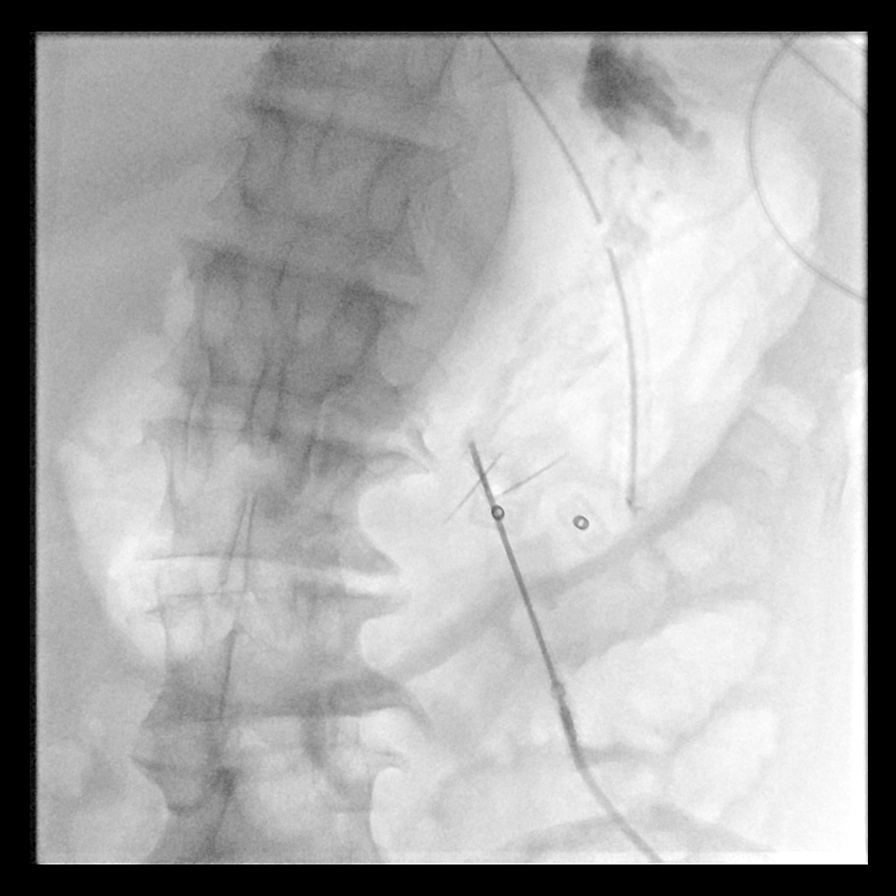
[im 4/6]
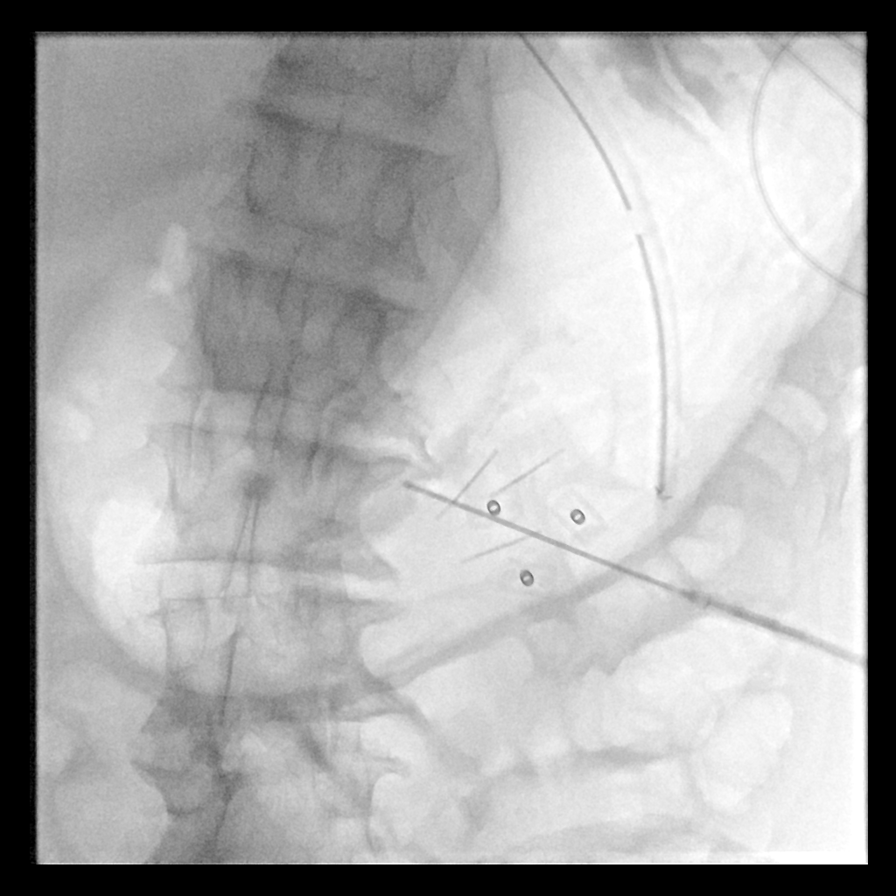
[im 5/6]
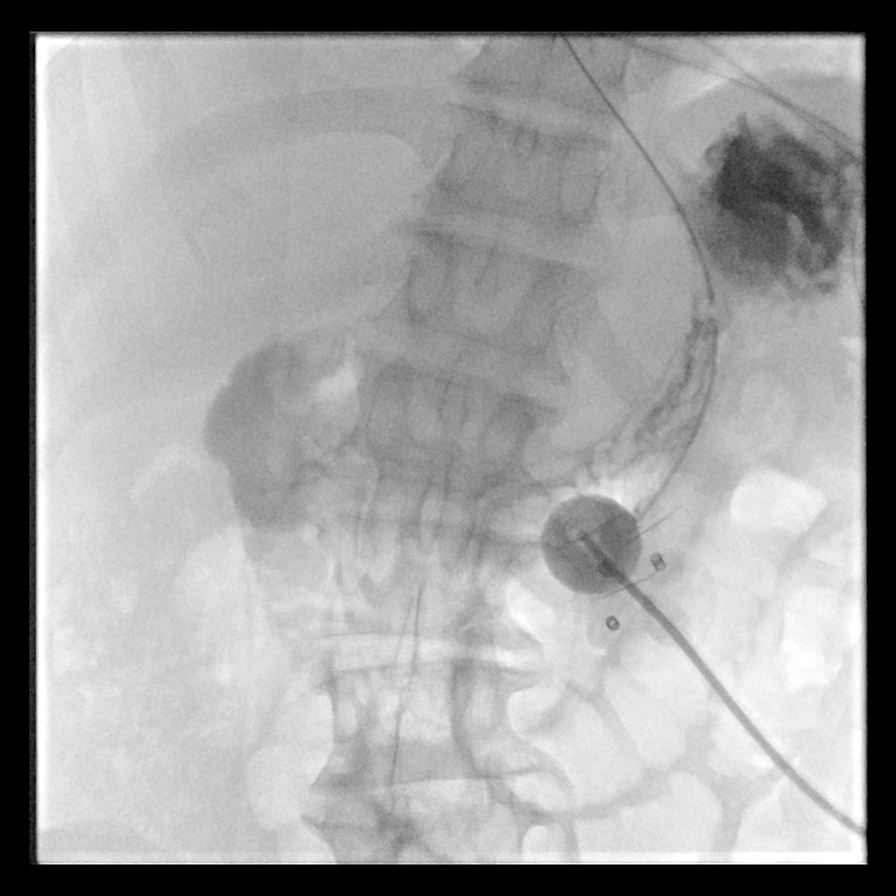
[im 6/6]
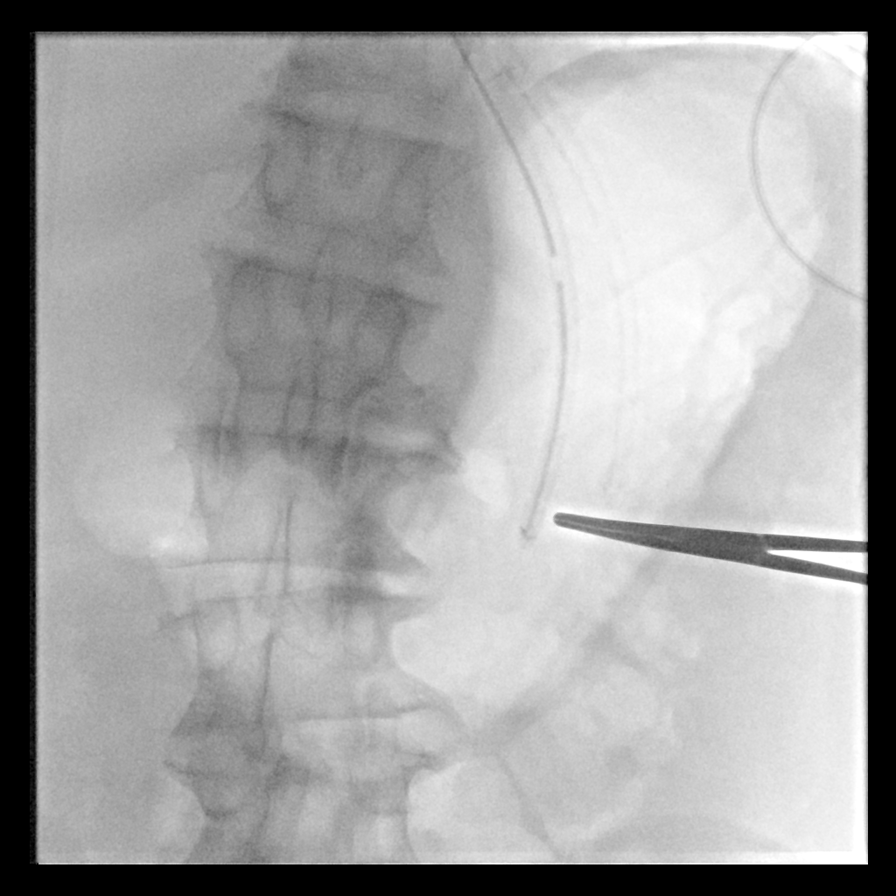

[6 of 6 positions shown; findings below may reference images not displayed]

MEDICATIONS:
Ancef 2 gm IV; Antibiotics were administered within 1 hour of the
procedure.

CONTRAST:  50 mL of Isovue 300 administered into the gastric lumen.

ANESTHESIA/SEDATION:
Moderate (conscious) sedation was employed during this procedure. A
total of Versed 2 mg and Fentanyl 100 mcg was administered
intravenously.

Moderate Sedation Time: 25 minutes. The patient's level of
consciousness and vital signs were monitored continuously by
radiology nursing throughout the procedure under my direct
supervision.

FLUOROSCOPY TIME:  10 minutes 42 seconds (140 mGy)

COMPLICATIONS:
None immediate.

PROCEDURE:
Informed written consent was obtained from the patient's family
following explanation of the procedure, risks, benefits and
alternatives. A time out was performed prior to the initiation of
the procedure. Ultrasound scanning was performed to demarcate the
edge of the left lobe of the liver. Maximal barrier sterile
technique utilized including caps, mask, sterile gowns, sterile
gloves, large sterile drape, hand hygiene and Betadine prep.

The left upper quadrant was sterilely prepped and draped. A oral
gastric catheter was inserted into the stomach under fluoroscopy.
The existing nasogastric feeding tube was removed. The left costal
margin and air opacified transverse colon were identified and
avoided. Air was injected into the stomach for insufflation and
visualization under fluoroscopy. Under sterile conditions and local
anesthesia, 3 T tacks were utilized to pexy the anterior aspect of
the stomach against the ventral abdominal wall. Contrast injection
confirmed appropriate positioning of each of the T tacks. An
incision was made between the T tacks and a 17 gauge trocar needle
was utilized to access the stomach. Needle position was confirmed
within the stomach with aspiration of air and injection of a small
amount of contrast. A stiff Glidewire was advanced into the gastric
lumen and under intermittent fluoroscopic guidance, the access
needle was exchanged for a Kumpe catheter. With the use of the Kumpe
catheter, attempts were made to advance a stiff Glidewire into
duodenum, however this ultimately proved unsuccessful. Under
intermittent fluoroscopic guidance, the Kumpe catheter was exchanged
for serial dilators, ultimately allowing placement of a 20 French
peel-away sheath. Under intermittent fluoroscopic guidance, a
18-French balloon retention gastrostomy tube was inserted through
the peel-away sheath. The retention balloon was insufflated with a
mixture of dilute saline and contrast and pulled taut against the
anterior wall of the stomach. The external disc was cinched.
Contrast injection confirms positioning within the stomach. Several
spot radiographic images were obtained in various obliquities for
documentation. T he patient tolerated procedure well without
immediate post procedural complication.
FINDINGS: After successful fluoroscopic guided placement, the gastrostomy tube
is appropriately positioned with internal retention balloon against
the ventral aspect of the gastric lumen.
IMPRESSION: Successful fluoroscopic insertion of an 18 French balloon retention
gastrostomy tube.

The gastrostomy may be used immediately for medication
administration and in 24 hrs for the initiation of feeds.
